# Patient Record
Sex: Female | Born: 1960 | Race: White | Hispanic: No | Marital: Married | Smoking: Former smoker
Health system: Southern US, Community
[De-identification: ages and names within clinical notes are randomized; demographics above are authoritative.]

## PROBLEM LIST (undated history)

## (undated) DIAGNOSIS — T7840XA Allergy, unspecified, initial encounter: Secondary | ICD-10-CM

## (undated) DIAGNOSIS — E079 Disorder of thyroid, unspecified: Secondary | ICD-10-CM

## (undated) DIAGNOSIS — E039 Hypothyroidism, unspecified: Secondary | ICD-10-CM

## (undated) DIAGNOSIS — C50919 Malignant neoplasm of unspecified site of unspecified female breast: Secondary | ICD-10-CM

## (undated) DIAGNOSIS — K219 Gastro-esophageal reflux disease without esophagitis: Secondary | ICD-10-CM

## (undated) DIAGNOSIS — F32A Depression, unspecified: Secondary | ICD-10-CM

## (undated) DIAGNOSIS — E119 Type 2 diabetes mellitus without complications: Secondary | ICD-10-CM

## (undated) DIAGNOSIS — M1990 Unspecified osteoarthritis, unspecified site: Secondary | ICD-10-CM

## (undated) DIAGNOSIS — F329 Major depressive disorder, single episode, unspecified: Secondary | ICD-10-CM

## (undated) DIAGNOSIS — F419 Anxiety disorder, unspecified: Secondary | ICD-10-CM

## (undated) HISTORY — DX: Disorder of thyroid, unspecified: E07.9

## (undated) HISTORY — DX: Allergy, unspecified, initial encounter: T78.40XA

## (undated) HISTORY — PX: BREAST SURGERY: SHX581

## (undated) HISTORY — DX: Depression, unspecified: F32.A

## (undated) HISTORY — PX: COLONOSCOPY: SHX174

## (undated) HISTORY — PX: TONSILLECTOMY: SUR1361

## (undated) HISTORY — PX: ABDOMINAL HYSTERECTOMY: SHX81

## (undated) HISTORY — DX: Type 2 diabetes mellitus without complications: E11.9

## (undated) HISTORY — DX: Malignant neoplasm of unspecified site of unspecified female breast: C50.919

## (undated) HISTORY — DX: Anxiety disorder, unspecified: F41.9

## (undated) HISTORY — PX: POLYPECTOMY: SHX149

## (undated) HISTORY — DX: Unspecified osteoarthritis, unspecified site: M19.90

## (undated) HISTORY — PX: BACK SURGERY: SHX140

---
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
## 1998-12-03 ENCOUNTER — Other Ambulatory Visit: Admission: RE | Admit: 1998-12-03 | Discharge: 1998-12-03 | Payer: Self-pay | Admitting: Family Medicine

## 1999-10-09 ENCOUNTER — Encounter: Payer: Self-pay | Admitting: Emergency Medicine

## 1999-10-09 ENCOUNTER — Emergency Department (HOSPITAL_COMMUNITY): Admission: EM | Admit: 1999-10-09 | Discharge: 1999-10-09 | Payer: Self-pay | Admitting: Emergency Medicine

## 2001-01-18 ENCOUNTER — Encounter: Admission: RE | Admit: 2001-01-18 | Discharge: 2001-01-18 | Payer: Self-pay | Admitting: Internal Medicine

## 2001-01-18 ENCOUNTER — Encounter: Payer: Self-pay | Admitting: Internal Medicine

## 2002-08-07 ENCOUNTER — Encounter: Payer: Self-pay | Admitting: Internal Medicine

## 2002-08-07 ENCOUNTER — Encounter: Admission: RE | Admit: 2002-08-07 | Discharge: 2002-08-07 | Payer: Self-pay | Admitting: Internal Medicine

## 2005-05-09 DIAGNOSIS — C50919 Malignant neoplasm of unspecified site of unspecified female breast: Secondary | ICD-10-CM

## 2005-05-09 HISTORY — DX: Malignant neoplasm of unspecified site of unspecified female breast: C50.919

## 2005-05-09 HISTORY — PX: MASTECTOMY: SHX3

## 2005-05-10 ENCOUNTER — Encounter: Admission: RE | Admit: 2005-05-10 | Discharge: 2005-05-10 | Payer: Self-pay | Admitting: Internal Medicine

## 2005-05-17 ENCOUNTER — Encounter: Admission: RE | Admit: 2005-05-17 | Discharge: 2005-05-17 | Payer: Self-pay | Admitting: Internal Medicine

## 2005-05-17 ENCOUNTER — Encounter (INDEPENDENT_AMBULATORY_CARE_PROVIDER_SITE_OTHER): Payer: Self-pay | Admitting: Diagnostic Radiology

## 2005-05-17 ENCOUNTER — Encounter (INDEPENDENT_AMBULATORY_CARE_PROVIDER_SITE_OTHER): Payer: Self-pay | Admitting: Specialist

## 2005-05-25 ENCOUNTER — Encounter: Admission: RE | Admit: 2005-05-25 | Discharge: 2005-05-25 | Payer: Self-pay | Admitting: General Surgery

## 2005-05-30 ENCOUNTER — Encounter: Admission: RE | Admit: 2005-05-30 | Discharge: 2005-05-30 | Payer: Self-pay | Admitting: General Surgery

## 2005-05-30 ENCOUNTER — Encounter (INDEPENDENT_AMBULATORY_CARE_PROVIDER_SITE_OTHER): Payer: Self-pay | Admitting: *Deleted

## 2005-06-21 ENCOUNTER — Inpatient Hospital Stay (HOSPITAL_COMMUNITY): Admission: RE | Admit: 2005-06-21 | Discharge: 2005-06-22 | Payer: Self-pay | Admitting: General Surgery

## 2005-06-21 ENCOUNTER — Encounter (INDEPENDENT_AMBULATORY_CARE_PROVIDER_SITE_OTHER): Payer: Self-pay | Admitting: *Deleted

## 2005-07-07 ENCOUNTER — Ambulatory Visit: Payer: Self-pay | Admitting: Oncology

## 2005-07-22 ENCOUNTER — Encounter (INDEPENDENT_AMBULATORY_CARE_PROVIDER_SITE_OTHER): Payer: Self-pay | Admitting: Specialist

## 2005-07-22 ENCOUNTER — Inpatient Hospital Stay (HOSPITAL_COMMUNITY): Admission: RE | Admit: 2005-07-22 | Discharge: 2005-07-24 | Payer: Self-pay | Admitting: Plastic Surgery

## 2005-08-29 ENCOUNTER — Ambulatory Visit: Payer: Self-pay | Admitting: Oncology

## 2005-08-29 LAB — CBC WITH DIFFERENTIAL/PLATELET
Basophils Absolute: 0 10*3/uL (ref 0.0–0.1)
Eosinophils Absolute: 0.1 10*3/uL (ref 0.0–0.5)
HCT: 39.3 % (ref 34.8–46.6)
HGB: 13.2 g/dL (ref 11.6–15.9)
MCH: 29.3 pg (ref 26.0–34.0)
MCV: 86.9 fL (ref 81.0–101.0)
NEUT#: 4.1 10*3/uL (ref 1.5–6.5)
NEUT%: 65.6 % (ref 39.6–76.8)
RDW: 12.3 % (ref 11.3–14.5)
lymph#: 1.6 10*3/uL (ref 0.9–3.3)

## 2005-08-29 LAB — COMPREHENSIVE METABOLIC PANEL
Albumin: 4.6 g/dL (ref 3.5–5.2)
BUN: 13 mg/dL (ref 6–23)
Calcium: 9.4 mg/dL (ref 8.4–10.5)
Chloride: 105 mEq/L (ref 96–112)
Creatinine, Ser: 0.8 mg/dL (ref 0.4–1.2)
Glucose, Bld: 98 mg/dL (ref 70–99)
Potassium: 4.2 mEq/L (ref 3.5–5.3)

## 2005-11-23 ENCOUNTER — Ambulatory Visit: Payer: Self-pay | Admitting: Oncology

## 2006-02-10 ENCOUNTER — Ambulatory Visit: Payer: Self-pay | Admitting: Oncology

## 2006-03-31 ENCOUNTER — Ambulatory Visit: Payer: Self-pay | Admitting: Family Medicine

## 2006-05-11 ENCOUNTER — Encounter: Admission: RE | Admit: 2006-05-11 | Discharge: 2006-05-11 | Payer: Self-pay | Admitting: Family Medicine

## 2006-05-19 ENCOUNTER — Ambulatory Visit: Payer: Self-pay | Admitting: Family Medicine

## 2006-05-19 LAB — CONVERTED CEMR LAB
Albumin: 4 g/dL (ref 3.5–5.2)
BUN: 11 mg/dL (ref 6–23)
Basophils Absolute: 0 10*3/uL (ref 0.0–0.1)
Calcium: 9.6 mg/dL (ref 8.4–10.5)
Eosinophil percent: 1.5 % (ref 0.0–5.0)
GFR calc non Af Amer: 82 mL/min
HCT: 38.6 % (ref 36.0–46.0)
MCHC: 33.6 g/dL (ref 30.0–36.0)
Monocytes Relative: 7.2 % (ref 3.0–11.0)
Neutro Abs: 5.2 10*3/uL (ref 1.4–7.7)
Neutrophils Relative %: 66.6 % (ref 43.0–77.0)
Potassium: 4 meq/L (ref 3.5–5.1)
Sodium: 143 meq/L (ref 135–145)
Total Bilirubin: 0.8 mg/dL (ref 0.3–1.2)
Total Protein: 6.7 g/dL (ref 6.0–8.3)
WBC: 7.6 10*3/uL (ref 4.5–10.5)

## 2006-06-07 ENCOUNTER — Encounter: Admission: RE | Admit: 2006-06-07 | Discharge: 2006-06-07 | Payer: Self-pay | Admitting: Oncology

## 2006-07-24 ENCOUNTER — Other Ambulatory Visit: Admission: RE | Admit: 2006-07-24 | Discharge: 2006-07-24 | Payer: Self-pay | Admitting: Family Medicine

## 2006-07-24 ENCOUNTER — Ambulatory Visit: Payer: Self-pay | Admitting: Family Medicine

## 2006-07-24 ENCOUNTER — Encounter (INDEPENDENT_AMBULATORY_CARE_PROVIDER_SITE_OTHER): Payer: Self-pay | Admitting: Family Medicine

## 2006-07-24 LAB — CONVERTED CEMR LAB
HDL: 53.6 mg/dL (ref >39.0)
Triglycerides: 64 mg/dL (ref 0–149)
VLDL: 13 mg/dL (ref 0–40)

## 2006-08-02 ENCOUNTER — Ambulatory Visit: Payer: Self-pay | Admitting: Family Medicine

## 2006-08-02 LAB — CONVERTED CEMR LAB: T3, Free: 3.5 pg/mL (ref 2.3–4.2)

## 2006-08-31 ENCOUNTER — Ambulatory Visit: Payer: Self-pay | Admitting: Endocrinology

## 2006-09-06 DIAGNOSIS — Z9010 Acquired absence of unspecified breast and nipple: Secondary | ICD-10-CM

## 2006-09-06 DIAGNOSIS — Z9079 Acquired absence of other genital organ(s): Secondary | ICD-10-CM | POA: Insufficient documentation

## 2006-09-06 DIAGNOSIS — F411 Generalized anxiety disorder: Secondary | ICD-10-CM

## 2006-09-11 ENCOUNTER — Encounter: Admission: RE | Admit: 2006-09-11 | Discharge: 2006-09-11 | Payer: Self-pay | Admitting: Endocrinology

## 2006-09-12 ENCOUNTER — Ambulatory Visit: Payer: Self-pay | Admitting: Oncology

## 2006-09-14 LAB — COMPREHENSIVE METABOLIC PANEL
AST: 16 U/L (ref 0–37)
Albumin: 4.4 g/dL (ref 3.5–5.2)
BUN: 10 mg/dL (ref 6–23)
CO2: 23 mEq/L (ref 19–32)
Calcium: 9.1 mg/dL (ref 8.4–10.5)
Chloride: 108 mEq/L (ref 96–112)
Glucose, Bld: 95 mg/dL (ref 70–99)
Potassium: 3.9 mEq/L (ref 3.5–5.3)

## 2006-09-14 LAB — CBC WITH DIFFERENTIAL/PLATELET
Basophils Absolute: 0 10*3/uL (ref 0.0–0.1)
EOS%: 1.9 % (ref 0.0–7.0)
Eosinophils Absolute: 0.1 10*3/uL (ref 0.0–0.5)
HCT: 34.8 % (ref 34.8–46.6)
HGB: 12.2 g/dL (ref 11.6–15.9)
MONO#: 0.7 10*3/uL (ref 0.1–0.9)
NEUT#: 4.6 10*3/uL (ref 1.5–6.5)
RDW: 12.3 % (ref 11.3–14.5)
WBC: 7.1 10*3/uL (ref 3.9–10.0)
lymph#: 1.7 10*3/uL (ref 0.9–3.3)

## 2006-09-14 LAB — CANCER ANTIGEN 27.29: CA 27.29: 19 U/mL (ref 0–39)

## 2006-09-14 LAB — LACTATE DEHYDROGENASE: LDH: 132 U/L (ref 94–250)

## 2006-09-29 ENCOUNTER — Encounter: Admission: RE | Admit: 2006-09-29 | Discharge: 2006-09-29 | Payer: Self-pay | Admitting: Endocrinology

## 2006-10-31 ENCOUNTER — Ambulatory Visit: Payer: Self-pay | Admitting: Endocrinology

## 2006-10-31 LAB — CONVERTED CEMR LAB
Free T4: 2 ng/dL — ABNORMAL HIGH (ref 0.6–1.6)
TSH: 0.05 microintl units/mL — ABNORMAL LOW (ref 0.35–5.50)

## 2006-11-28 ENCOUNTER — Ambulatory Visit: Payer: Self-pay | Admitting: Endocrinology

## 2006-11-28 LAB — CONVERTED CEMR LAB: TSH: 0.08 microintl units/mL — ABNORMAL LOW (ref 0.35–5.50)

## 2006-12-26 ENCOUNTER — Ambulatory Visit: Payer: Self-pay | Admitting: Endocrinology

## 2007-01-06 ENCOUNTER — Encounter (INDEPENDENT_AMBULATORY_CARE_PROVIDER_SITE_OTHER): Payer: Self-pay | Admitting: Family Medicine

## 2007-02-03 ENCOUNTER — Encounter: Payer: Self-pay | Admitting: *Deleted

## 2007-02-03 DIAGNOSIS — Z853 Personal history of malignant neoplasm of breast: Secondary | ICD-10-CM | POA: Insufficient documentation

## 2007-02-03 DIAGNOSIS — F329 Major depressive disorder, single episode, unspecified: Secondary | ICD-10-CM | POA: Insufficient documentation

## 2007-02-07 ENCOUNTER — Ambulatory Visit: Payer: Self-pay | Admitting: Endocrinology

## 2007-02-07 ENCOUNTER — Encounter: Payer: Self-pay | Admitting: Endocrinology

## 2007-02-09 ENCOUNTER — Ambulatory Visit: Payer: Self-pay | Admitting: Internal Medicine

## 2007-02-20 ENCOUNTER — Encounter: Payer: Self-pay | Admitting: Endocrinology

## 2007-03-08 ENCOUNTER — Ambulatory Visit: Payer: Self-pay | Admitting: Oncology

## 2007-03-15 ENCOUNTER — Telehealth (INDEPENDENT_AMBULATORY_CARE_PROVIDER_SITE_OTHER): Payer: Self-pay | Admitting: *Deleted

## 2007-04-03 ENCOUNTER — Ambulatory Visit: Payer: Self-pay | Admitting: Family Medicine

## 2007-05-28 ENCOUNTER — Encounter: Admission: RE | Admit: 2007-05-28 | Discharge: 2007-05-28 | Payer: Self-pay | Admitting: Oncology

## 2007-06-04 ENCOUNTER — Encounter: Admission: RE | Admit: 2007-06-04 | Discharge: 2007-06-04 | Payer: Self-pay | Admitting: Oncology

## 2007-08-08 ENCOUNTER — Ambulatory Visit: Payer: Self-pay | Admitting: Endocrinology

## 2007-08-08 LAB — CONVERTED CEMR LAB
Bilirubin Urine: NEGATIVE
Glucose, Urine, Semiquant: NEGATIVE
Nitrite: NEGATIVE
Specific Gravity, Urine: <1.005
WBC Urine, dipstick: NEGATIVE

## 2007-08-13 ENCOUNTER — Telehealth (INDEPENDENT_AMBULATORY_CARE_PROVIDER_SITE_OTHER): Payer: Self-pay | Admitting: *Deleted

## 2007-08-13 ENCOUNTER — Ambulatory Visit: Payer: Self-pay | Admitting: Endocrinology

## 2007-08-13 DIAGNOSIS — N3000 Acute cystitis without hematuria: Secondary | ICD-10-CM

## 2007-08-14 LAB — CONVERTED CEMR LAB
Ketones, ur: NEGATIVE mg/dL
Specific Gravity, Urine: 1.015 (ref 1.000–1.03)
Total Protein, Urine: NEGATIVE mg/dL
Urobilinogen, UA: 0.2 (ref 0.0–1.0)

## 2007-08-27 ENCOUNTER — Telehealth (INDEPENDENT_AMBULATORY_CARE_PROVIDER_SITE_OTHER): Payer: Self-pay | Admitting: *Deleted

## 2007-08-31 ENCOUNTER — Telehealth (INDEPENDENT_AMBULATORY_CARE_PROVIDER_SITE_OTHER): Payer: Self-pay | Admitting: *Deleted

## 2007-09-10 ENCOUNTER — Ambulatory Visit: Payer: Self-pay | Admitting: Internal Medicine

## 2007-09-10 ENCOUNTER — Telehealth (INDEPENDENT_AMBULATORY_CARE_PROVIDER_SITE_OTHER): Payer: Self-pay | Admitting: *Deleted

## 2007-09-10 DIAGNOSIS — L259 Unspecified contact dermatitis, unspecified cause: Secondary | ICD-10-CM

## 2007-09-12 ENCOUNTER — Ambulatory Visit: Payer: Self-pay | Admitting: Oncology

## 2007-09-14 LAB — CBC WITH DIFFERENTIAL/PLATELET
Basophils Absolute: 0.1 10*3/uL (ref 0.0–0.1)
Eosinophils Absolute: 0.1 10*3/uL (ref 0.0–0.5)
HGB: 13.2 g/dL (ref 11.6–15.9)
LYMPH%: 11.4 % — ABNORMAL LOW (ref 14.0–48.0)
MCV: 87.2 fL (ref 81.0–101.0)
MONO%: 4.9 % (ref 0.0–13.0)
NEUT#: 9 10*3/uL — ABNORMAL HIGH (ref 1.5–6.5)
Platelets: 291 10*3/uL (ref 145–400)

## 2007-09-14 LAB — COMPREHENSIVE METABOLIC PANEL
Albumin: 4.8 g/dL (ref 3.5–5.2)
Alkaline Phosphatase: 85 U/L (ref 39–117)
BUN: 13 mg/dL (ref 6–23)
CO2: 24 mEq/L (ref 19–32)
Glucose, Bld: 136 mg/dL — ABNORMAL HIGH (ref 70–99)
Potassium: 4.2 mEq/L (ref 3.5–5.3)
Total Bilirubin: 0.7 mg/dL (ref 0.3–1.2)

## 2007-09-14 LAB — LACTATE DEHYDROGENASE: LDH: 182 U/L (ref 94–250)

## 2007-09-14 LAB — CANCER ANTIGEN 27.29: CA 27.29: 31 U/mL (ref 0–39)

## 2007-09-21 ENCOUNTER — Encounter: Payer: Self-pay | Admitting: Endocrinology

## 2007-09-24 ENCOUNTER — Telehealth (INDEPENDENT_AMBULATORY_CARE_PROVIDER_SITE_OTHER): Payer: Self-pay | Admitting: *Deleted

## 2007-09-25 ENCOUNTER — Ambulatory Visit: Payer: Self-pay | Admitting: Internal Medicine

## 2007-09-25 ENCOUNTER — Encounter (INDEPENDENT_AMBULATORY_CARE_PROVIDER_SITE_OTHER): Payer: Self-pay | Admitting: *Deleted

## 2007-11-28 ENCOUNTER — Telehealth (INDEPENDENT_AMBULATORY_CARE_PROVIDER_SITE_OTHER): Payer: Self-pay | Admitting: *Deleted

## 2007-12-03 ENCOUNTER — Encounter: Admission: RE | Admit: 2007-12-03 | Discharge: 2007-12-03 | Payer: Self-pay | Admitting: Oncology

## 2007-12-27 ENCOUNTER — Ambulatory Visit: Payer: Self-pay | Admitting: Family Medicine

## 2008-02-11 ENCOUNTER — Ambulatory Visit: Payer: Self-pay | Admitting: Endocrinology

## 2008-02-11 DIAGNOSIS — L509 Urticaria, unspecified: Secondary | ICD-10-CM | POA: Insufficient documentation

## 2008-02-11 DIAGNOSIS — E890 Postprocedural hypothyroidism: Secondary | ICD-10-CM

## 2008-02-11 DIAGNOSIS — E039 Hypothyroidism, unspecified: Secondary | ICD-10-CM | POA: Insufficient documentation

## 2008-03-20 ENCOUNTER — Telehealth (INDEPENDENT_AMBULATORY_CARE_PROVIDER_SITE_OTHER): Payer: Self-pay | Admitting: *Deleted

## 2008-05-28 ENCOUNTER — Encounter: Admission: RE | Admit: 2008-05-28 | Discharge: 2008-05-28 | Payer: Self-pay | Admitting: Oncology

## 2008-06-23 ENCOUNTER — Telehealth: Payer: Self-pay | Admitting: Family Medicine

## 2008-07-31 ENCOUNTER — Encounter: Payer: Self-pay | Admitting: Family Medicine

## 2008-07-31 ENCOUNTER — Telehealth (INDEPENDENT_AMBULATORY_CARE_PROVIDER_SITE_OTHER): Payer: Self-pay | Admitting: *Deleted

## 2008-08-14 ENCOUNTER — Ambulatory Visit: Payer: Self-pay | Admitting: Family Medicine

## 2008-08-18 ENCOUNTER — Telehealth (INDEPENDENT_AMBULATORY_CARE_PROVIDER_SITE_OTHER): Payer: Self-pay | Admitting: *Deleted

## 2008-08-27 ENCOUNTER — Ambulatory Visit: Payer: Self-pay | Admitting: Family Medicine

## 2008-08-31 LAB — CONVERTED CEMR LAB
AST: 20 units/L (ref 0–37)
Albumin: 4 g/dL (ref 3.5–5.2)
Basophils Relative: 0 % (ref 0.0–3.0)
CO2: 30 meq/L (ref 19–32)
Chloride: 104 meq/L (ref 96–112)
Creatinine, Ser: 0.9 mg/dL (ref 0.4–1.2)
Eosinophils Absolute: 0.3 10*3/uL (ref 0.0–0.7)
Eosinophils Relative: 2.7 % (ref 0.0–5.0)
GFR calc non Af Amer: 70.44 mL/min (ref >60)
Glucose, Bld: 89 mg/dL (ref 70–99)
HDL: 55.7 mg/dL (ref >39.00)
Hemoglobin: 13.3 g/dL (ref 12.0–15.0)
LDL Cholesterol: 101 mg/dL — ABNORMAL HIGH (ref 0–99)
Neutro Abs: 6.7 10*3/uL (ref 1.4–7.7)
Neutrophils Relative %: 62.2 % (ref 43.0–77.0)
Platelets: 293 10*3/uL (ref 150.0–400.0)
RDW: 12.8 % (ref 11.5–14.6)
Sodium: 141 meq/L (ref 135–145)
Total Bilirubin: 1 mg/dL (ref 0.3–1.2)

## 2008-09-01 ENCOUNTER — Encounter (INDEPENDENT_AMBULATORY_CARE_PROVIDER_SITE_OTHER): Payer: Self-pay | Admitting: *Deleted

## 2008-09-08 ENCOUNTER — Ambulatory Visit: Payer: Self-pay | Admitting: Gastroenterology

## 2008-09-17 ENCOUNTER — Ambulatory Visit: Payer: Self-pay | Admitting: Oncology

## 2008-09-22 ENCOUNTER — Encounter: Payer: Self-pay | Admitting: Gastroenterology

## 2008-09-22 ENCOUNTER — Ambulatory Visit: Payer: Self-pay | Admitting: Gastroenterology

## 2008-09-26 ENCOUNTER — Encounter: Payer: Self-pay | Admitting: Gastroenterology

## 2008-09-30 ENCOUNTER — Encounter: Payer: Self-pay | Admitting: Endocrinology

## 2008-09-30 LAB — CBC WITH DIFFERENTIAL/PLATELET
BASO%: 0.3 % (ref 0.0–2.0)
LYMPH%: 20.9 % (ref 14.0–49.7)
MCHC: 34.3 g/dL (ref 31.5–36.0)
MONO#: 0.5 10*3/uL (ref 0.1–0.9)
MONO%: 4.7 % (ref 0.0–14.0)
Platelets: 282 10*3/uL (ref 145–400)
RBC: 4.31 10*6/uL (ref 3.70–5.45)
WBC: 10.3 10*3/uL (ref 3.9–10.3)

## 2008-09-30 LAB — COMPREHENSIVE METABOLIC PANEL
AST: 19 U/L (ref 0–37)
Alkaline Phosphatase: 70 U/L (ref 39–117)
BUN: 12 mg/dL (ref 6–23)
Creatinine, Ser: 0.91 mg/dL (ref 0.40–1.20)
Potassium: 4.2 mEq/L (ref 3.5–5.3)
Total Bilirubin: 0.5 mg/dL (ref 0.3–1.2)

## 2008-10-07 ENCOUNTER — Ambulatory Visit: Payer: Self-pay | Admitting: Endocrinology

## 2008-10-08 LAB — CONVERTED CEMR LAB
TSH: 3.46 microintl units/mL (ref 0.35–5.50)
Vitamin B-12: 568 pg/mL (ref 211–911)

## 2008-10-09 LAB — CONVERTED CEMR LAB
Calcium, Total (PTH): 9.5 mg/dL (ref 8.4–10.5)
PTH: 30.7 pg/mL (ref 14.0–72.0)

## 2008-12-03 ENCOUNTER — Telehealth (INDEPENDENT_AMBULATORY_CARE_PROVIDER_SITE_OTHER): Payer: Self-pay | Admitting: *Deleted

## 2008-12-29 ENCOUNTER — Telehealth (INDEPENDENT_AMBULATORY_CARE_PROVIDER_SITE_OTHER): Payer: Self-pay | Admitting: *Deleted

## 2009-04-07 ENCOUNTER — Telehealth: Payer: Self-pay | Admitting: Family Medicine

## 2009-04-14 ENCOUNTER — Telehealth (INDEPENDENT_AMBULATORY_CARE_PROVIDER_SITE_OTHER): Payer: Self-pay | Admitting: *Deleted

## 2009-05-05 ENCOUNTER — Telehealth: Payer: Self-pay | Admitting: Internal Medicine

## 2009-05-12 ENCOUNTER — Ambulatory Visit: Payer: Self-pay | Admitting: Endocrinology

## 2009-05-15 ENCOUNTER — Telehealth: Payer: Self-pay | Admitting: Endocrinology

## 2009-05-29 ENCOUNTER — Encounter: Admission: RE | Admit: 2009-05-29 | Discharge: 2009-05-29 | Payer: Self-pay | Admitting: Oncology

## 2009-07-08 ENCOUNTER — Telehealth: Payer: Self-pay | Admitting: Endocrinology

## 2009-09-22 ENCOUNTER — Ambulatory Visit: Payer: Self-pay | Admitting: Oncology

## 2009-09-24 LAB — COMPREHENSIVE METABOLIC PANEL
BUN: 8 mg/dL (ref 6–23)
CO2: 27 mEq/L (ref 19–32)
Calcium: 9.4 mg/dL (ref 8.4–10.5)
Chloride: 105 mEq/L (ref 96–112)
Creatinine, Ser: 0.94 mg/dL (ref 0.40–1.20)

## 2009-09-24 LAB — CBC WITH DIFFERENTIAL/PLATELET
Basophils Absolute: 0.1 10*3/uL (ref 0.0–0.1)
HCT: 36.7 % (ref 34.8–46.6)
HGB: 12.6 g/dL (ref 11.6–15.9)
LYMPH%: 22.5 % (ref 14.0–49.7)
MCH: 29.8 pg (ref 25.1–34.0)
MONO#: 0.5 10*3/uL (ref 0.1–0.9)
NEUT%: 69.1 % (ref 38.4–76.8)
Platelets: 274 10*3/uL (ref 145–400)
WBC: 9.5 10*3/uL (ref 3.9–10.3)
lymph#: 2.1 10*3/uL (ref 0.9–3.3)

## 2009-09-25 LAB — LACTATE DEHYDROGENASE: LDH: 178 U/L (ref 94–250)

## 2009-09-25 LAB — CANCER ANTIGEN 27.29: CA 27.29: 25 U/mL (ref 0–39)

## 2009-09-25 LAB — VITAMIN D 25 HYDROXY (VIT D DEFICIENCY, FRACTURES): Vit D, 25-Hydroxy: 28 ng/mL — ABNORMAL LOW (ref 30–89)

## 2009-09-30 ENCOUNTER — Encounter: Payer: Self-pay | Admitting: Endocrinology

## 2009-10-14 ENCOUNTER — Telehealth (INDEPENDENT_AMBULATORY_CARE_PROVIDER_SITE_OTHER): Payer: Self-pay | Admitting: *Deleted

## 2009-11-10 ENCOUNTER — Ambulatory Visit: Payer: Self-pay | Admitting: Family Medicine

## 2009-12-03 ENCOUNTER — Encounter: Payer: Self-pay | Admitting: Family Medicine

## 2009-12-17 ENCOUNTER — Encounter: Payer: Self-pay | Admitting: Family Medicine

## 2010-05-30 ENCOUNTER — Encounter: Payer: Self-pay | Admitting: Oncology

## 2010-05-31 ENCOUNTER — Encounter
Admission: RE | Admit: 2010-05-31 | Discharge: 2010-05-31 | Payer: Self-pay | Source: Home / Self Care | Attending: Oncology | Admitting: Oncology

## 2010-06-06 LAB — CONVERTED CEMR LAB
Bilirubin Urine: NEGATIVE
Glucose, Urine, Semiquant: NEGATIVE
Nitrite: NEGATIVE

## 2010-06-10 NOTE — Progress Notes (Signed)
Summary: dosage increase  Phone Note Call from Patient Call back at Home Phone [phone] Call back at Work Phone [phone]   Caller: Patient Summary of Call: pt called stating that Effexor is not as effective as Prozac was. pt is requesting to have dosage increase as discussed at last OV. Initial call taken by: Margaret Pyle, CMA,  July 08, 2009 9:09 AM  Follow-up for Phone Call        increase to 3x75 mg once daily.  i sent rx. Follow-up by: Minus Breeding MD,  July 08, 2009 9:15 AM  Additional Follow-up for Phone Call Additional follow up Details #1::        pt informed Additional Follow-up by: Margaret Pyle, CMA,  July 08, 2009 9:19 AM    New/Updated Medications: VENLAFAXINE HCL 75 MG XR24H-CAP (VENLAFAXINE HCL) 3 tabs once daily Prescriptions: VENLAFAXINE HCL 75 MG XR24H-CAP (VENLAFAXINE HCL) 3 tabs once daily  #90 x 11   Entered and Authorized by:   Minus Breeding MD   Signed by:   Minus Breeding MD on 07/08/2009   Method used:   Electronically to        [facility]* (retail)       [address]       [place], [place]  [address]       Ph: [phone]       Fax: [phone]   RxID:   [id number]

## 2010-06-10 NOTE — Progress Notes (Signed)
Summary: Labs  Phone Note Call from Patient Call back at Home Phone [phone]   Summary of Call: Patient called requesting a copy of her labs be mailed to her. Done, and patient notified. Initial call taken by: Lucious Groves,  May 15, 2009 10:13 AM

## 2010-06-10 NOTE — Assessment & Plan Note (Signed)
Summary: rto med check/cbs   Vital Signs:  Patient profile:   50 year old female Menstrual status:  hysterectomy Height:      67.50 inches (171.45 cm) Weight:      211.50 pounds (96.14 kg) BMI:     32.75 O2 Sat:      98 % on Room air Temp:     98.8 degrees F (37.11 degrees C) oral Pulse rate:   70 / minute BP sitting:   118 / 74  (left arm)  Vitals Entered By: Lucious Groves (November 10, 2009 2:09 PM)  O2 Flow:  Room air CC: Return ov/med check./kb Is Patient Diabetic? No Pain Assessment Patient in pain? no      Comments Patient notes that she did not like Venlafaxine and went back to Prozac./kb   History of Present Illness: Pt here to f/u prozac--- she did not like the effexor at all so she went back to prozac.   Pt doing well on prozac.  No complaints.    Current Medications (verified): 1)  Alprazolam 0.25 Mg Tabs (Alprazolam) .... Take One Tablet Three Times A Day As Needed Anxiety or Aleep 2)  Cvs Omeprazole 20 Mg  Tbec (Omeprazole) .... Qd 3)  Valtrex 500 Mg  Tabs (Valacyclovir Hcl) .Marland Kitchen.. 1 By Mouth Once Daily Prn 4)  Lidex 0.05 % Crea (Fluocinonide) .... Three Times A Day As Needed Rash 5)  Hydroxyzine Hcl 25 Mg Tabs (Hydroxyzine Hcl) .Marland Kitchen.. 1 By Mouth Every 6-8 Hours As Needed Itching 6)  Synthroid 125 Mcg Tabs (Levothyroxine Sodium) .... Take One Tablet By Mouth Once Daily. 7)  Prozac 40 Mg Caps (Fluoxetine Hcl) .Marland Kitchen.. 1 By Mouth Qd  Allergies (verified): 1)  ! Sulfa 2)  Sulfamethoxazole (Sulfamethoxazole)  Physical Exam  General:  Well-developed,well-nourished,in no acute distress; alert,appropriate and cooperative throughout examination Psych:  Oriented X3, normally interactive, good eye contact, not anxious appearing, not depressed appearing, not agitated, not suicidal, and not homicidal.     Impression & Recommendations:  Problem # 1:  DEPRESSION (ICD-311) Assessment Improved  The following medications were removed from the medication list:    Venlafaxine Hcl  75 Mg Xr24h-cap (Venlafaxine hcl) .Marland KitchenMarland KitchenMarland KitchenMarland Kitchen 3 tabs once daily Her updated medication list for this problem includes:    Alprazolam 0.25 Mg Tabs (Alprazolam) .Marland Kitchen... Take one tablet three times a day as needed anxiety or aleep    Hydroxyzine Hcl 25 Mg Tabs (Hydroxyzine hcl) .Marland Kitchen... 1 by mouth every 6-8 hours as needed itching    Prozac 40 Mg Caps (Fluoxetine hcl) .Marland Kitchen... 1 by mouth qd  Complete Medication List: 1)  Alprazolam 0.25 Mg Tabs (Alprazolam) .... Take one tablet three times a day as needed anxiety or aleep 2)  Cvs Omeprazole 20 Mg Tbec (Omeprazole) .... Qd 3)  Valtrex 500 Mg Tabs (Valacyclovir hcl) .Marland Kitchen.. 1 by mouth once daily prn 4)  Lidex 0.05 % Crea (Fluocinonide) .... Three times a day as needed rash 5)  Hydroxyzine Hcl 25 Mg Tabs (Hydroxyzine hcl) .Marland Kitchen.. 1 by mouth every 6-8 hours as needed itching 6)  Synthroid 125 Mcg Tabs (Levothyroxine sodium) .... Take one tablet by mouth once daily. 7)  Prozac 40 Mg Caps (Fluoxetine hcl) .Marland Kitchen.. 1 by mouth qd Prescriptions: PROZAC 40 MG CAPS (FLUOXETINE HCL) 1 by mouth qd  #30 x 5   Entered and Authorized by:   Loreen Freud DO   Signed by:   Loreen Freud DO on 11/10/2009   Method used:   Electronically to  [facility] [address] [id number]* (retail)       [address]       [place], [place]  [address]       Ph: [phone]       Fax: [phone]   RxID:   [id number]

## 2010-06-10 NOTE — Letter (Signed)
[facility]   Imported By: Sherian Rein 11/11/2009 10:26:23  _____________________________________________________________________  External Attachment:    Type:   Image     Comment:   External Document

## 2010-06-10 NOTE — Letter (Signed)
Summary: [facility]  [facility]   Imported By: Lanelle Bal 12/16/2009 14:12:17  _____________________________________________________________________  External Attachment:    Type:   Image     Comment:   External Document

## 2010-06-10 NOTE — Letter (Signed)
Summary: [facility]  [facility]   Imported By: Lanelle Bal 12/31/2009 12:49:25  _____________________________________________________________________  External Attachment:    Type:   Image     Comment:   External Document

## 2010-06-10 NOTE — Progress Notes (Signed)
SummaryRachel Kelley  Phone Note Outgoing Call   Call placed by: Army Fossa CMA,  October 14, 2009 10:31 AM Reason for Call: Confirm/change Appt Summary of Call: Pt needs to schedule OV. Army Fossa CMA  October 14, 2009 10:31 AM   Follow-up for Phone Call        ov scheduled [phone]  .Marland KitchenOkey Regal Spring  October 14, 2009 4:07 PM

## 2010-06-10 NOTE — Assessment & Plan Note (Signed)
Summary: 6 MTH FU  $50   STC  rs'd cd    Vital Signs:  Patient profile:   50 year old female Menstrual status:  hysterectomy Height:      67.50 inches (171.45 cm) Weight:      208 pounds (94.55 kg) O2 Sat:      99 % on Room air Temp:     97.8 degrees F (36.56 degrees C) oral Pulse rate:   66 / minute BP sitting:   122 / 80  (left arm) Cuff size:   large  Vitals Entered By: Josph Macho CMA (May 12, 2009 8:57 AM)  O2 Flow:  Room air CC: 6 month follow up/ pt states she doesn't want the flu vax/ CF Is Patient Diabetic? No   Primary Provider:  Laury Axon  CC:  6 month follow up/ pt states she doesn't want the flu vax/ CF.  History of Present Illness: pt states she feels well in general, except for insomnia and difficulty with weight loss.   she says it now requires 2x0.25 mg xanax for sleep.  she feels anxiety is driving the insomnia.  she denies associated tremor of the hands.  sxs have been present x many years. she takes synthroid as rx'ed.  Current Medications (verified): 1)  Alprazolam 0.25 Mg Tabs (Alprazolam) .... Take One Tablet Twice Daily. 2)  Cvs Omeprazole 20 Mg  Tbec (Omeprazole) .... Qd 3)  Valtrex 500 Mg  Tabs (Valacyclovir Hcl) .Marland Kitchen.. 1 By Mouth Once Daily Prn 4)  Prozac 40 Mg Caps (Fluoxetine Hcl) .... Qd 5)  Lidex 0.05 % Crea (Fluocinonide) .... Three Times A Day As Needed Rash 6)  Hydroxyzine Hcl 25 Mg Tabs (Hydroxyzine Hcl) .Marland Kitchen.. 1 By Mouth Every 6-8 Hours As Needed Itching 7)  Synthroid 125 Mcg Tabs (Levothyroxine Sodium) .... Take One Tablet By Mouth Once Daily.  Allergies (verified): 1)  ! Sulfa 2)  Sulfamethoxazole (Sulfamethoxazole)  Past History:  Past Medical History: Last updated: 02/03/2007 Anxiety Breast cancer, hx of Depression Hypothyroidism  Review of Systems  The patient denies chest pain and dyspnea on exertion.    Physical Exam  General:  normal appearance.   Neck:  no goiter Additional Exam:  FastTSH                   1.89  uIU/mL      Impression & Recommendations:  Problem # 1:  HYPOTHYROIDISM, POST-RADIATION (ICD-244.1) well-replaced  Problem # 2:  insomnia due to #3  Problem # 3:  ANXIETY (ICD-300.00) needs increased rx  Medications Added to Medication List This Visit: 1)  Alprazolam 0.25 Mg Tabs (Alprazolam) .... Take one tablet three times a day as needed anxiety or aleep 2)  Venlafaxine Hcl 150 Mg Xr24h-cap (Venlafaxine hcl) .Marland Kitchen.. 1 qd  Other Orders: TLB-TSH (Thyroid Stimulating Hormone) (84443-TSH) Est. Patient Level IV ([address])  Patient Instructions: 1)  cc dr Donnie Coffin 2)  change prozac to effexor-xr 150 mg once daily.  if necessary, we can increase this. 3)  tests are being ordered for you today.  a few days after the test(s), please call [phone] to hear your test results. 4)  increase alprazolam to 0.25 mg, 1 three times a day as needed anxiety or sleep. 5)  return as needed Prescriptions: ALPRAZOLAM 0.25 MG TABS (ALPRAZOLAM) Take one tablet three times a day as needed anxiety or aleep  #90 x 5   Entered and Authorized by:   Minus Breeding MD   Signed  by:   Minus Breeding MD on 05/12/2009   Method used:   Print then Give to Patient   RxID:   [id number] VENLAFAXINE HCL 150 MG XR24H-CAP (VENLAFAXINE HCL) 1 qd  #30 x 11   Entered and Authorized by:   Minus Breeding MD   Signed by:   Minus Breeding MD on 05/12/2009   Method used:   Electronically to        [facility]* (retail)       [address]       [place], [place]  [address]       Ph: [phone]       Fax: [phone]   RxID:   [id number]

## 2010-06-28 ENCOUNTER — Ambulatory Visit: Payer: Self-pay | Admitting: Endocrinology

## 2010-07-08 ENCOUNTER — Other Ambulatory Visit: Payer: Self-pay | Admitting: Endocrinology

## 2010-07-08 ENCOUNTER — Ambulatory Visit (INDEPENDENT_AMBULATORY_CARE_PROVIDER_SITE_OTHER): Payer: BC Managed Care – PPO | Admitting: Endocrinology

## 2010-07-08 ENCOUNTER — Other Ambulatory Visit: Payer: BC Managed Care – PPO

## 2010-07-08 ENCOUNTER — Encounter: Payer: Self-pay | Admitting: Endocrinology

## 2010-07-08 DIAGNOSIS — E890 Postprocedural hypothyroidism: Secondary | ICD-10-CM

## 2010-07-08 LAB — TSH: TSH: 1.07 u[IU]/mL (ref 0.35–5.50)

## 2010-07-12 ENCOUNTER — Telehealth: Payer: Self-pay | Admitting: Endocrinology

## 2010-07-20 NOTE — Progress Notes (Signed)
Summary: Rx refill req  Phone Note Refill Request Message from:  Patient on July 12, 2010 2:12 PM  Refills Requested: Medication #1:  ALPRAZOLAM 0.25 MG TABS Take one tablet three times a day as needed anxiety or aleep   Dosage confirmed as above?Dosage Confirmed   Supply Requested: 3 months  Method Requested: Electronic Initial call taken by: Margaret Pyle, CMA,  July 12, 2010 2:12 PM    Prescriptions: ALPRAZOLAM 0.25 MG TABS (ALPRAZOLAM) Take one tablet three times a day as needed anxiety or aleep  #90 x 3   Entered and Authorized by:   Minus Breeding MD   Signed by:   Minus Breeding MD on 07/12/2010   Method used:   Print then Give to Patient   RxID:   [id number]  i printed  Rx faxed to pharmacy Margaret Pyle, CMA  July 12, 2010 4:15 PM

## 2010-07-20 NOTE — Assessment & Plan Note (Signed)
Summary: med refills/check up/bcbs/#/cd   Vital Signs:  Patient profile:   50 year old female Menstrual status:  hysterectomy Height:      67.50 inches (171.45 cm) Weight:      209 pounds (95.00 kg) BMI:     32.37 O2 Sat:      97 % on Room air Temp:     99.1 degrees F (37.28 degrees C) oral Pulse rate:   72 / minute Pulse rhythm:   regular BP sitting:   122 / 78  (left arm) Cuff size:   large  Vitals Entered By: Brenton Grills CMA (AAMA) (July 08, 2010 8:21 AM)  O2 Flow:  Room air CC: Follow-up visit/refills on meds/aj Is Patient Diabetic? No   Primary Provider:  Laury Axon  CC:  Follow-up visit/refills on meds/aj.  History of Present Illness: the status of at least 3 ongoing medical problems is addressed today: pt has hypothyroidism since i-131 rx for hyperthyroidism due to grave's dz in 2008.  she takes synthroid as rx'ed.  she has some fatigue.   depression:  she says the prozac works better than the [employer].   insomnia:  she says this is well-controlled. she now takes xanax approx 5 doses per week.     Current Medications (verified): 1)  Alprazolam 0.25 Mg Tabs (Alprazolam) .... Take One Tablet Three Times A Day As Needed Anxiety or Aleep 2)  Cvs Omeprazole 20 Mg  Tbec (Omeprazole) .... Qd 3)  Valtrex 500 Mg  Tabs (Valacyclovir Hcl) .Marland Kitchen.. 1 By Mouth Once Daily Prn 4)  Lidex 0.05 % Crea (Fluocinonide) .... Three Times A Day As Needed Rash 5)  Hydroxyzine Hcl 25 Mg Tabs (Hydroxyzine Hcl) .Marland Kitchen.. 1 By Mouth Every 6-8 Hours As Needed Itching 6)  Synthroid 125 Mcg Tabs (Levothyroxine Sodium) .... Take One Tablet By Mouth Once Daily. *labs Are Due Now* 7)  Prozac 40 Mg Caps (Fluoxetine Hcl) .Marland Kitchen.. 1 By Mouth Qd  Allergies (verified): 1)  ! Sulfa 2)  Sulfamethoxazole (Sulfamethoxazole)  Past History:  Past Medical History: Last updated: 02/03/2007 Anxiety Breast cancer, hx of Depression Hypothyroidism  Social History: Reviewed history from 04/03/2007 and no changes  required. Occupation: Geophysical data processor Married Never Smoked Alcohol use-no Drug use-no Regular exercise-yes  Review of Systems       denies palpitations and tremor.    Physical Exam  General:  normal appearance.   Neck:  no goiter Psych:  Alert and cooperative; normal mood and affect; normal attention span and concentration.   Additional Exam:  FastTSH        1.07 uIU/mL      Impression & Recommendations:  Problem # 1:  HYPOTHYROIDISM, POST-RADIATION (ICD-244.1) well-repalced  Problem # 2:  DEPRESSION (ICD-311) well-controlled  Problem # 3:  insomnia well-controlled  Other Orders: TLB-TSH (Thyroid Stimulating Hormone) (84443-TSH) Est. Patient Level IV ([address])  Patient Instructions: 1)  blood tests are being ordered for you today.  please call [phone] to hear your test results. 2)  pending the test results, please continue the same medications for now. 3)  Please schedule a follow-up appointment in 1 year.   4)  (update: i left message on phone-tree:  rx as we discussed)   Orders Added: 1)  TLB-TSH (Thyroid Stimulating Hormone) [84443-TSH] 2)  Est. Patient Level IV [81191]

## 2010-07-31 ENCOUNTER — Other Ambulatory Visit: Payer: Self-pay | Admitting: Family Medicine

## 2010-08-02 ENCOUNTER — Other Ambulatory Visit: Payer: Self-pay | Admitting: Family Medicine

## 2010-09-24 ENCOUNTER — Other Ambulatory Visit: Payer: Self-pay | Admitting: Oncology

## 2010-09-24 ENCOUNTER — Encounter (HOSPITAL_BASED_OUTPATIENT_CLINIC_OR_DEPARTMENT_OTHER): Payer: BC Managed Care – PPO | Admitting: Oncology

## 2010-09-24 DIAGNOSIS — Z170 Estrogen receptor positive status [ER+]: Secondary | ICD-10-CM

## 2010-09-24 DIAGNOSIS — D0590 Unspecified type of carcinoma in situ of unspecified breast: Secondary | ICD-10-CM

## 2010-09-24 LAB — COMPREHENSIVE METABOLIC PANEL
Albumin: 4.6 g/dL (ref 3.5–5.2)
BUN: 12 mg/dL (ref 6–23)
CO2: 25 mEq/L (ref 19–32)
Calcium: 9.9 mg/dL (ref 8.4–10.5)
Chloride: 104 mEq/L (ref 96–112)
Glucose, Bld: 96 mg/dL (ref 70–99)
Potassium: 3.9 mEq/L (ref 3.5–5.3)

## 2010-09-24 LAB — CBC WITH DIFFERENTIAL/PLATELET
Basophils Absolute: 0.1 10*3/uL (ref 0.0–0.1)
Eosinophils Absolute: 0.2 10*3/uL (ref 0.0–0.5)
HGB: 12.9 g/dL (ref 11.6–15.9)
MCV: 86.9 fL (ref 79.5–101.0)
MONO#: 0.7 10*3/uL (ref 0.1–0.9)
NEUT#: 7.1 10*3/uL — ABNORMAL HIGH (ref 1.5–6.5)
RDW: 13.4 % (ref 11.2–14.5)
lymph#: 2.7 10*3/uL (ref 0.9–3.3)

## 2010-09-24 NOTE — Op Note (Signed)
Norma Kelley, Norma Kelley                ACCOUNT NO.:  [id number]   MEDICAL RECORD NO.:  [id number]          PATIENT TYPE:  INP   LOCATION:  5740                         FACILITY:  [facility]   PHYSICIAN:  Etter Sjogren, M.D.     DATE OF BIRTH:  01/18/1961   DATE OF PROCEDURE:  07/22/2005  DATE OF DISCHARGE:                                 OPERATIVE REPORT   PREOPERATIVE DIAGNOSIS:  Left breast cancer.   POSTOPERATIVE DIAGNOSIS:  Left breast cancer.   PROCEDURE:  1.  Left ipsilateral transverse rectus abdominis myocutaneous flap breast      reconstruction.  2.  Placement of an On-Q pump.   SURGEON:  Etter Sjogren, M.D.   ASSISTANT:  Milda Smart Cox, R.N.F.A.   ANESTHESIA:  General.   ESTIMATED BLOOD LOSS:  250 cc.   DRAINS:  There were three left, two Blake's for the abdomen and one for the  chest.   INDICATIONS FOR PROCEDURE:  The patient is a 50 year old woman who has had  breast cancer.  She had mastectomy and a delay of her TRAM flap four weeks  ago.  She has done well from that and now presents for the reconstruction.  She does not require any chemotherapy or radiation therapy.  The nature of  the procedure and risks and possible complications were discussed in great  detail with her, and she understood all of these and wished to proceed.  For  further details of history and physical, please see the chart.   DESCRIPTION OF PROCEDURE:  The patient had been marked in a full upright  position for the TRAM flap as well as for the inframammary crease.  She was  taken to the operating room and placed supine.  After successful  administration of general anesthesia, she was prepped with Betadine and  draped with sterile drapes.   The old mastectomy scar was excised as a specimen, and the mastectomy flap  was elevated superior and inferior.  The subcutaneous tunnel was begun at  the medial aspect inferiorly.  Meticulous hemostasis with electrocautery and  a moist lap was placed in this  pocket.   We turned our attention to the abdomen.  The umbilicus was incised, leaving  a generous fatty stalk with it to insure its survival.  It was dissected  free.  The upper limit of the incision was made and the dissection carried  down to the subcutaneous tissue to the underlying rectus fascia using  electrocautery.  Great care was taken throughout the procedure to avoid  damage to the underlying rectus fascia.  The dissection was carried out  superiorly over the rectus fascia, and the subcutaneous tunnel was connected  through and through to the chest.  The lower incision was then made, and the  flap was then elevated from the right side over to the midline, from the  left side over to the lateral row of perforators, taking care to stop at  that point.  A parallel incision was made in the anterior rectus fascia, and  the rectus muscle was dissected free from its surrounding fascial  attachments very carefully using a bipolar as well as a scalpel as needed.  The muscle was mobilized inferiorly.  The intercostal perforators laterally  were double ligated with Ligaclips and divided.  The muscle was completely  mobilized circumferentially.   Attention was then directed back to the chest where there was thorough  irrigation with saline and meticulous hemostasis with electrocautery.  A  Blake drain was positioned and brought through a separate stab wound  inferolaterally and secured with 3-0 Prolene suture.  Back to the abdomen,  the rectus muscle was divided distally with electrocautery.  The flap had  excellent color and excellent capillary refill.  It was passed through the  subcutaneous tunnel without difficulty out onto the left chest.  Zone 4 was  excised.  There was nice bleeding at that level.  The tip of zone 2 was also  excised, and again, bright red bleeding consistent with viability.  The flap  was temporarily secured with skin staples and covered with a dry towel.    Attention was then directed back to the abdomen which was irrigated  thoroughly with saline.  Meticulous hemostasis with electrocautery.  The  fascia closure with 0 Prolene interrupted figure-of-eight sutures, taking  great care to avoid damage to the underlying infra-abdominal contents.  Thorough irrigation again with saline, and onlay mesh was placed over the  abdominal closure and secured around the periphery of it using 2-0 Prolene  suture.  A Blake drain was positioned and brought through a separate stab  wound inferiorly and secured with 3-0 plain suture.  The On-Q pump was  positioned, placing the trocars through superiorly and passing the  catheters.  One of these was placed beneath the fascial closure.  Thorough  irrigation with saline and meticulous hemostasis with electrocautery.  The  remainder of the closure with 0 PDS interrupted inverted deep sutures for  the Scarpa's fascial layer followed by 3-0 Monocryl interrupted inverted  deep dermal sutures and a running 3-0 and 4-0 Monocryl subcuticular suture.  An incision was made at the umbilicus and the umbilicus then brought up  through this opening and secured using 3-0 Monocryl interrupted inverted  deep sutures and a couple of simple sutures.  The abdominal skin had  excellent color, consistent with viability.   Attention was directed to the chest.  Irrigation with saline.  The inferior  border was secured edge to edge with 3-0 Monocryl interrupted inverted deep  sutures.  Marcaine was then placed for deepithelialization, and then that  was performed.  There was bright red bleeding, consistent with viability.  The flap was inset with 3-0 Vicryl interrupted horizontal mattress sutures  for suspension sutures superiorly to the pectoralis muscle, avoiding undue  compression on the flap.  The remainder of the closure with 3-0 Monocryl  interrupted inverted deep sutures and 3-0 Monocryl running subcuticular sutures.  Steri-Strips  and a dry sterile dressing were applied.  She  tolerated the procedure well.  The flap had excellent color at the  conclusion, consistent with viability.      Etter Sjogren, M.D.  Electronically Signed     DB/MEDQ  D:  07/22/2005  T:  07/24/2005  Job:  [id number]

## 2010-09-24 NOTE — Op Note (Signed)
Norma Kelley, Norma Kelley                ACCOUNT NO.:  [id number]   MEDICAL RECORD NO.:  [id number]          PATIENT TYPE:  INP   LOCATION:  2899                         FACILITY:  [facility]   PHYSICIAN:  Sharlet Salina T. Hoxworth, M.D.DATE OF BIRTH:  [birth year]   DATE OF PROCEDURE:  06/21/2005  DATE OF DISCHARGE:                                 OPERATIVE REPORT   PREOPERATIVE DIAGNOSIS:  Ductal carcinoma in situ, left breast.   POSTOPERATIVE DIAGNOSIS:  Ductal carcinoma in situ, left breast.   SURGICAL PROCEDURES:  1.  Blue dye injection left breast.  2.  Left axillary sentinel lymph node biopsy.  3.  Left total mastectomy.   SURGEON:  Lorne Skeens. Hoxworth, M.D.   ANESTHESIA:  General.   BRIEF HISTORY:  [name] is a 50 year old female who was found to have two  separate areas of calcifications widely separated in the left breast on  recent screening mammogram.  Large core needle biopsy of each area has shown  ductal carcinoma in situ.  After further workup and discussion in the  office, we have elected proceed with left axillary sentinel lymph node  biopsy and left total mastectomy with delayed TRAM flap reconstruction by  Dr. Odis Luster.  The nature of the procedure, indications, risks of bleeding,  infection, possible need for immediate or delayed axillary dissection, were  discussed and understood.  She is now brought to the operating room for this  procedure.   DESCRIPTION OF PROCEDURE:  One hour prior to procedure, 1 millicurie of  technetium sulfur colloid was injected in the nipple intradermally.  The  patient brought to operating room and laryngeal mask general anesthesia was  induced.  The entire chest and abdomen were widely sterilely prepped and  draped.  She received preoperative IV antibiotics.  Correct patient and  procedure were verified.  10 mL of methylene blue was then injected  subareolary and massaged for five minutes.  The NeoProbe was used to locate  a definite hot  area in the left axilla which appeared to be accessible  through the routine mastectomy incision.  An elliptical transverse incision  was used excising the para-nipple areola complex.  Initially, the flaps were  developed superiorly and laterally over the axilla.  A hot area in the  axilla was again localized and using careful blunt dissection down through  the clavipectoral fascia, a blue lymphatic was traced down to a blue node  that had high counts.  This was excised with cautery and the pedicle  clipped.  Ex vivo, the node had counts of approximately 1500 with background  counts in the axilla of less than 25.  This was sent as a hot blue axillary  lymph node.  Subsequently, the touch prep report returned as negative for  tumor cells.  Following that, further skin and subcutaneous flaps were  raised superiorly toward the clavicle, medially toward the edge the sternum,  inferiorly toward the origin of the rectus muscle, laterally out toward the  anterior edge of the latissimus dorsi.  The breast was then reflected up off  of the pectoralis fascia  with cautery, working medial to laterally.  It was  freed from the edge of the pectoralis major and from its attachment to the  serratus.  Posterior attachments were then divided until the breast was  completely mobilized up to a small axillary attachment which was divided  between [place] clamps at the level of the pectoralis edge.  This was tied  with 3-0 Vicryl.  The specimen was weighed and sent for permanent pathology.  The wound was copiously irrigated with  saline and complete hemostasis assured.  A round Blake closed suction drain  was brought out through separate stab wound left in the axilla under the  flaps. The skin was closed with staples.  Sponge, needle, and instrument  counts were correct.  Dr. Odis Luster then proceeded with his portion of the  procedure.      Lorne Skeens. Hoxworth, M.D.  Electronically Signed     BTH/MEDQ  D:   06/21/2005  T:  06/21/2005  Job:  [id number]

## 2010-09-24 NOTE — Discharge Summary (Signed)
NAMEENYA, BUREAU                ACCOUNT NO.:  [id number]   MEDICAL RECORD NO.:  [id number]          PATIENT TYPE:  INP   LOCATION:  5740                         FACILITY:  [facility]   PHYSICIAN:  Etter Sjogren, M.D.     DATE OF BIRTH:  [birth year]   DATE OF ADMISSION:  07/22/2005  DATE OF DISCHARGE:  07/24/2005                                 DISCHARGE SUMMARY   FINAL DIAGNOSIS:  Breast cancer.   PROCEDURE PERFORMED:  Left breast reconstruction with a transverse rectus  abdominis myocutaneous flap reconstruction.   CONDITION ON DISCHARGE:  Excellent.   SUMMARY OF HISTORY AND PHYSICAL:  A 50 year old woman who has had left  breast cancer.  She has had a mastectomy and a delay of her TRAM flap.  She  now presents for the reconstruction.  The nature of the procedure and the  risks were discussed with her, and she understood those risks and wished to  proceed.  For further details of the history and physical, please see the  chart.   HOSPITAL COURSE:  On admission she was taken to surgery, at which time the  TRAM flap was performed.  She tolerated the procedure very well.  Postoperatively she remained afebrile.  The flap had excellent color and  excellent capillary refill at one to two seconds.  The abdomen was soft, no  evidence of any hematoma.  The drains functioned.  The On-Q pain pump did  not function.  By the second postoperative day after flushing it once, it  was felt that we could just remove it.  She was tolerating a regular diet  and she was ambulating, voiding well and ready to be discharged.   DISPOSITION:  No lifting, no vigorous activities, no raising the left arm  over her head.  Empty the drains three times a day and record.  Use the  spirometer at home five times a day.  Keflex 500 mg p.o. q.i.d. and Darvocet-  N 100, a total of 30 given, one p.o. q.4h. or two p.o. q.6h. p.r.n. pain.  I  will see her back in the office in four days for recheck.      Etter Sjogren,  M.D.  Electronically Signed     DB/MEDQ  D:  07/24/2005  T:  07/25/2005  Job:  [id number]

## 2010-09-24 NOTE — Discharge Summary (Signed)
Norma Kelley, ISLAM                ACCOUNT NO.:  [id number]   MEDICAL RECORD NO.:  [id number]          PATIENT TYPE:  INP   LOCATION:  5731                         FACILITY:  [facility]   PHYSICIAN:  Etter Sjogren, M.D.     DATE OF BIRTH:  [birth year]   DATE OF ADMISSION:  06/21/2005  DATE OF DISCHARGE:  06/22/2005                                 DISCHARGE SUMMARY   DISCHARGE DIAGNOSES:  Left breast cancer.   OPERATION/PROCEDURE:  1.  Left mastectomy with sentinel lymph node biopsy.  2.  Complex wound closure of the abdomen.   HISTORY OF PRESENT ILLNESS:  The patient is a 50 year old woman with left  breast cancer scheduled for mastectomy and desired reconstruction.  Options  were discussed and she elected to have tissue utilized for preparatory  stage.  She presents today for progression of the abdominal flap at the time  of this admission at the same time as her mastectomy.  For further details  of history and physical, please see chart.   BRIEF COURSE IN HOSPITAL:  She was taken to surgery at which time mastectomy  and sentinel lymph node biopsy were performed.  The abdominal flap was also  prepared at that time.  She tolerated both procedures well.  Postoperatively  she was afebrile.  Drain was functioning. Surgical sites appeared fine.  It  was felt that she was ready to be discharged.   DISPOSITION:  She was discharged on a regular diet.  No lifting for six  weeks.  She will empty the drain three times a day and record the amount.   DISCHARGE MEDICATIONS:  Percocet, 30 given, 5 mg tablets one p.o. q.4h.  p.r.n. pain.   FOLLOW UP:  Will recheck in the office on Monday of this next week, February  19.  Dr. Johna Sheriff will recheck her next week as well.      Etter Sjogren, M.D.  Electronically Signed     DB/MEDQ  D:  06/22/2005  T:  06/22/2005  Job:  [id number]

## 2010-09-24 NOTE — Op Note (Signed)
Norma Kelley, Norma Kelley                ACCOUNT NO.:  [id number]   MEDICAL RECORD NO.:  [id number]          PATIENT TYPE:  INP   LOCATION:  2899                         FACILITY:  [facility]   PHYSICIAN:  Etter Sjogren, M.D.     DATE OF BIRTH:  28-Apr-1961   DATE OF PROCEDURE:  06/21/2005  DATE OF DISCHARGE:                                 OPERATIVE REPORT   PREOPERATIVE DIAGNOSES:  1.  Left breast cancer.   POSTOPERATIVE DIAGNOSES:  1.  Left breast cancer.  2.  Complicated open wound of the abdomen 40 centimeters from the colon      complex.   OPERATION PERFORMED:  Wound closure of the abdomen 40 centimeters secondary  to delay of the TRAM flap.   SURGEON:  Etter Sjogren, M.D.   ANESTHESIA:  General.   ESTIMATED BLOOD LOSS:  The estimated blood loss was minimal.   DRAINS:  One Blake for the abdomen.   CLINICAL NOTE:  This is a 50 year old woman who had breast cancer.  She  desired breast reconstruction and options were discussed with her.  She  preferred to use her own tissue.  It was felt that she needed to have that  flap delayed in order to make it healthier in order to try to achieve a size  to match with her opposite side.  After the procedure, risks and the  possible complications were discussed with her in detail, and she understood  those and the reasoning behind staging these procedures, she wished to  proceed.   DESCRIPTION OF THE OPERATION:  The mastectomy had been completed.  The  incision was made as marked for the abdomen and dissection was carried down  to the subcutaneous tissue to Scarpa's, down to the underlying fascia using  electrocautery.  The underlying fascia was identified.  An incision was made  through the rectus fascia at the lateral border of the rectus muscles  bilaterally.  The rectus muscles were retracted gently in a medial direction  and careful dissection exposed the deep inferior epigastric vital signs.  These vessels were mobilized and the artery  was triply ligated proximally  and distally, and divided; and, the vein was doubly ligated proximally and  distally, and divided.  A check was made for hemostasis, which was noted to  be excellent.  The wound was irrigated with saline and the fascia was closed  with 0-Prolene interrupted figure-of-eight sutures.  The remainder of the  limb was irrigated thoroughly and excellent hemostasis had been achieved.  A  layered closure was performed after placing a Blake drain and bringing it  out through the lateral aspect of the wound.  This layered closure was with  0-PDS for the  deep layer, 3-0 Monocryl interrupted deep dermal sutures and running 3-0  Monocryl subcuticular suture.  Steri-strips, sterile dressing and an  abdominal binder were applied.   The patient was transferred to the recovery room in stable condition after  having tolerated the procedure well.      Etter Sjogren, M.D.  Electronically Signed     DB/MEDQ  D:  06/21/2005  T:  06/22/2005  Job:  [id number]

## 2010-09-24 NOTE — Consult Note (Signed)
[facility]                          ENDOCRINOLOGY CONSULTATION   Norma Kelley, Norma Kelley                       MRN:          [id number]  DATE:08/31/2006                            DOB:          1960-10-01    REFERRING PHYSICIAN:  Leanne Chang, M.D.   REASON FOR REFERRAL:  Hyperthyroidism.   HISTORY OF PRESENT ILLNESS:  The patient is a 50 year old woman who  recently had abnormal thyroid function studies on a routine blood test.  She states that in retrospect, she has had several months of anxiety,  moderate weight gain of 20 pounds, with some associated minimal tremor  of her hands, DOE, excessive diaphoresis, and palpitations.   PAST MEDICAL HISTORY:  Breast cancer.   MEDICATIONS:  Xanax p.r.n.   SOCIAL HISTORY:  She is married. She works as an Production designer, theatre/television/film at [employer].   FAMILY HISTORY:  Negative for thyroid disease.   REVIEW OF SYSTEMS:  Denies fever, syncope, and itching.   PHYSICAL EXAMINATION:  VITAL SIGNS:  Blood pressure 116/70, heart rate  75, temperature 99.1, weight 179.  GENERAL:  No distress.  SKIN:  Not diaphoretic, no rash.  HEENT:  No proptosis, no periorbital swelling.  NECK:  Thyroid is normal to my examination.  CHEST:  Clear to auscultation. No respiratory distress.  CARDIOVASCULAR:  No JVD. Regular rate and rhythm. No murmur.  NEUROLOGIC:  Alert and well oriented. There is no tremor. Muscle bulk  and strength appear to be normal.   LABORATORY DATA:  Forwarded by Dr. Blossom Hoops on July 24, 2006, TSH  0.12. On August 03, 2006, TSH was 0.04. Free T4 and free T3 are normal.   IMPRESSION:  1. Hyperthyroidism of uncertain etiology.  2. Weight gain. This is seen in about 5% of hyperthyroid patients.  3. Other symptoms complex as noted above, which probably is thyroid      related.   PLAN:  We discussed the etiologies, natural history, and risks of  hyperthyroidism. She tentatively chooses Iodine-131 therapy. I have set  her up for a nuclear medicine scan with the  tentative plan that she would have Iodine-131 therapy thereafter and she  will return in about 2 months later.     Sean A. Everardo All, MD  Electronically Signed    SAE/MedQ  DD: 09/03/2006  DT: 09/03/2006  Job #: [id number]   cc:   Leanne Chang, M.D.

## 2010-09-25 ENCOUNTER — Other Ambulatory Visit: Payer: Self-pay | Admitting: Family Medicine

## 2010-09-27 ENCOUNTER — Encounter: Payer: Self-pay | Admitting: *Deleted

## 2010-09-27 NOTE — Telephone Encounter (Signed)
Letter Mail advising office visit due now

## 2010-10-01 ENCOUNTER — Other Ambulatory Visit: Payer: Self-pay | Admitting: Family Medicine

## 2010-10-05 ENCOUNTER — Other Ambulatory Visit: Payer: Self-pay | Admitting: Family Medicine

## 2010-10-05 NOTE — Telephone Encounter (Signed)
Rx called in to pharmacy. 

## 2010-10-05 NOTE — Telephone Encounter (Signed)
Dr.Eliison manages Thyroid meds--Will forward     KP

## 2010-10-06 NOTE — Telephone Encounter (Signed)
Rx was filled by Dr.Ellison on 10/01/10----- KP

## 2010-10-11 ENCOUNTER — Encounter: Payer: Self-pay | Admitting: Family Medicine

## 2010-10-13 ENCOUNTER — Ambulatory Visit (INDEPENDENT_AMBULATORY_CARE_PROVIDER_SITE_OTHER): Payer: BC Managed Care – PPO | Admitting: Family Medicine

## 2010-10-13 ENCOUNTER — Encounter: Payer: Self-pay | Admitting: Family Medicine

## 2010-10-13 VITALS — BP 118/82 | HR 72 | Temp 98.9°F | Wt 205.0 lb

## 2010-10-13 DIAGNOSIS — M549 Dorsalgia, unspecified: Secondary | ICD-10-CM | POA: Insufficient documentation

## 2010-10-13 DIAGNOSIS — F419 Anxiety disorder, unspecified: Secondary | ICD-10-CM

## 2010-10-13 DIAGNOSIS — F411 Generalized anxiety disorder: Secondary | ICD-10-CM

## 2010-10-13 MED ORDER — FLUOXETINE HCL 40 MG PO CAPS: 40.0000 mg | ORAL_CAPSULE | Freq: Every day | ORAL | Status: DC

## 2010-10-13 MED ORDER — ALPRAZOLAM 0.25 MG PO TABS: 0.2500 mg | ORAL_TABLET | Freq: Three times a day (TID) | ORAL | Status: DC | PRN

## 2010-10-13 MED ORDER — MELOXICAM 15 MG PO TABS: 15.0000 mg | ORAL_TABLET | Freq: Every day | ORAL | Status: DC

## 2010-10-13 NOTE — Assessment & Plan Note (Signed)
Refill meds rto 6 months or sooner prn 

## 2010-10-13 NOTE — Progress Notes (Signed)
  Subjective:    Patient ID: Norma Kelley, female    DOB: 01/05/1961, 50 y.o.   MRN: [id number]  HPI Pt here f/u anxiety.  She will see Dr Danielle Dess for her back next month.  Ortho wanted to do surgery.   Review of Systems    as above Objective:   Physical Exam  Constitutional: She is oriented to person, place, and time. She appears well-developed and well-nourished.  Neurological: She is alert and oriented to person, place, and time.  Psychiatric: She has a normal mood and affect. Her behavior is normal. Judgment and thought content normal.          Assessment & Plan:

## 2010-10-13 NOTE — Assessment & Plan Note (Signed)
Pt has appointment with neurosurgery con't muscle relaxer and ultram Can try mobic daily

## 2010-10-13 NOTE — Patient Instructions (Signed)

## 2010-10-21 ENCOUNTER — Encounter (HOSPITAL_BASED_OUTPATIENT_CLINIC_OR_DEPARTMENT_OTHER): Payer: BC Managed Care – PPO | Admitting: Oncology

## 2010-10-21 ENCOUNTER — Other Ambulatory Visit: Payer: Self-pay | Admitting: Oncology

## 2010-10-21 DIAGNOSIS — Z853 Personal history of malignant neoplasm of breast: Secondary | ICD-10-CM

## 2010-10-21 DIAGNOSIS — Z1231 Encounter for screening mammogram for malignant neoplasm of breast: Secondary | ICD-10-CM

## 2010-11-09 ENCOUNTER — Other Ambulatory Visit: Payer: Self-pay | Admitting: Endocrinology

## 2010-11-09 NOTE — Telephone Encounter (Signed)
Please advise-  Last OV-07/08/2010

## 2010-11-09 NOTE — Telephone Encounter (Signed)
Rx faxed to [facility]. 

## 2010-11-16 ENCOUNTER — Telehealth: Payer: Self-pay | Admitting: *Deleted

## 2010-11-16 MED ORDER — TRAMADOL HCL 50 MG PO TABS: 50.0000 mg | ORAL_TABLET | Freq: Four times a day (QID) | ORAL | Status: DC | PRN

## 2010-11-16 NOTE — Telephone Encounter (Signed)
Can try ultram 50 mg  1 po q6h prn  #60  No refills

## 2010-11-16 NOTE — Telephone Encounter (Addendum)
Ultram is already on pt med list. I called pt and she notes that she was on this previously and would like it refilled. She states that our office did not refill the med. Pt is aware we will refill the med today.

## 2010-11-16 NOTE — Telephone Encounter (Signed)
Pt lm on vm that she would like a Meloxicam replacement. She notes that it did not help at all and she does not want to take a med that she cannot tell is helping. Pt notes that due to pinched nerve she has leg and foot pain and well as toe numbness. She is requesting mild pain med that will not make her sick. She states that she could take Darvocet, but is aware that it has been taken off the market. Pt notes that Percocet causes nausea. Her appt with Dr. Danielle Dess is 12/15/10. Please advise.

## 2010-12-04 ENCOUNTER — Other Ambulatory Visit: Payer: Self-pay | Admitting: Family Medicine

## 2010-12-06 NOTE — Telephone Encounter (Signed)
Last Ov-10/13/10, last filled 11/16/10 #60, no rfs.

## 2010-12-06 NOTE — Telephone Encounter (Signed)
Ok for #60, no refills 

## 2010-12-08 ENCOUNTER — Other Ambulatory Visit: Payer: Self-pay | Admitting: Endocrinology

## 2010-12-08 NOTE — Telephone Encounter (Signed)
Rx faxed to [facility]. 

## 2010-12-08 NOTE — Telephone Encounter (Signed)
Please advise 

## 2010-12-27 ENCOUNTER — Other Ambulatory Visit: Payer: Self-pay | Admitting: Family Medicine

## 2010-12-28 MED ORDER — TRAMADOL HCL 50 MG PO TABS: ORAL_TABLET | ORAL | Status: DC

## 2010-12-28 NOTE — Telephone Encounter (Signed)
Vm left advising Rx sent to the pharmacy     KP

## 2010-12-28 NOTE — Telephone Encounter (Signed)
Last seen 10/13/10 and filled 12/04/10 #60 please advise     KP

## 2011-01-06 ENCOUNTER — Telehealth: Payer: Self-pay

## 2011-01-06 ENCOUNTER — Other Ambulatory Visit: Payer: Self-pay | Admitting: Endocrinology

## 2011-01-06 NOTE — Telephone Encounter (Signed)
mssg left for a return call    KP  

## 2011-01-06 NOTE — Telephone Encounter (Signed)
Message copied by Arnette Norris on Thu Jan 06, 2011  4:27 PM ------      Message from: Lelon Perla      Created: Thu Jan 06, 2011  4:06 PM       Pt sent request to Dr Everardo All for xanax  But it looks like she got 90 pills on 8/1---- she is using it too often and needs ov to discuss

## 2011-01-06 NOTE — Telephone Encounter (Signed)
Please advise-last written 12/08/2010 #90 with 0 refills  Last OV-07/08/2010

## 2011-01-06 NOTE — Telephone Encounter (Signed)
Scheduled an appt  to discuss meds    KP

## 2011-01-07 ENCOUNTER — Other Ambulatory Visit: Payer: Self-pay

## 2011-01-07 ENCOUNTER — Encounter (HOSPITAL_COMMUNITY)
Admission: RE | Admit: 2011-01-07 | Discharge: 2011-01-07 | Disposition: A | Discharge status: Home / Self Care | Payer: BC Managed Care – PPO | Source: Ambulatory Visit | Attending: Neurological Surgery | Admitting: Neurological Surgery

## 2011-01-07 ENCOUNTER — Ambulatory Visit (INDEPENDENT_AMBULATORY_CARE_PROVIDER_SITE_OTHER): Payer: BC Managed Care – PPO | Admitting: Family Medicine

## 2011-01-07 ENCOUNTER — Encounter: Payer: Self-pay | Admitting: Family Medicine

## 2011-01-07 DIAGNOSIS — F411 Generalized anxiety disorder: Secondary | ICD-10-CM

## 2011-01-07 DIAGNOSIS — E039 Hypothyroidism, unspecified: Secondary | ICD-10-CM

## 2011-01-07 DIAGNOSIS — F419 Anxiety disorder, unspecified: Secondary | ICD-10-CM

## 2011-01-07 LAB — CBC
HCT: 36.9 % (ref 36.0–46.0)
MCHC: 34.4 g/dL (ref 30.0–36.0)
MCV: 85.4 fL (ref 78.0–100.0)
RDW: 12.8 % (ref 11.5–15.5)

## 2011-01-07 LAB — BASIC METABOLIC PANEL
BUN: 12 mg/dL (ref 6–23)
Creatinine, Ser: 0.81 mg/dL (ref 0.50–1.10)
GFR calc Af Amer: >60 mL/min (ref >60)
GFR calc non Af Amer: >60 mL/min (ref >60)
Potassium: 4.1 mEq/L (ref 3.5–5.1)

## 2011-01-07 MED ORDER — FLUOXETINE HCL 20 MG PO CAPS: 20.0000 mg | ORAL_CAPSULE | Freq: Every day | ORAL | Status: DC

## 2011-01-07 MED ORDER — ALPRAZOLAM 0.25 MG PO TABS: ORAL_TABLET | ORAL | Status: DC

## 2011-01-07 MED ORDER — LEVOTHYROXINE SODIUM 125 MCG PO TABS: ORAL_TABLET | ORAL | Status: DC

## 2011-01-07 NOTE — Telephone Encounter (Signed)
i discussed with dr Sabino Donovan said she would take care of this).

## 2011-01-07 NOTE — Progress Notes (Signed)
  Subjective:    Patient ID: Norma Kelley, female    DOB: 01/18/1961, 50 y.o.   MRN: [id number]  HPI  Pt here f/u anxiety.  Pt is having back surgery next month.  It seemed she needed more xanax when we increased the prozac.    Review of Systems    as above Objective:   Physical Exam  Constitutional: She is oriented to person, place, and time. She appears well-developed and well-nourished.  Neurological: She is alert and oriented to person, place, and time.  Psychiatric: She has a normal mood and affect. Her behavior is normal. Judgment and thought content normal.          Assessment & Plan:

## 2011-01-07 NOTE — Assessment & Plan Note (Signed)
Decrease prozac to 20 mg  Xanax refilled  F/u after back surgery

## 2011-01-07 NOTE — Telephone Encounter (Signed)
(  dr Laury Axon said she would address this0

## 2011-01-20 ENCOUNTER — Other Ambulatory Visit: Payer: Self-pay | Admitting: Neurological Surgery

## 2011-01-20 ENCOUNTER — Inpatient Hospital Stay (HOSPITAL_COMMUNITY): Payer: BC Managed Care – PPO

## 2011-01-20 ENCOUNTER — Inpatient Hospital Stay (HOSPITAL_COMMUNITY)
Admission: RE | Admit: 2011-01-20 | Discharge: 2011-01-22 | DRG: 756 | Disposition: A | Discharge status: Home / Self Care | Payer: BC Managed Care – PPO | Source: Ambulatory Visit | Attending: Neurological Surgery | Admitting: Neurological Surgery

## 2011-01-20 DIAGNOSIS — M48062 Spinal stenosis, lumbar region with neurogenic claudication: Secondary | ICD-10-CM | POA: Diagnosis present

## 2011-01-20 DIAGNOSIS — M4310 Spondylolisthesis, site unspecified: Principal | ICD-10-CM | POA: Diagnosis present

## 2011-01-20 DIAGNOSIS — Z01812 Encounter for preprocedural laboratory examination: Secondary | ICD-10-CM

## 2011-01-20 DIAGNOSIS — Z01810 Encounter for preprocedural cardiovascular examination: Secondary | ICD-10-CM

## 2011-01-21 LAB — ABO/RH: ABO/RH(D): A POS

## 2011-02-09 ENCOUNTER — Encounter: Payer: Self-pay | Admitting: Family Medicine

## 2011-02-09 ENCOUNTER — Ambulatory Visit (INDEPENDENT_AMBULATORY_CARE_PROVIDER_SITE_OTHER): Payer: BC Managed Care – PPO | Admitting: Family Medicine

## 2011-02-09 VITALS — BP 118/82 | HR 76 | Temp 99.1°F | Wt 203.8 lb

## 2011-02-09 DIAGNOSIS — F411 Generalized anxiety disorder: Secondary | ICD-10-CM

## 2011-02-09 DIAGNOSIS — F419 Anxiety disorder, unspecified: Secondary | ICD-10-CM

## 2011-02-09 DIAGNOSIS — L259 Unspecified contact dermatitis, unspecified cause: Secondary | ICD-10-CM

## 2011-02-09 MED ORDER — MOMETASONE FUROATE 0.1 % EX CREA: TOPICAL_CREAM | CUTANEOUS | Status: DC

## 2011-02-09 MED ORDER — CLONAZEPAM 0.5 MG PO TABS: 0.5000 mg | ORAL_TABLET | Freq: Every evening | ORAL | Status: DC | PRN

## 2011-02-09 NOTE — Progress Notes (Signed)
  Subjective:    Patient ID: Norma Kelley, female    DOB: [birth year], 50 y.o.   MRN: [id number]  HPI Pt here to f/u from her back surgery.  Pt is doing well and would like to come off prozac .  She is also complaining about rash on her abd that is very itchy---its been since surgery and is on her abd where her brace is.      Review of Systems As above    Objective:   Physical Exam  Constitutional: She is oriented to person, place, and time. She appears well-developed and well-nourished.  Neurological: She is alert and oriented to person, place, and time.  Skin:       + fine errythematous rash on abd and back in area where brace is.  Psychiatric: She has a normal mood and affect. Her behavior is normal. Thought content normal.          Assessment & Plan:  Anxiety/ insomnia--- improved,  Stop prozac,  Ok to use klonodin for sleep for next few weeks Contact derm--- elocon qd

## 2011-02-09 NOTE — Patient Instructions (Signed)
Allergic Contact Dermatitis   Allergic contact dermatitis is an itchy, red and scaly rash. It is caused by an allergic reaction to certain substances that touch your skin. The rash often times appears within one to three days after contact.   CAUSES   Poison oak.   Poison ivy.   Chemicals (deodorants, shampoos).   Jewelry.   Latex.   Neomycin in triple antibiotic cream.   SYMPTOMS   In poison ivy or oak, a very itchy rash with tiny blisters may appear. This comes 1-2 days after exposure.   The skin may also appear to be dry with flaking.   There can be marked swelling in some areas such as the eyelids, mouth or genitals.   TREATMENT   It is most important to avoid all contact with the allergic substances. Additional treatment of allergic dermatitis may include:   Burrow's solution soaks to help dry the oozing sores.   Cortisone creams or ointments applied 3-4 times daily. For best results, soak rash area in cool water for 20 minutes. Then apply the medicine. Cover the area with a plastic wrap. You can store the cortisone cream in the refrigerator for a "chilly" effect on your rash. That may decrease itching.   Injections or oral cortisone medicines are needed in more severe cases.   Antihistamine medicine helps ease the itching.   A cool bath can also help stop itching. Try not to scratch the rash.   Medicines that kill germs (antibiotics) may be needed if there is a skin infection present.   DIAGNOSIS   In cases where the cause is uncertain, patch skin tests may be performed to help confirm the identity of the source.   HOME CARE INSTRUCTIONS   Use medications as directed.   Keep the affected area away from anything that would irritate your skin.   If you have poison oak or ivy, the clothing you were wearing at the time of contact should be washed or cleaned. This will get rid of the substance (resin) that caused the allergic reaction.   If you have a pet, the resins may also be present in their fur. They also need  washing with soap and water.   You may also prevent poison oak or ivy by washing exposed skin with soap and water as soon as possible after contact.   As with poison ivy, avoid contact with these plants. Wear protective clothing and gloves when you must be in contact. A barrier cream such as Ivy Block or Shield before exposure will help an outbreak if you bathe within 8 hours of contact.   SEEK MEDICAL CARE IF:   Your condition is not better after 3 days of treatment or you seem to keep getting worse.   You see signs of infection, such as swelling, tenderness, redness or soreness (inflammation), or warmth of affected area.   You have any problems related to medication used.   Document Released: 06/02/2004 Document Re-Released: 07/22/2008   Exit[employer]® Patient Information ©2011 [employer].

## 2011-03-04 ENCOUNTER — Telehealth: Payer: Self-pay | Admitting: *Deleted

## 2011-03-04 MED ORDER — ESCITALOPRAM OXALATE 10 MG PO TABS: 10.0000 mg | ORAL_TABLET | Freq: Every day | ORAL | Status: DC

## 2011-03-04 NOTE — Telephone Encounter (Signed)
Dicussed with patient and she agreed to follow up in 3-4 weeks. Rx faxed       KP

## 2011-03-04 NOTE — Telephone Encounter (Signed)
Pt recently stopped prozac and xanax. She wants to resume an antidepressant but would like something different than prozac.

## 2011-03-04 NOTE — Telephone Encounter (Signed)
lexapro 10 mg 1 po qd  #30  ---ov 3-4 weeks

## 2011-03-09 NOTE — Op Note (Signed)
[name], [name]                ACCOUNT NO.:  [id number]  MEDICAL RECORD NO.:  [id number]  LOCATION:  3004                         FACILITY:  [facility]  PHYSICIAN:  Stefani Dama, M.D.  DATE OF BIRTH:  03/27/1961  DATE OF PROCEDURE:  01/20/2011 DATE OF DISCHARGE:                              OPERATIVE REPORT   PREOPERATIVE DIAGNOSES:  Lumbar spondylolisthesis L4-L5 with lumbar spinal stenosis, neurogenic claudication, and lumbar radiculopathy.  POSTOPERATIVE DIAGNOSES:  Lumbar spondylolisthesis L4-L5 with lumbar spinal stenosis, neurogenic claudication, and lumbar radiculopathy.  PROCEDURES:  Bilateral laminotomies L4-L5, decompression of L4-L5 nerve roots, posterior lumbar interbody arthrodesis with PEEK spacers, local autograft, and allograft, pedicle screw fixation L4-L5 with posterolateral arthrodesis using local autograft and allograft.  SURGEON:  Stefani Dama, MD  FIRST ASSISTANT:  Cristi Loron, MD  ANESTHESIA:  General endotracheal.  INDICATIONS:  Norma Kelley is a 50 year old individual who has had significant back pain, bilateral leg pain, and she has had evidence of spondylolisthesis.  She has been evaluated and advised regarding surgical intervention as she has not had any significant relief with conservative measures.  She is now taken to the operating room to undergo bilateral decompression arthrodesis.  PROCEDURE:  The patient was brought to the operating room supine on the stretcher.  After smooth induction of general endotracheal anesthesia, she was turned prone.  The back was prepped with alcohol and DuraPrep and draped in sterile fashion.  Midline incision was created and carried down to lumbodorsal fascia, which was opened on either side of midline to expose the spinous processes of L4 and L5.  Interlaminar space was then cleared and the dissection was carried down over the facet joints. Second radiograph was obtained to verify that the L4-5  space was positively identified and then large laminotomies were created including complete facetectomies to expose the superior articular process of L5 and to clear the region of the facet and the common dural tube and to identify and decompress the L4 nerve root in its travel out the foramen and L5 nerve root in its travel towards the foramen under L5.  Once this was isolated then it was noted that the disk was severely bulging and there was a spondylolisthesis.  The disk space was then entered first on the right side and then on the left side and a complete diskectomy was performed removing all degenerated disk material from within the disk space.  This allowed the disk space to become mobile and it was then distracted, a total height of 12 mm, and this also provided some substantial reduction of the spondylolisthesis at this level.  Once the sizers were placed, it was then chosen to decorticate the endplates fully, place 12-mm spacers that were filled with a combination of PureGen mixed on bony matrix, which included a nano part of cortical product, this was then packed into the interspace first on the right then on the left and a second syringe full of material was packed into the interspace to fill the interstices around the spacers.  Then attention was turned to the intertransverse spaces, which were decorticated at L4-L5 and the area was then prepared for pedicle screw placement.  Fluoroscopic guidance was used to place four pedicle screws. Each of them measuring 6.5 x 45 mm screws, each being placed individually after sounding the pedicle, probing it, tapping it individually, then checking it for no cutout and placing the screws under fluoroscopic guidance.  35-mm precontoured rods were then used to connect the screw heads together.  These were bound in the neutral construct.  The lateral gutters were then packed with the autograft and allograft.  During this procedure, Dr.  Lovell Sheehan helped during the preparation of the intertransverse places by providing retraction and also doing some decortication, also provided retraction of the exposure and worked laterally well, worked medially doing a good portion of the diskectomy to decompress the spine as they are doing critical portions of the surgery, particularly decompression of L4-L5 nerve roots.  At this point, then we packed the lateral gutters and once this was secured then the retractors were removed.  Lumbodorsal fascia was closed with #1 Vicryl interrupted fashion, 2-0 Vicryl using subcutaneous tissues, 20 mL of 0.5% Marcaine was injected into the paraspinous musculature and the fascia and then the subcuticular layer was closed with 3-0 Vicryl in inverted interrupted fashion.  Dermabond was placed on the skin.  The patient lost total of about 400 mL of blood, 100 mL of Cell Saver blood was returned to the patient.     Stefani Dama, M.D.     Merla Riches  D:  01/20/2011  T:  01/21/2011  Job:  [id number]  Electronically Signed by Barnett Abu M.D. on 03/09/2011 06:53:20 AM

## 2011-03-31 ENCOUNTER — Other Ambulatory Visit: Payer: Self-pay | Admitting: Family Medicine

## 2011-04-01 NOTE — Telephone Encounter (Signed)
Patient needs to schedule a follow-up with Dr.Lowne

## 2011-04-10 ENCOUNTER — Other Ambulatory Visit: Payer: Self-pay | Admitting: Family Medicine

## 2011-05-10 HISTORY — PX: BACK SURGERY: SHX140

## 2011-05-16 ENCOUNTER — Ambulatory Visit
Admission: RE | Admit: 2011-05-16 | Discharge: 2011-05-16 | Disposition: A | Discharge status: Home / Self Care | Payer: BC Managed Care – PPO | Source: Ambulatory Visit | Attending: Oncology | Admitting: Oncology

## 2011-05-16 DIAGNOSIS — Z1231 Encounter for screening mammogram for malignant neoplasm of breast: Secondary | ICD-10-CM

## 2011-06-06 ENCOUNTER — Telehealth: Payer: Self-pay | Admitting: *Deleted

## 2011-06-06 MED ORDER — ZOLPIDEM TARTRATE 5 MG PO TABS: 5.0000 mg | ORAL_TABLET | Freq: Every evening | ORAL | Status: DC | PRN

## 2011-06-06 NOTE — Telephone Encounter (Signed)
Patient aware and Rx has been faxed       KP 

## 2011-06-06 NOTE — Telephone Encounter (Signed)
Pt left VM that her father passed away on October 21, 2022 and her mother is still currently in the hosp. Pt c/o increased anxiety and trouble sleeping. Pt is request a med to help her sleep. Marland KitchenPlease advise

## 2011-06-06 NOTE — Telephone Encounter (Signed)
ambien 5 mg #15  1 po qhs prn ----

## 2011-06-09 ENCOUNTER — Encounter: Payer: Self-pay | Admitting: Family Medicine

## 2011-06-09 ENCOUNTER — Ambulatory Visit (INDEPENDENT_AMBULATORY_CARE_PROVIDER_SITE_OTHER): Payer: BC Managed Care – PPO | Admitting: Family Medicine

## 2011-06-09 VITALS — BP 130/80 | HR 80 | Temp 99.4°F | Wt 207.0 lb

## 2011-06-09 DIAGNOSIS — F438 Other reactions to severe stress: Secondary | ICD-10-CM

## 2011-06-09 DIAGNOSIS — F419 Anxiety disorder, unspecified: Secondary | ICD-10-CM

## 2011-06-09 DIAGNOSIS — F430 Acute stress reaction: Secondary | ICD-10-CM

## 2011-06-09 DIAGNOSIS — F410 Panic disorder [episodic paroxysmal anxiety] without agoraphobia: Secondary | ICD-10-CM

## 2011-06-09 DIAGNOSIS — F411 Generalized anxiety disorder: Secondary | ICD-10-CM

## 2011-06-09 DIAGNOSIS — F4389 Other reactions to severe stress: Secondary | ICD-10-CM

## 2011-06-09 DIAGNOSIS — R002 Palpitations: Secondary | ICD-10-CM

## 2011-06-09 MED ORDER — FLUOXETINE HCL 20 MG PO TABS: 20.0000 mg | ORAL_TABLET | Freq: Every day | ORAL | Status: DC

## 2011-06-09 MED ORDER — ALPRAZOLAM 0.25 MG PO TABS: 0.2500 mg | ORAL_TABLET | Freq: Three times a day (TID) | ORAL | Status: AC | PRN

## 2011-06-09 NOTE — Progress Notes (Deleted)
  Subjective:    Patient ID: Norma Kelley, female    DOB: 1961/02/22, 51 y.o.   MRN: [id number]  HPI    Review of Systems     Objective:   Physical Exam        Assessment & Plan:

## 2011-06-09 NOTE — Patient Instructions (Signed)

## 2011-06-09 NOTE — Progress Notes (Signed)
  Subjective:     Norma Kelley is a 51 y.o. female who presents for relapse anxiety disorder and panic attacks. She has the following anxiety symptoms: difficulty concentrating, dizziness, fatigue, feelings of losing control, insomnia, palpitations and panic attacks. Onset of symptoms was approximately 1 week ago. Symptoms have been gradually worsening since that time. She denies current suicidal and homicidal ideation. Family history significant for heart disease. Risk factors: none. Previous treatment includes Xanax. She complains of the following medication side effects: none.  Pt father called 911 because her mother was in diabetic coma and when 911 got there he dropped dead of MI.  Pt mother is not home but patient is under a lot of stress.  The following portions of the patient's history were reviewed and updated as appropriate: allergies, current medications, past family history, past medical history, past social history, past surgical history and problem list.  Review of Systems Pertinent items are noted in HPI.    Objective:    BP 130/80  Pulse 80  Temp(Src) 99.4 F (37.4 C) (Oral)  Wt 207 lb (93.895 kg)  SpO2 98% General appearance: alert, cooperative, appears stated age and mild distress Lungs: clear to auscultation bilaterally Heart: S1, S2 normal Extremities: extremities normal, atraumatic, no cyanosis or edema   psych--- crying,  Very upset and anxious,  Not suicidal Assessment:    anxiety disorder and panic attacks. Possible organic contributing causes are: none.   Plan:    Medications: Celexa and Xanax. Handouts describing disease, natural history, and treatment were given to the patient. Instructed patient to contact office or on-call physician promptly should condition worsen or any new symptoms appear and provided on-call telephone numbers. IF THE PATIENT HAS ANY SUICIDAL OR HOMICIDAL IDEATIONS, CALL THE OFFICE, DISCUSS WITH A SUPPORT MEMBER, OR GO TO THE ER  IMMEDIATELY. Patient was agreeable with this plan. Follow up: 1 month. Spent 30 minutes (>50% of visit) discussing the risks of anxiety disorder and panic attacks, the  pathophysiology, etiology, risks, and principles of treatment.

## 2011-06-18 ENCOUNTER — Telehealth: Payer: Self-pay | Admitting: Oncology

## 2011-06-18 NOTE — Telephone Encounter (Signed)
S/w pt re appts for 6/7 and 6/14.

## 2011-06-23 ENCOUNTER — Other Ambulatory Visit: Payer: Self-pay | Admitting: Family Medicine

## 2011-07-25 ENCOUNTER — Other Ambulatory Visit: Payer: Self-pay | Admitting: Family Medicine

## 2011-07-25 NOTE — Telephone Encounter (Signed)
Last seen and filled 06/09/11. Please advise    KP

## 2011-07-26 MED ORDER — ALPRAZOLAM 0.25 MG PO TABS: 0.2500 mg | ORAL_TABLET | Freq: Three times a day (TID) | ORAL | Status: DC | PRN

## 2011-07-26 NOTE — Telephone Encounter (Signed)
Addended by: Arnette Norris on: 07/26/2011 10:29 AM   Modules accepted: Orders

## 2011-07-26 NOTE — Telephone Encounter (Signed)
Faxed.   KP 

## 2011-08-10 ENCOUNTER — Ambulatory Visit (INDEPENDENT_AMBULATORY_CARE_PROVIDER_SITE_OTHER): Payer: BC Managed Care – PPO | Admitting: Family Medicine

## 2011-08-10 ENCOUNTER — Encounter: Payer: Self-pay | Admitting: Family Medicine

## 2011-08-10 VITALS — BP 130/80 | HR 73 | Temp 99.1°F | Ht 66.75 in | Wt 201.0 lb

## 2011-08-10 DIAGNOSIS — J209 Acute bronchitis, unspecified: Secondary | ICD-10-CM

## 2011-08-10 MED ORDER — GUAIFENESIN-CODEINE 100-10 MG/5ML PO SYRP: 10.0000 mL | ORAL_SOLUTION | Freq: Three times a day (TID) | ORAL | Status: AC | PRN

## 2011-08-10 MED ORDER — BENZONATATE 200 MG PO CAPS: 200.0000 mg | ORAL_CAPSULE | Freq: Three times a day (TID) | ORAL | Status: AC | PRN

## 2011-08-10 MED ORDER — AZITHROMYCIN 250 MG PO TABS: ORAL_TABLET | ORAL | Status: AC

## 2011-08-10 NOTE — Patient Instructions (Signed)
This is a bronchitis Start the Zpack as directed Continue mucinex Increase your fluids Cough meds as needed REST! Hang in there!

## 2011-08-10 NOTE — Assessment & Plan Note (Signed)
New.  Pt's sxs and physical exam consistent w/ infxn.  Start abx.  Cough meds prn.  Reviewed supportive care and red flags that should prompt return.  Pt expressed understanding and is in agreement w/ plan.

## 2011-08-10 NOTE — Progress Notes (Signed)
  Subjective:    Patient ID: Norma Kelley, female    DOB: 12/01/1960, 51 y.o.   MRN: [id number]  HPI URI- sxs started Saturday.  Entire family is sick.  Low grade temp today, had fever on Saturday and Sunday.  + nasal congestion, facial pressure.  Bilateral ear fullness.  + cough- intermittently productive.   Review of Systems For ROS see HPI     Objective:   Physical Exam  Vitals reviewed. Constitutional: She appears well-developed and well-nourished. No distress.  HENT:  Head: Normocephalic and atraumatic.       TMs normal bilaterally Mild nasal congestion Throat w/out erythema, edema, or exudate  Eyes: Conjunctivae and EOM are normal. Pupils are equal, round, and reactive to light.  Neck: Normal range of motion. Neck supple.  Cardiovascular: Normal rate, regular rhythm, normal heart sounds and intact distal pulses.   No murmur heard. Pulmonary/Chest: Effort normal and breath sounds normal. No respiratory distress. She has no wheezes.       + hacking cough  Lymphadenopathy:    She has no cervical adenopathy.          Assessment & Plan:

## 2011-08-20 ENCOUNTER — Other Ambulatory Visit: Payer: Self-pay | Admitting: Family Medicine

## 2011-08-23 NOTE — Telephone Encounter (Signed)
Last OV 08-10-11, last filled 07-26-11 #60

## 2011-08-23 NOTE — Telephone Encounter (Signed)
Rx sent 

## 2011-08-23 NOTE — Telephone Encounter (Signed)
OK X1 

## 2011-09-19 ENCOUNTER — Other Ambulatory Visit: Payer: Self-pay | Admitting: Internal Medicine

## 2011-09-19 NOTE — Telephone Encounter (Signed)
Last seen 08/10/11 and filled 08/20/11 # 60. Please advise    KP

## 2011-10-14 ENCOUNTER — Other Ambulatory Visit (HOSPITAL_BASED_OUTPATIENT_CLINIC_OR_DEPARTMENT_OTHER): Payer: BC Managed Care – PPO

## 2011-10-14 DIAGNOSIS — Z170 Estrogen receptor positive status [ER+]: Secondary | ICD-10-CM

## 2011-10-14 DIAGNOSIS — C50919 Malignant neoplasm of unspecified site of unspecified female breast: Secondary | ICD-10-CM

## 2011-10-14 LAB — CBC WITH DIFFERENTIAL/PLATELET
BASO%: 0.5 % (ref 0.0–2.0)
Eosinophils Absolute: 0.3 10*3/uL (ref 0.0–0.5)
HCT: 38.9 % (ref 34.8–46.6)
LYMPH%: 26 % (ref 14.0–49.7)
MCHC: 33.1 g/dL (ref 31.5–36.0)
MONO#: 0.6 10*3/uL (ref 0.1–0.9)
NEUT#: 6.6 10*3/uL — ABNORMAL HIGH (ref 1.5–6.5)
NEUT%: 65.3 % (ref 38.4–76.8)
Platelets: 282 10*3/uL (ref 145–400)
RBC: 4.5 10*6/uL (ref 3.70–5.45)
WBC: 10.1 10*3/uL (ref 3.9–10.3)
lymph#: 2.6 10*3/uL (ref 0.9–3.3)

## 2011-10-15 LAB — COMPREHENSIVE METABOLIC PANEL
ALT: 14 U/L (ref 0–35)
Albumin: 4.8 g/dL (ref 3.5–5.2)
CO2: 20 mEq/L (ref 19–32)
Calcium: 9.7 mg/dL (ref 8.4–10.5)
Chloride: 106 mEq/L (ref 96–112)
Glucose, Bld: 87 mg/dL (ref 70–99)
Sodium: 140 mEq/L (ref 135–145)
Total Protein: 7.2 g/dL (ref 6.0–8.3)

## 2011-10-15 LAB — VITAMIN D 25 HYDROXY (VIT D DEFICIENCY, FRACTURES): Vit D, 25-Hydroxy: 31 ng/mL (ref 30–89)

## 2011-10-16 ENCOUNTER — Other Ambulatory Visit: Payer: Self-pay | Admitting: Family Medicine

## 2011-10-17 NOTE — Telephone Encounter (Signed)
Last seen 06/09/11 and filled 09/19/11 please advise    KP

## 2011-10-21 ENCOUNTER — Ambulatory Visit (HOSPITAL_BASED_OUTPATIENT_CLINIC_OR_DEPARTMENT_OTHER): Payer: BC Managed Care – PPO | Admitting: Oncology

## 2011-10-21 ENCOUNTER — Other Ambulatory Visit: Payer: Self-pay | Admitting: Oncology

## 2011-10-21 VITALS — BP 137/72 | HR 80 | Temp 98.4°F | Ht 66.75 in | Wt 205.2 lb

## 2011-10-21 DIAGNOSIS — Z853 Personal history of malignant neoplasm of breast: Secondary | ICD-10-CM

## 2011-10-21 DIAGNOSIS — C50919 Malignant neoplasm of unspecified site of unspecified female breast: Secondary | ICD-10-CM

## 2011-10-21 NOTE — Progress Notes (Signed)
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  ROS:  Patient returns for followup. Since being seen last her back pain is resolved after having had a laminectomy. Unfortunately her father passed away in 06-11-2022. Her son is actually living with her mother currently. She continues to work at [employer]. She has no complaints. She has noted a firm indurated area deal portion of her reconstructed breast which is been there for some time.  Allergies  Allergen Reactions  . Sulfamethoxazole     REACTION: unspecified  . Sulfonamide Derivatives     Current Outpatient Prescriptions  Medication Sig Dispense Refill  . ALPRAZolam (XANAX) 0.25 MG tablet TAKE 1 TABLET BY MOUTH THREE TIMES DAILY AS NEEDED  60 tablet  0  . aspirin 81 MG tablet Take 81 mg by mouth daily.      Marland Kitchen FLUoxetine (PROZAC) 20 MG capsule Take 20 mg by mouth daily.      Marland Kitchen levothyroxine (SYNTHROID, LEVOTHROID) 125 MCG tablet 1 po qd  30 tablet  11  . Multiple Vitamin (MULTIVITAMIN) tablet Take 1 tablet by mouth daily.      . Omeprazole ([facility] OMEPRAZOLE) 20 MG TBEC Take 1 tablet by mouth.       . valACYclovir (VALTREX) 500 MG tablet TAKE 1 TABLET BY MOUTH ONCE DAILY AS NEEDED  30 tablet  2  . FLUoxetine (PROZAC) 20 MG tablet Take 1 tablet (20 mg total) by mouth daily.  30 tablet  3  . mometasone (ELOCON) 0.1 % cream Apply to affected area daily  45 g  1     Objective:  Filed Vitals:   10/21/11 1454  BP: 137/72  Pulse: 80  Temp: 98.4 F (36.9 C)    BMI: Body mass index is 32.38 kg/(m^2).   ECOG FS:  Physical Exam: Delightful woman looking stated age in no acute distress  Sclerae unicteric  Oropharynx clear  No peripheral adenopathy  Lungs clear -- no rales or rhonchi  Heart regular rate and rhythm  Abdomen benign  MSK no focal spinal tenderness, no  peripheral edema  Neuro nonfocal  Breast exam: Right breast normal, right axilla normal. 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 In the medial portion of the breast there is an indurated area which is nontender and non-inflammatory appearing. I suspect this is fat necrosis.  Lab Results:      Chemistry      Component Value Date/Time   NA 140 10/14/2011 1356   K 3.8 10/14/2011 1356   CL 106 10/14/2011 1356   CO2 20 10/14/2011 1356   BUN 13 10/14/2011 1356   CREATININE 0.90 10/14/2011 1356      Component Value Date/Time   CALCIUM 9.7 10/14/2011 1356   CALCIUM 9.5 10/07/2008 2347   ALKPHOS 83 10/14/2011 1356   AST 16 10/14/2011 1356   ALT 14 10/14/2011 1356   BILITOT 0.7 10/14/2011 1356       Lab Results  Component Value Date   WBC 10.1 10/14/2011   HGB 12.9 10/14/2011   HCT 38.9 10/14/2011   MCV 86.4 10/14/2011   PLT 282 10/14/2011   NEUTROABS 6.6* 10/14/2011    Studies/Results:  No results found.  Assessment: DCIS status post mastectomy TRAM flap reconstruction status post 5 years of tamoxifen on observation Plan: I discussed this with the patient.  I recommended an ultrasound of the area under (. Assuming that we negative for recurrence I would then discharge her from our practice. I've encouraged her to get annual screening mammography, routine health care maintenance. I am available to her should the need arise. I have not scheduled her for routine followup        Norma Kelley 10/21/2011

## 2011-10-28 ENCOUNTER — Other Ambulatory Visit: Payer: Self-pay | Admitting: *Deleted

## 2011-10-28 DIAGNOSIS — Z853 Personal history of malignant neoplasm of breast: Secondary | ICD-10-CM

## 2011-11-02 ENCOUNTER — Telehealth: Payer: Self-pay | Admitting: Family Medicine

## 2011-11-02 NOTE — Telephone Encounter (Signed)
If she wants to change meds I need to see her.   She is on a low dose of prozac--- we can increase itto 30 -  Or 40 mg to see if that works

## 2011-11-02 NOTE — Telephone Encounter (Signed)
Discussed with patient No homicidial or suicidal thoughts, she stated she had been on Prozac and xanax and she does not feel like they are working, she stated she has good days and bad days bu is is sad more often than normal. Patient wants to change medication. I have scheduled the patient for Friday at 1:30 just case you wanted to see her.    Please advise    KP

## 2011-11-02 NOTE — Telephone Encounter (Signed)
Caller: Norma Kelley/Patient; PCP: Lelon Perla.; CB#: [phone]; Call regarding Anxiety/Depression; Asking about changing Prosac to different medication.  Feels Prosac is not helping.  Mom is elderly diabetic- not doing well, Dad died June 18, 2022. LMP hysterectomy.  Increased feelings of sadness.  Feels she is not in any danger.  Advised to see MD within 24 hrs for new or increasing symptoms and taking meds/following therapy as prescribed per Depression Guideline. No appts remain with Dr Laury Axon for 11/03/11; willing to see Dr Laury Axon on 11/04/11.  Transferred to office/Kim for future appt.

## 2011-11-03 ENCOUNTER — Other Ambulatory Visit: Payer: BC Managed Care – PPO

## 2011-11-03 NOTE — Telephone Encounter (Signed)
patient aware to keep her apt for tomorrow at 1:30 and she agreed to do so.     KP

## 2011-11-03 NOTE — Telephone Encounter (Signed)
We will see her friday

## 2011-11-03 NOTE — Telephone Encounter (Signed)
Do you want to increase it or do you want her to just keep the apt for Friday?    please advise    KP

## 2011-11-04 ENCOUNTER — Ambulatory Visit (INDEPENDENT_AMBULATORY_CARE_PROVIDER_SITE_OTHER): Payer: BC Managed Care – PPO | Admitting: Family Medicine

## 2011-11-04 ENCOUNTER — Encounter: Payer: Self-pay | Admitting: Family Medicine

## 2011-11-04 VITALS — BP 122/72 | HR 76 | Temp 98.4°F | Wt 208.8 lb

## 2011-11-04 DIAGNOSIS — F419 Anxiety disorder, unspecified: Secondary | ICD-10-CM

## 2011-11-04 DIAGNOSIS — F411 Generalized anxiety disorder: Secondary | ICD-10-CM

## 2011-11-04 DIAGNOSIS — E039 Hypothyroidism, unspecified: Secondary | ICD-10-CM

## 2011-11-04 MED ORDER — CITALOPRAM HYDROBROMIDE 20 MG PO TABS: 20.0000 mg | ORAL_TABLET | Freq: Every day | ORAL | Status: DC

## 2011-11-04 MED ORDER — ALPRAZOLAM 0.25 MG PO TABS: 0.2500 mg | ORAL_TABLET | Freq: Three times a day (TID) | ORAL | Status: DC | PRN

## 2011-11-04 NOTE — Progress Notes (Signed)
  Subjective:     Norma Kelley is a 51 y.o. female who presents for follow up of anxiety disorder. Current symptoms: fatigue, insomnia. She denies current suicidal and homicidal ideation. She complains of the following side effects from the treatment: not working.  The following portions of the patient's history were reviewed and updated as appropriate: allergies, current medications, past family history, past medical history, past social history, past surgical history and problem list.    Objective:    BP 122/72  Pulse 76  Temp 98.4 F (36.9 C) (Oral)  Wt 208 lb 12.8 oz (94.711 kg)  SpO2 97%  General:  alert, cooperative, appears stated age and no distress  Affect/Behavior:  full facial expressions, good grooming, good insight, normal perception, normal reasoning, normal speech pattern and content and normal thought patterns tearing      Assessment:    Anxiety Disorder - unchanged, improving   hypothyroid--- check labs Plan:    Medications: Celexa and Xanax. Instructed patient to contact office or on-call physician promptly should condition worsen or any new symptoms appear and provided on-call telephone numbers. IF THE PATIENT HAS ANY SUICIDAL OR HOMICIDAL IDEATIONS, CALL THE OFFICE, DISCUSS WITH A SUPPORT MEMBER, OR GO TO THE ER IMMEDIATELY. Patient was agreeable with this plan. Follow up: 4 weeks.

## 2011-11-04 NOTE — Patient Instructions (Signed)

## 2011-12-02 ENCOUNTER — Other Ambulatory Visit: Payer: Self-pay | Admitting: Family Medicine

## 2011-12-02 NOTE — Telephone Encounter (Signed)
Last seen and filled 11/04/11 # 60. please advise   KP

## 2011-12-27 ENCOUNTER — Other Ambulatory Visit: Payer: Self-pay | Admitting: Family Medicine

## 2011-12-27 NOTE — Telephone Encounter (Signed)
Last seen 11/04/11 and filled 12/02/11 #60. Please advise    KP

## 2012-01-13 ENCOUNTER — Other Ambulatory Visit: Payer: Self-pay | Admitting: Family Medicine

## 2012-01-22 ENCOUNTER — Other Ambulatory Visit: Payer: Self-pay | Admitting: Family Medicine

## 2012-01-23 NOTE — Telephone Encounter (Signed)
Last seen 11/04/11 and filled 12/27/11 # 60. Please advise     KP

## 2012-02-15 ENCOUNTER — Other Ambulatory Visit: Payer: Self-pay | Admitting: Family Medicine

## 2012-02-15 NOTE — Telephone Encounter (Signed)
Last OV 11/04/11. Last filled 01/22/12 #60 no refills.  Need okay       MW

## 2012-02-16 NOTE — Telephone Encounter (Signed)
Called pt to advise Rx ready for pick up.     MW

## 2012-02-17 ENCOUNTER — Telehealth: Payer: Self-pay | Admitting: Family Medicine

## 2012-02-17 NOTE — Telephone Encounter (Signed)
Called pharmacy to see if pt Rx had been faxed in since it wasn't I did a phone Rx. Spoke with pt to advise Rx at pharmacy.  Pt stated understanding.     MW

## 2012-02-17 NOTE — Telephone Encounter (Signed)
Pt called confused about where RX is at- where in office (I didn't see in presc box up front) or at pharm, pls call her at work.

## 2012-03-09 ENCOUNTER — Other Ambulatory Visit: Payer: Self-pay | Admitting: Family Medicine

## 2012-03-09 NOTE — Telephone Encounter (Signed)
Last seen 11/04/11 and filled 02/15/12 # 60. Please advise    KP 

## 2012-03-15 ENCOUNTER — Other Ambulatory Visit: Payer: Self-pay | Admitting: Family Medicine

## 2012-03-15 NOTE — Telephone Encounter (Signed)
Last seen 11/04/11 and filled 02/15/12 # 60. Please advise    KP

## 2012-04-12 ENCOUNTER — Other Ambulatory Visit: Payer: Self-pay | Admitting: Family Medicine

## 2012-04-12 MED ORDER — ALPRAZOLAM 0.25 MG PO TABS: 0.2500 mg | ORAL_TABLET | Freq: Three times a day (TID) | ORAL | Status: DC | PRN

## 2012-04-12 NOTE — Telephone Encounter (Signed)
OK x1.The medication is " as needed" and should not be taken on a regular basis. It should be taken :" one half to one every 8-12 hours as needed only". Regular use can increase risk of addiction but more importantly affect level of alertness and balance with increased risk of falling.

## 2012-04-12 NOTE — Telephone Encounter (Signed)
Last seen 11/04/11 and filled 03/15/12 #60. Please advise      KP

## 2012-05-07 ENCOUNTER — Other Ambulatory Visit: Payer: Self-pay | Admitting: Internal Medicine

## 2012-05-08 NOTE — Telephone Encounter (Signed)
Last seen 11/04/11 and filled 04/12/12 #60. Please advise     KP

## 2012-05-16 ENCOUNTER — Other Ambulatory Visit: Payer: BC Managed Care – PPO

## 2012-06-07 ENCOUNTER — Other Ambulatory Visit: Payer: Self-pay | Admitting: Family Medicine

## 2012-06-07 NOTE — Telephone Encounter (Signed)
Last seen 11/04/11 and filled 04/27/12  #60. Please advise       KP

## 2012-06-11 ENCOUNTER — Ambulatory Visit
Admission: RE | Admit: 2012-06-11 | Discharge: 2012-06-11 | Disposition: A | Discharge status: Home / Self Care | Payer: BC Managed Care – PPO | Source: Ambulatory Visit | Attending: Oncology | Admitting: Oncology

## 2012-06-11 DIAGNOSIS — Z853 Personal history of malignant neoplasm of breast: Secondary | ICD-10-CM

## 2012-06-11 DIAGNOSIS — C50919 Malignant neoplasm of unspecified site of unspecified female breast: Secondary | ICD-10-CM

## 2012-06-23 ENCOUNTER — Other Ambulatory Visit: Payer: Self-pay

## 2012-06-28 ENCOUNTER — Other Ambulatory Visit: Payer: Self-pay | Admitting: Family Medicine

## 2012-07-04 ENCOUNTER — Other Ambulatory Visit: Payer: Self-pay | Admitting: Family Medicine

## 2012-07-04 NOTE — Telephone Encounter (Signed)
Last seen 11/04/11 and filled 06/07/12 #60. Please advise      KP

## 2012-07-16 ENCOUNTER — Encounter: Payer: Self-pay | Admitting: Family Medicine

## 2012-07-16 ENCOUNTER — Ambulatory Visit (INDEPENDENT_AMBULATORY_CARE_PROVIDER_SITE_OTHER): Payer: BC Managed Care – PPO | Admitting: Family Medicine

## 2012-07-16 ENCOUNTER — Encounter: Payer: Self-pay | Admitting: *Deleted

## 2012-07-16 VITALS — BP 122/64 | HR 79 | Temp 99.3°F | Ht 66.0 in | Wt 204.2 lb

## 2012-07-16 DIAGNOSIS — E890 Postprocedural hypothyroidism: Secondary | ICD-10-CM

## 2012-07-16 DIAGNOSIS — F411 Generalized anxiety disorder: Secondary | ICD-10-CM

## 2012-07-16 DIAGNOSIS — Z0000 Encounter for general adult medical examination without abnormal findings: Secondary | ICD-10-CM

## 2012-07-16 DIAGNOSIS — E2839 Other primary ovarian failure: Secondary | ICD-10-CM

## 2012-07-16 LAB — BASIC METABOLIC PANEL
BUN: 11 mg/dL (ref 6–23)
CO2: 25 mEq/L (ref 19–32)
Chloride: 107 mEq/L (ref 96–112)
Creatinine, Ser: 0.7 mg/dL (ref 0.4–1.2)
Glucose, Bld: 90 mg/dL (ref 70–99)
Potassium: 3.9 mEq/L (ref 3.5–5.1)

## 2012-07-16 LAB — HEPATIC FUNCTION PANEL
ALT: 16 U/L (ref 0–35)
Bilirubin, Direct: 0 mg/dL (ref 0.0–0.3)
Total Bilirubin: 0.9 mg/dL (ref 0.3–1.2)
Total Protein: 7.4 g/dL (ref 6.0–8.3)

## 2012-07-16 LAB — LIPID PANEL
Cholesterol: 141 mg/dL (ref 0–200)
HDL: 46.2 mg/dL (ref >39.00)
LDL Cholesterol: 76 mg/dL (ref 0–99)
Total CHOL/HDL Ratio: 3
Triglycerides: 95 mg/dL (ref 0.0–149.0)

## 2012-07-16 LAB — CBC WITH DIFFERENTIAL/PLATELET
Eosinophils Absolute: 0.1 10*3/uL (ref 0.0–0.7)
Eosinophils Relative: 1.5 % (ref 0.0–5.0)
HCT: 39.1 % (ref 36.0–46.0)
Lymphs Abs: 2 10*3/uL (ref 0.7–4.0)
MCHC: 33.7 g/dL (ref 30.0–36.0)
MCV: 87 fl (ref 78.0–100.0)
Monocytes Absolute: 0.6 10*3/uL (ref 0.1–1.0)
Neutrophils Relative %: 70.6 % (ref 43.0–77.0)
Platelets: 258 10*3/uL (ref 150.0–400.0)

## 2012-07-16 LAB — MICROALBUMIN / CREATININE URINE RATIO: Microalb Creat Ratio: 0.2 mg/g (ref 0.0–30.0)

## 2012-07-16 MED ORDER — ALPRAZOLAM 0.25 MG PO TABS: 0.2500 mg | ORAL_TABLET | Freq: Three times a day (TID) | ORAL | Status: DC | PRN

## 2012-07-16 NOTE — Patient Instructions (Addendum)
Preventive Care for Adults, Female A healthy lifestyle and preventive care can promote health and wellness. Preventive health guidelines for women include the following key practices.  A routine yearly physical is a good way to check with your caregiver about your health and preventive screening. It is a chance to share any concerns and updates on your health, and to receive a thorough exam.  Visit your dentist for a routine exam and preventive care every 6 months. Brush your teeth twice a day and floss once a day. Good oral hygiene prevents tooth decay and gum disease.  The frequency of eye exams is based on your age, health, family medical history, use of contact lenses, and other factors. Follow your caregiver's recommendations for frequency of eye exams.  Eat a healthy diet. Foods like vegetables, fruits, whole grains, low-fat dairy products, and lean protein foods contain the nutrients you need without too many calories. Decrease your intake of foods high in solid fats, added sugars, and salt. Eat the right amount of calories for you.Get information about a proper diet from your caregiver, if necessary.  Regular physical exercise is one of the most important things you can do for your health. Most adults should get at least 150 minutes of moderate-intensity exercise (any activity that increases your heart rate and causes you to sweat) each week. In addition, most adults need muscle-strengthening exercises on 2 or more days a week.  Maintain a healthy weight. The body mass index (BMI) is a screening tool to identify possible weight problems. It provides an estimate of body fat based on height and weight. Your caregiver can help determine your BMI, and can help you achieve or maintain a healthy weight.For adults 20 years and older:  A BMI below 18.5 is considered underweight.  A BMI of 18.5 to 24.9 is normal.  A BMI of 25 to 29.9 is considered overweight.  A BMI of 30 and above is  considered obese.  Maintain normal blood lipids and cholesterol levels by exercising and minimizing your intake of saturated fat. Eat a balanced diet with plenty of fruit and vegetables. Blood tests for lipids and cholesterol should begin at age 20 and be repeated every 5 years. If your lipid or cholesterol levels are high, you are over 50, or you are at high risk for heart disease, you may need your cholesterol levels checked more frequently.Ongoing high lipid and cholesterol levels should be treated with medicines if diet and exercise are not effective.  If you smoke, find out from your caregiver how to quit. If you do not use tobacco, do not start.  If you are pregnant, do not drink alcohol. If you are breastfeeding, be very cautious about drinking alcohol. If you are not pregnant and choose to drink alcohol, do not exceed 1 drink per day. One drink is considered to be 12 ounces (355 mL) of beer, 5 ounces (148 mL) of wine, or 1.5 ounces (44 mL) of liquor.  Avoid use of street drugs. Do not share needles with anyone. Ask for help if you need support or instructions about stopping the use of drugs.  High blood pressure causes heart disease and increases the risk of stroke. Your blood pressure should be checked at least every 1 to 2 years. Ongoing high blood pressure should be treated with medicines if weight loss and exercise are not effective.  If you are 55 to 54 years old, ask your caregiver if you should take aspirin to prevent strokes.  Diabetes   screening involves taking a blood sample to check your fasting blood sugar level. This should be done once every 3 years, after age 45, if you are within normal weight and without risk factors for diabetes. Testing should be considered at a younger age or be carried out more frequently if you are overweight and have at least 1 risk factor for diabetes.  Breast cancer screening is essential preventive care for women. You should practice "breast  self-awareness." This means understanding the normal appearance and feel of your breasts and may include breast self-examination. Any changes detected, no matter how small, should be reported to a caregiver. Women in their 20s and 30s should have a clinical breast exam (CBE) by a caregiver as part of a regular health exam every 1 to 3 years. After age 40, women should have a CBE every year. Starting at age 40, women should consider having a mammography (breast X-ray test) every year. Women who have a family history of breast cancer should talk to their caregiver about genetic screening. Women at a high risk of breast cancer should talk to their caregivers about having magnetic resonance imaging (MRI) and a mammography every year.  The Pap test is a screening test for cervical cancer. A Pap test can show cell changes on the cervix that might become cervical cancer if left untreated. A Pap test is a procedure in which cells are obtained and examined from the lower end of the uterus (cervix).  Women should have a Pap test starting at age 21.  Between ages 21 and 29, Pap tests should be repeated every 2 years.  Beginning at age 30, you should have a Pap test every 3 years as long as the past 3 Pap tests have been normal.  Some women have medical problems that increase the chance of getting cervical cancer. Talk to your caregiver about these problems. It is especially important to talk to your caregiver if a new problem develops soon after your last Pap test. In these cases, your caregiver may recommend more frequent screening and Pap tests.  The above recommendations are the same for women who have or have not gotten the vaccine for human papillomavirus (HPV).  If you had a hysterectomy for a problem that was not cancer or a condition that could lead to cancer, then you no longer need Pap tests. Even if you no longer need a Pap test, a regular exam is a good idea to make sure no other problems are  starting.  If you are between ages 65 and 70, and you have had normal Pap tests going back 10 years, you no longer need Pap tests. Even if you no longer need a Pap test, a regular exam is a good idea to make sure no other problems are starting.  If you have had past treatment for cervical cancer or a condition that could lead to cancer, you need Pap tests and screening for cancer for at least 20 years after your treatment.  If Pap tests have been discontinued, risk factors (such as a new sexual partner) need to be reassessed to determine if screening should be resumed.  The HPV test is an additional test that may be used for cervical cancer screening. The HPV test looks for the virus that can cause the cell changes on the cervix. The cells collected during the Pap test can be tested for HPV. The HPV test could be used to screen women aged 30 years and older, and should   be used in women of any age who have unclear Pap test results. After the age of 30, women should have HPV testing at the same frequency as a Pap test.  Colorectal cancer can be detected and often prevented. Most routine colorectal cancer screening begins at the age of 50 and continues through age 75. However, your caregiver may recommend screening at an earlier age if you have risk factors for colon cancer. On a yearly basis, your caregiver may provide home test kits to check for hidden blood in the stool. Use of a small camera at the end of a tube, to directly examine the colon (sigmoidoscopy or colonoscopy), can detect the earliest forms of colorectal cancer. Talk to your caregiver about this at age 50, when routine screening begins. Direct examination of the colon should be repeated every 5 to 10 years through age 75, unless early forms of pre-cancerous polyps or small growths are found.  Hepatitis C blood testing is recommended for all people born from 1945 through 1965 and any individual with known risks for hepatitis C.  Practice  safe sex. Use condoms and avoid high-risk sexual practices to reduce the spread of sexually transmitted infections (STIs). STIs include gonorrhea, chlamydia, syphilis, trichomonas, herpes, HPV, and human immunodeficiency virus (HIV). Herpes, HIV, and HPV are viral illnesses that have no cure. They can result in disability, cancer, and death. Sexually active women aged 25 and younger should be checked for chlamydia. Older women with new or multiple partners should also be tested for chlamydia. Testing for other STIs is recommended if you are sexually active and at increased risk.  Osteoporosis is a disease in which the bones lose minerals and strength with aging. This can result in serious bone fractures. The risk of osteoporosis can be identified using a bone density scan. Women ages 65 and over and women at risk for fractures or osteoporosis should discuss screening with their caregivers. Ask your caregiver whether you should take a calcium supplement or vitamin D to reduce the rate of osteoporosis.  Menopause can be associated with physical symptoms and risks. Hormone replacement therapy is available to decrease symptoms and risks. You should talk to your caregiver about whether hormone replacement therapy is right for you.  Use sunscreen with sun protection factor (SPF) of 30 or more. Apply sunscreen liberally and repeatedly throughout the day. You should seek shade when your shadow is shorter than you. Protect yourself by wearing long sleeves, pants, a wide-brimmed hat, and sunglasses year round, whenever you are outdoors.  Once a month, do a whole body skin exam, using a mirror to look at the skin on your back. Notify your caregiver of new moles, moles that have irregular borders, moles that are larger than a pencil eraser, or moles that have changed in shape or color.  Stay current with required immunizations.  Influenza. You need a dose every fall (or winter). The composition of the flu vaccine  changes each year, so being vaccinated once is not enough.  Pneumococcal polysaccharide. You need 1 to 2 doses if you smoke cigarettes or if you have certain chronic medical conditions. You need 1 dose at age 65 (or older) if you have never been vaccinated.  Tetanus, diphtheria, pertussis (Tdap, Td). Get 1 dose of Tdap vaccine if you are younger than age 65, are over 65 and have contact with an infant, are a healthcare worker, are pregnant, or simply want to be protected from whooping cough. After that, you need a Td   booster dose every 10 years. Consult your caregiver if you have not had at least 3 tetanus and diphtheria-containing shots sometime in your life or have a deep or dirty wound.  HPV. You need this vaccine if you are a woman age 26 or younger. The vaccine is given in 3 doses over 6 months.  Measles, mumps, rubella (MMR). You need at least 1 dose of MMR if you were born in 1957 or later. You may also need a second dose.  Meningococcal. If you are age 19 to 21 and a first-year college student living in a residence hall, or have one of several medical conditions, you need to get vaccinated against meningococcal disease. You may also need additional booster doses.  Zoster (shingles). If you are age 60 or older, you should get this vaccine.  Varicella (chickenpox). If you have never had chickenpox or you were vaccinated but received only 1 dose, talk to your caregiver to find out if you need this vaccine.  Hepatitis A. You need this vaccine if you have a specific risk factor for hepatitis A virus infection or you simply wish to be protected from this disease. The vaccine is usually given as 2 doses, 6 to 18 months apart.  Hepatitis B. You need this vaccine if you have a specific risk factor for hepatitis B virus infection or you simply wish to be protected from this disease. The vaccine is given in 3 doses, usually over 6 months. Preventive Services / Frequency Ages 19 to 39  Blood  pressure check.** / Every 1 to 2 years.  Lipid and cholesterol check.** / Every 5 years beginning at age 20.  Clinical breast exam.** / Every 3 years for women in their 20s and 30s.  Pap test.** / Every 2 years from ages 21 through 29. Every 3 years starting at age 30 through age 65 or 70 with a history of 3 consecutive normal Pap tests.  HPV screening.** / Every 3 years from ages 30 through ages 65 to 70 with a history of 3 consecutive normal Pap tests.  Hepatitis C blood test.** / For any individual with known risks for hepatitis C.  Skin self-exam. / Monthly.  Influenza immunization.** / Every year.  Pneumococcal polysaccharide immunization.** / 1 to 2 doses if you smoke cigarettes or if you have certain chronic medical conditions.  Tetanus, diphtheria, pertussis (Tdap, Td) immunization. / A one-time dose of Tdap vaccine. After that, you need a Td booster dose every 10 years.  HPV immunization. / 3 doses over 6 months, if you are 26 and younger.  Measles, mumps, rubella (MMR) immunization. / You need at least 1 dose of MMR if you were born in 1957 or later. You may also need a second dose.  Meningococcal immunization. / 1 dose if you are age 19 to 21 and a first-year college student living in a residence hall, or have one of several medical conditions, you need to get vaccinated against meningococcal disease. You may also need additional booster doses.  Varicella immunization.** / Consult your caregiver.  Hepatitis A immunization.** / Consult your caregiver. 2 doses, 6 to 18 months apart.  Hepatitis B immunization.** / Consult your caregiver. 3 doses usually over 6 months. Ages 40 to 64  Blood pressure check.** / Every 1 to 2 years.  Lipid and cholesterol check.** / Every 5 years beginning at age 20.  Clinical breast exam.** / Every year after age 40.  Mammogram.** / Every year beginning at age 40   and continuing for as long as you are in good health. Consult with your  caregiver.  Pap test.** / Every 3 years starting at age 30 through age 65 or 70 with a history of 3 consecutive normal Pap tests.  HPV screening.** / Every 3 years from ages 30 through ages 65 to 70 with a history of 3 consecutive normal Pap tests.  Fecal occult blood test (FOBT) of stool. / Every year beginning at age 50 and continuing until age 75. You may not need to do this test if you get a colonoscopy every 10 years.  Flexible sigmoidoscopy or colonoscopy.** / Every 5 years for a flexible sigmoidoscopy or every 10 years for a colonoscopy beginning at age 50 and continuing until age 75.  Hepatitis C blood test.** / For all people born from 1945 through 1965 and any individual with known risks for hepatitis C.  Skin self-exam. / Monthly.  Influenza immunization.** / Every year.  Pneumococcal polysaccharide immunization.** / 1 to 2 doses if you smoke cigarettes or if you have certain chronic medical conditions.  Tetanus, diphtheria, pertussis (Tdap, Td) immunization.** / A one-time dose of Tdap vaccine. After that, you need a Td booster dose every 10 years.  Measles, mumps, rubella (MMR) immunization. / You need at least 1 dose of MMR if you were born in 1957 or later. You may also need a second dose.  Varicella immunization.** / Consult your caregiver.  Meningococcal immunization.** / Consult your caregiver.  Hepatitis A immunization.** / Consult your caregiver. 2 doses, 6 to 18 months apart.  Hepatitis B immunization.** / Consult your caregiver. 3 doses, usually over 6 months. Ages 65 and over  Blood pressure check.** / Every 1 to 2 years.  Lipid and cholesterol check.** / Every 5 years beginning at age 20.  Clinical breast exam.** / Every year after age 40.  Mammogram.** / Every year beginning at age 40 and continuing for as long as you are in good health. Consult with your caregiver.  Pap test.** / Every 3 years starting at age 30 through age 65 or 70 with a 3  consecutive normal Pap tests. Testing can be stopped between 65 and 70 with 3 consecutive normal Pap tests and no abnormal Pap or HPV tests in the past 10 years.  HPV screening.** / Every 3 years from ages 30 through ages 65 or 70 with a history of 3 consecutive normal Pap tests. Testing can be stopped between 65 and 70 with 3 consecutive normal Pap tests and no abnormal Pap or HPV tests in the past 10 years.  Fecal occult blood test (FOBT) of stool. / Every year beginning at age 50 and continuing until age 75. You may not need to do this test if you get a colonoscopy every 10 years.  Flexible sigmoidoscopy or colonoscopy.** / Every 5 years for a flexible sigmoidoscopy or every 10 years for a colonoscopy beginning at age 50 and continuing until age 75.  Hepatitis C blood test.** / For all people born from 1945 through 1965 and any individual with known risks for hepatitis C.  Osteoporosis screening.** / A one-time screening for women ages 65 and over and women at risk for fractures or osteoporosis.  Skin self-exam. / Monthly.  Influenza immunization.** / Every year.  Pneumococcal polysaccharide immunization.** / 1 dose at age 65 (or older) if you have never been vaccinated.  Tetanus, diphtheria, pertussis (Tdap, Td) immunization. / A one-time dose of Tdap vaccine if you are over   65 and have contact with an infant, are a healthcare worker, or simply want to be protected from whooping cough. After that, you need a Td booster dose every 10 years.  Varicella immunization.** / Consult your caregiver.  Meningococcal immunization.** / Consult your caregiver.  Hepatitis A immunization.** / Consult your caregiver. 2 doses, 6 to 18 months apart.  Hepatitis B immunization.** / Check with your caregiver. 3 doses, usually over 6 months. ** Family history and personal history of risk and conditions may change your caregiver's recommendations. Document Released: 06/21/2001 Document Revised: 07/18/2011  Document Reviewed: 09/20/2010 [employer] Patient Information 2013 [employer].  

## 2012-07-16 NOTE — Progress Notes (Signed)
Subjective:     Norma Kelley is a 52 y.o. female and is here for a comprehensive physical exam. The patient reports no problems.  History   Social History  . Marital Status: Married    Spouse Name: N/A    Number of Children: N/A  . Years of Education: N/A   Occupational History  . Not on file.   Social History Main Topics  . Smoking status: Never Smoker   . Smokeless tobacco: Never Used  . Alcohol Use: No  . Drug Use: No  . Sexually Active: Yes   Other Topics Concern  . Not on file   Social History Narrative   REGULAR EXERCISE   Health Maintenance  Topic Date Due  . Influenza Vaccine  01/08/1959  . Tetanus/tdap  08/21/1977  . Mammogram  06/11/2014  . Colonoscopy  09/23/2018    The following portions of the patient's history were reviewed and updated as appropriate:  She  has a past medical history of Anxiety; Breast cancer; Depression; and Thyroid disease. She  does not have any pertinent problems on file. She  has past surgical history that includes Abdominal hysterectomy and Tonsillectomy. Her family history includes Coronary artery disease in an unspecified family member; Diabetes in her mother; Heart disease in her father; and Osteoporosis in an unspecified family member. She  reports that she has never smoked. She has never used smokeless tobacco. She reports that she does not drink alcohol or use illicit drugs. She has a current medication list which includes the following prescription(s): alprazolam, aspirin, levothyroxine, mometasone, multivitamin, omeprazole, and valacyclovir. Current Outpatient Prescriptions on File Prior to Visit  Medication Sig Dispense Refill  . aspirin 81 MG tablet Take 81 mg by mouth daily.      Marland Kitchen levothyroxine (SYNTHROID, LEVOTHROID) 125 MCG tablet TAKE 1 TABLET BY MOUTH EVERY DAY  30 tablet  10  . mometasone (ELOCON) 0.1 % cream APPLY TO AFFECTED AREA DAILY  45 g  1  . Multiple Vitamin (MULTIVITAMIN) tablet Take 1 tablet by mouth  daily.      . Omeprazole ([facility] OMEPRAZOLE) 20 MG TBEC Take 1 tablet by mouth.       . valACYclovir (VALTREX) 500 MG tablet TAKE 1 TABLET BY MOUTH ONCE DAILY AS NEEDED  30 tablet  0   No current facility-administered medications on file prior to visit.   She is allergic to sulfamethoxazole and sulfonamide derivatives..  Review of Systems Review of Systems  Constitutional: Negative for activity change, appetite change and fatigue.  HENT: Negative for hearing loss, congestion, tinnitus and ear discharge.  dentist q63m Eyes: Negative for visual disturbance (see optho q1y -- vision corrected to 20/20 with glasses).  Respiratory: Negative for cough, chest tightness and shortness of breath.   Cardiovascular: Negative for chest pain, palpitations and leg swelling.  Gastrointestinal: Negative for abdominal pain, diarrhea, constipation and abdominal distention.  Genitourinary: Negative for urgency, frequency, decreased urine volume and difficulty urinating.  Musculoskeletal: Negative for back pain, arthralgias and gait problem.  Skin: Negative for color change, pallor and rash.  Neurological: Negative for dizziness, light-headedness, numbness and headaches.  Hematological: Negative for adenopathy. Does not bruise/bleed easily.  Psychiatric/Behavioral: Negative for suicidal ideas, confusion, sleep disturbance, self-injury, dysphoric mood, decreased concentration and agitation.       Objective:    BP 122/64  Pulse 79  Temp(Src) 99.3 F (37.4 C) (Oral)  Ht 5\' 6"  (1.676 m)  Wt 204 lb 3.2 oz (92.625 kg)  BMI 32.97  kg/m2  SpO2 99% General appearance: alert, cooperative, appears stated age and no distress Head: Normocephalic, without obvious abnormality, atraumatic Eyes: conjunctivae/corneas clear. PERRL, EOM's intact. Fundi benign. Ears: normal TM's and external ear canals both ears Nose: Nares normal. Septum midline. Mucosa normal. No drainage or sinus tenderness. Throat: lips, mucosa, and  tongue normal; teeth and gums normal Neck: no adenopathy, no carotid bruit, no JVD, supple, symmetrical, trachea midline and thyroid not enlarged, symmetric, no tenderness/mass/nodules Back: symmetric, no curvature. ROM normal. No CVA tenderness. Lungs: clear to auscultation bilaterally Breasts: normal appearance, no masses or tenderness, L masectomy with flap Heart: regular rate and rhythm, S1, S2 normal, no murmur, click, rub or gallop Abdomen: soft, non-tender; bowel sounds normal; no masses,  no organomegaly Pelvic: external genitalia normal, no adnexal masses or tenderness, rectovaginal septum normal, uterus surgically absent and vagina normal without discharge Extremities: extremities normal, atraumatic, no cyanosis or edema Pulses: 2+ and symmetric Skin: Skin color, texture, turgor normal. No rashes or lesions Lymph nodes: Cervical, supraclavicular, and axillary nodes normal. Neurologic: Alert and oriented X 3, normal strength and tone. Normal symmetric reflexes. Normal coordination and gait Psych-- no anxiety, no depression      Assessment:    Healthy female exam.      Plan:    ghm utd Check bmd Check labs See After Visit Summary for Counseling Recommendations

## 2012-07-16 NOTE — Assessment & Plan Note (Signed)
Pt aware that if her use of xanax increases we will need to discuss options

## 2012-07-16 NOTE — Assessment & Plan Note (Signed)
Per endo °

## 2012-07-17 ENCOUNTER — Encounter: Payer: Self-pay | Admitting: Family Medicine

## 2012-07-17 LAB — TSH: TSH: 0.66 u[IU]/mL (ref 0.35–5.50)

## 2012-07-26 ENCOUNTER — Other Ambulatory Visit: Payer: BC Managed Care – PPO

## 2012-08-20 ENCOUNTER — Other Ambulatory Visit: Payer: Self-pay | Admitting: Family Medicine

## 2012-08-20 NOTE — Telephone Encounter (Signed)
Last seen and filled  07/16/12 #60. Please advise     KP

## 2012-08-21 MED ORDER — ALPRAZOLAM 0.25 MG PO TABS: ORAL_TABLET | ORAL | Status: DC

## 2012-08-21 NOTE — Addendum Note (Signed)
Addended by: Arnette Norris on: 08/21/2012 09:05 AM   Modules accepted: Orders

## 2012-09-18 ENCOUNTER — Other Ambulatory Visit: Payer: Self-pay | Admitting: Family Medicine

## 2012-09-18 NOTE — Telephone Encounter (Signed)
Last seen 07/16/12 and filled 08/21/12 #60. Please advise     KP

## 2012-09-19 ENCOUNTER — Other Ambulatory Visit: Payer: Self-pay | Admitting: Family Medicine

## 2012-10-16 ENCOUNTER — Other Ambulatory Visit: Payer: Self-pay | Admitting: Family Medicine

## 2012-10-16 NOTE — Telephone Encounter (Signed)
Last seen 07/16/12 and filled 09/18/12#60. Please advise     KP

## 2012-10-23 ENCOUNTER — Encounter: Payer: Self-pay | Admitting: Family Medicine

## 2012-10-23 ENCOUNTER — Ambulatory Visit (INDEPENDENT_AMBULATORY_CARE_PROVIDER_SITE_OTHER): Payer: BC Managed Care – PPO | Admitting: Family Medicine

## 2012-10-23 ENCOUNTER — Telehealth: Payer: Self-pay | Admitting: Family Medicine

## 2012-10-23 VITALS — BP 130/84 | HR 76 | Temp 98.9°F | Wt 198.8 lb

## 2012-10-23 DIAGNOSIS — R42 Dizziness and giddiness: Secondary | ICD-10-CM

## 2012-10-23 DIAGNOSIS — R197 Diarrhea, unspecified: Secondary | ICD-10-CM

## 2012-10-23 DIAGNOSIS — R002 Palpitations: Secondary | ICD-10-CM

## 2012-10-23 NOTE — Telephone Encounter (Signed)
Pt called to speak with nurse/was put through to nurse for "possible dehydration/diarrhea" but pt wasn't on the line and RN could not reach pt back . Left vm to call office back when available.

## 2012-10-23 NOTE — Progress Notes (Signed)
  Subjective:     Norma Kelley is a 52 y.o. female who presents for evaluation of dizziness. The symptoms started 4 days ago and are improved. The attacks occur that one day with diarrhea and last 8 hours. Positions that worsen symptoms: standing up. Previous workup/treatments: none. Associated ear symptoms: none. Associated CNS symptoms: none. Recent infections: none. Head trauma: denied. Drug ingestion: none. Noise exposure: no occupational exposure. Family history: non-contributory.  The following portions of the patient's history were reviewed and updated as appropriate: allergies, current medications, past family history, past medical history, past social history, past surgical history and problem list.  Review of Systems Pertinent items are noted in HPI.    Objective:    BP 130/84  Pulse 76  Temp(Src) 98.9 F (37.2 C) (Oral)  Wt 198 lb 12.8 oz (90.175 kg)  BMI 32.1 kg/m2  SpO2 97% Ears: normal TM's and external ear canals both ears Nose: Nares normal. Septum midline. Mucosa normal. No drainage or sinus tenderness. Throat: lips, mucosa, and tongue normal; teeth and gums normal Neck: no adenopathy, no carotid bruit, no JVD, supple, symmetrical, trachea midline and thyroid not enlarged, symmetric, no tenderness/mass/nodules Lungs: clear to auscultation bilaterally Heart: S1, S2 normal Extremities: extremities normal, atraumatic, no cyanosis or edema      Assessment:    Vertigo    Plan:    ? dehydrated from being in hot sun and diarrhea  D/w pt rehydration Check labs

## 2012-10-23 NOTE — Telephone Encounter (Signed)
Patient is in office for an visit.      KP

## 2012-10-23 NOTE — Patient Instructions (Addendum)
Dehydration, Adult Dehydration is when you lose more fluids from the body than you take in. Vital organs like the kidneys, brain, and heart cannot function without a proper amount of fluids and salt. Any loss of fluids from the body can cause dehydration.  CAUSES   Vomiting.  Diarrhea.  Excessive sweating.  Excessive urine output.  Fever. SYMPTOMS  Mild dehydration  Thirst.  Dry lips.  Slightly dry mouth. Moderate dehydration  Very dry mouth.  Sunken eyes.  Skin does not bounce back quickly when lightly pinched and released.  Dark urine and decreased urine production.  Decreased tear production.  Headache. Severe dehydration  Very dry mouth.  Extreme thirst.  Rapid, weak pulse (more than 100 beats per minute at rest).  Cold hands and feet.  Not able to sweat in spite of heat and temperature.  Rapid breathing.  Blue lips.  Confusion and lethargy.  Difficulty being awakened.  Minimal urine production.  No tears. DIAGNOSIS  Your caregiver will diagnose dehydration based on your symptoms and your exam. Blood and urine tests will help confirm the diagnosis. The diagnostic evaluation should also identify the cause of dehydration. TREATMENT  Treatment of mild or moderate dehydration can often be done at home by increasing the amount of fluids that you drink. It is best to drink small amounts of fluid more often. Drinking too much at one time can make vomiting worse. Refer to the home care instructions below. Severe dehydration needs to be treated at the hospital where you will probably be given intravenous (IV) fluids that contain water and electrolytes. HOME CARE INSTRUCTIONS   Ask your caregiver about specific rehydration instructions.  Drink enough fluids to keep your urine clear or pale yellow.  Drink small amounts frequently if you have nausea and vomiting.  Eat as you normally do.  Avoid:  Foods or drinks high in sugar.  Carbonated  drinks.  Juice.  Extremely hot or cold fluids.  Drinks with caffeine.  Fatty, greasy foods.  Alcohol.  Tobacco.  Overeating.  Gelatin desserts.  Wash your hands well to avoid spreading bacteria and viruses.  Only take over-the-counter or prescription medicines for pain, discomfort, or fever as directed by your caregiver.  Ask your caregiver if you should continue all prescribed and over-the-counter medicines.  Keep all follow-up appointments with your caregiver. SEEK MEDICAL CARE IF:  You have abdominal pain and it increases or stays in one area (localizes).  You have a rash, stiff neck, or severe headache.  You are irritable, sleepy, or difficult to awaken.  You are weak, dizzy, or extremely thirsty. SEEK IMMEDIATE MEDICAL CARE IF:   You are unable to keep fluids down or you get worse despite treatment.  You have frequent episodes of vomiting or diarrhea.  You have blood or green matter (bile) in your vomit.  You have blood in your stool or your stool looks black and tarry.  You have not urinated in 6 to 8 hours, or you have only urinated a small amount of very dark urine.  You have a fever.  You faint. MAKE SURE YOU:   Understand these instructions.  Will watch your condition.  Will get help right away if you are not doing well or get worse. Document Released: 04/25/2005 Document Revised: 07/18/2011 Document Reviewed: 12/13/2010 [employer] Patient Information 2014 [employer].  

## 2012-10-24 LAB — CBC WITH DIFFERENTIAL/PLATELET
Basophils Relative: 0.3 % (ref 0.0–3.0)
Eosinophils Absolute: 0.1 10*3/uL (ref 0.0–0.7)
Eosinophils Relative: 1.3 % (ref 0.0–5.0)
HCT: 38 % (ref 36.0–46.0)
Hemoglobin: 12.8 g/dL (ref 12.0–15.0)
Lymphs Abs: 1.9 10*3/uL (ref 0.7–4.0)
MCHC: 33.7 g/dL (ref 30.0–36.0)
MCV: 88.3 fl (ref 78.0–100.0)
Monocytes Absolute: 0.4 10*3/uL (ref 0.1–1.0)
Neutro Abs: 8.7 10*3/uL — ABNORMAL HIGH (ref 1.4–7.7)
RBC: 4.3 Mil/uL (ref 3.87–5.11)
WBC: 11.2 10*3/uL — ABNORMAL HIGH (ref 4.5–10.5)

## 2012-10-24 LAB — HEPATIC FUNCTION PANEL
ALT: 19 U/L (ref 0–35)
Albumin: 4.5 g/dL (ref 3.5–5.2)
Bilirubin, Direct: 0.1 mg/dL (ref 0.0–0.3)
Total Protein: 7.9 g/dL (ref 6.0–8.3)

## 2012-10-24 LAB — BASIC METABOLIC PANEL
CO2: 18 mEq/L — ABNORMAL LOW (ref 19–32)
Chloride: 106 mEq/L (ref 96–112)
Potassium: 3.7 mEq/L (ref 3.5–5.1)

## 2012-10-24 LAB — TSH: TSH: 0.82 u[IU]/mL (ref 0.35–5.50)

## 2012-10-24 LAB — VITAMIN B12: Vitamin B-12: 515 pg/mL (ref 211–911)

## 2012-11-08 ENCOUNTER — Other Ambulatory Visit: Payer: Self-pay | Admitting: Family Medicine

## 2012-11-12 NOTE — Telephone Encounter (Signed)
Last seen 10/23/12 and filled 10/16/12 #60. Please advise      KP

## 2012-12-09 ENCOUNTER — Other Ambulatory Visit: Payer: Self-pay | Admitting: Family Medicine

## 2012-12-10 NOTE — Telephone Encounter (Signed)
Ok for #60 if controlled substance agreement and UDS

## 2012-12-10 NOTE — Telephone Encounter (Signed)
Last seen 10/23/12 and filled 11/08/12 #60. No UDS or contract on file.      KP

## 2012-12-10 NOTE — Telephone Encounter (Signed)
Patient aware and agreed to come in and sign contract.       KP

## 2012-12-17 ENCOUNTER — Telehealth: Payer: Self-pay | Admitting: Family Medicine

## 2012-12-17 NOTE — Telephone Encounter (Signed)
As long as patients do not have Medicare they can have tetanus

## 2012-12-17 NOTE — Telephone Encounter (Signed)
Please advise if it is ok to have the Tdap.       KP

## 2012-12-17 NOTE — Telephone Encounter (Signed)
Please advise on MyChart request for Tdap.  Appointment Request From: Norma Kelley  With Provider: Loreen Freud, DO [-Primary Care Physician-]  Preferred Date Range: From 12/17/2012 To 12/28/2012  Preferred Times: Tuesday Morning, Thursday Morning, Friday Morning, Tuesday Afternoon, Thursday Afternoon, Friday Afternoon  Reason: To address the following health maintenance concerns. Tetanus/Tdap

## 2012-12-20 ENCOUNTER — Ambulatory Visit (INDEPENDENT_AMBULATORY_CARE_PROVIDER_SITE_OTHER): Payer: BC Managed Care – PPO

## 2012-12-20 DIAGNOSIS — Z23 Encounter for immunization: Secondary | ICD-10-CM

## 2013-01-02 ENCOUNTER — Other Ambulatory Visit: Payer: Self-pay | Admitting: Family Medicine

## 2013-01-02 NOTE — Telephone Encounter (Signed)
Last seen 10/23/12 and Rx was removed from med list 07/16/12 because was not taking the medication anymore. Please advise     KP

## 2013-01-08 ENCOUNTER — Other Ambulatory Visit: Payer: Self-pay | Admitting: Family Medicine

## 2013-01-08 NOTE — Telephone Encounter (Signed)
Alprazolam refill request.   Last OV 10-23-2012 Med last filled 12-09-2012 #60 with 0 refills.

## 2013-01-08 NOTE — Telephone Encounter (Signed)
Med phoned in °

## 2013-01-10 ENCOUNTER — Encounter: Payer: Self-pay | Admitting: Family Medicine

## 2013-01-10 ENCOUNTER — Ambulatory Visit (INDEPENDENT_AMBULATORY_CARE_PROVIDER_SITE_OTHER): Payer: BC Managed Care – PPO | Admitting: Family Medicine

## 2013-01-10 VITALS — BP 130/70 | HR 69 | Temp 99.3°F | Wt 190.0 lb

## 2013-01-10 DIAGNOSIS — F418 Other specified anxiety disorders: Secondary | ICD-10-CM

## 2013-01-10 DIAGNOSIS — F341 Dysthymic disorder: Secondary | ICD-10-CM

## 2013-01-10 MED ORDER — ESCITALOPRAM OXALATE 10 MG PO TABS: 10.0000 mg | ORAL_TABLET | Freq: Every day | ORAL | Status: DC

## 2013-01-10 NOTE — Progress Notes (Signed)
  Subjective:     Norma Kelley is a 52 y.o. female who presents for follow up of anxiety, depression. She has the following anxiety symptoms: difficulty concentrating and panic attacks. Onset of symptoms was approximately a few weeks ago. Symptoms have been gradually worsening since that time. She denies current suicidal and homicidal ideation. Family history significant for no psychiatric illness. Risk factors: failing health of her mom and last child off to college. Previous treatment includes Lexapro and Xanax. She complains of the following medication side effects: none. The following portions of the patient's history were reviewed and updated as appropriate: allergies, current medications, past family history, past medical history, past social history, past surgical history and problem list.  Review of Systems Pertinent items are noted in HPI.    Objective:    BP 130/70  Pulse 69  Temp(Src) 99.3 F (37.4 C) (Oral)  Wt 190 lb (86.183 kg)  BMI 30.68 kg/m2  SpO2 98% General appearance: alert, cooperative, appears stated age and no distress Neck: no adenopathy, supple, symmetrical, trachea midline and thyroid not enlarged, symmetric, no tenderness/mass/nodules Lungs: clear to auscultation bilaterally Heart: S1, S2 normal Psych-- normal affect, calm today       Assessment:    depression with anxiety and panic. Possible organic contributing causes are: none.   Plan:    Medications: Lexapro and Xanax. Recommended counseling. List of counselors provided. Handouts describing disease, natural history, and treatment were given to the patient. Instructed patient to contact office or on-call physician promptly should condition worsen or any new symptoms appear and provided on-call telephone numbers. IF THE PATIENT HAS ANY SUICIDAL OR HOMICIDAL IDEATIONS, CALL THE OFFICE, DISCUSS WITH A SUPPORT MEMBER, OR GO TO THE ER IMMEDIATELY. Patient was agreeable with this plan. Follow up: 1 month.

## 2013-01-10 NOTE — Patient Instructions (Signed)
Anxiety and Panic Attacks Your caregiver has informed you that you are having an anxiety or panic attack. There may be many forms of this. Most of the time these attacks come suddenly and without warning. They come at any time of day, including periods of sleep, and at any time of life. They may be strong and unexplained. Although panic attacks are very scary, they are physically harmless. Sometimes the cause of your anxiety is not known. Anxiety is a protective mechanism of the body in its fight or flight mechanism. Most of these perceived danger situations are actually nonphysical situations (such as anxiety over losing a job). CAUSES  The causes of an anxiety or panic attack are many. Panic attacks may occur in otherwise healthy people given a certain set of circumstances. There may be a genetic cause for panic attacks. Some medications may also have anxiety as a side effect. SYMPTOMS  Some of the most common feelings are:  Intense terror.  Dizziness, feeling faint.  Hot and cold flashes.  Fear of going crazy.  Feelings that nothing is real.  Sweating.  Shaking.  Chest pain or a fast heartbeat (palpitations).  Smothering, choking sensations.  Feelings of impending doom and that death is near.  Tingling of extremities, this may be from over-breathing.  Altered reality (derealization).  Being detached from yourself (depersonalization). Several symptoms can be present to make up anxiety or panic attacks. DIAGNOSIS  The evaluation by your caregiver will depend on the type of symptoms you are experiencing. The diagnosis of anxiety or panic attack is made when no physical illness can be determined to be a cause of the symptoms. TREATMENT  Treatment to prevent anxiety and panic attacks may include:  Avoidance of circumstances that cause anxiety.  Reassurance and relaxation.  Regular exercise.  Relaxation therapies, such as yoga.  Psychotherapy with a psychiatrist or  therapist.  Avoidance of caffeine, alcohol and illegal drugs.  Prescribed medication. SEEK IMMEDIATE MEDICAL CARE IF:   You experience panic attack symptoms that are different than your usual symptoms.  You have any worsening or concerning symptoms. Document Released: 04/25/2005 Document Revised: 07/18/2011 Document Reviewed: 08/27/2009 [facility] Patient Information 2014 ExitCare, [place].   lexapro 10 mg   1/2 tab a day for 8 days then 1 a day

## 2013-01-11 ENCOUNTER — Encounter: Payer: Self-pay | Admitting: Family Medicine

## 2013-01-11 DIAGNOSIS — F418 Other specified anxiety disorders: Secondary | ICD-10-CM

## 2013-01-14 ENCOUNTER — Telehealth: Payer: Self-pay | Admitting: General Practice

## 2013-01-14 NOTE — Telephone Encounter (Signed)
Spoke with pt regarding her lingering symptoms. Pt states that she is feeling light-headed, loss of balance, and her "head just doesn't feel right". States it is from the Lexapro.

## 2013-01-14 NOTE — Telephone Encounter (Signed)
She can stop lexapro --- then symptoms should go away within in about 2 weeks If it is not lexapro and symptoms con't ---ov

## 2013-01-14 NOTE — Telephone Encounter (Signed)
Pt states that she does not want to be on the Lexapro the anyways. Will call if symptoms worsen.

## 2013-01-15 MED ORDER — SERTRALINE HCL 50 MG PO TABS: ORAL_TABLET | ORAL | Status: DC

## 2013-01-16 ENCOUNTER — Encounter: Payer: Self-pay | Admitting: Family Medicine

## 2013-01-23 ENCOUNTER — Encounter: Payer: Self-pay | Admitting: Family Medicine

## 2013-02-04 ENCOUNTER — Other Ambulatory Visit: Payer: Self-pay | Admitting: Family Medicine

## 2013-02-04 NOTE — Telephone Encounter (Signed)
Last visit 01/10/13  Last filled 01/08/13  Uds- 12/12/12-low risk, contract on file  Please advise. SW

## 2013-03-03 ENCOUNTER — Other Ambulatory Visit: Payer: Self-pay | Admitting: Family Medicine

## 2013-03-04 NOTE — Telephone Encounter (Signed)
Last seen 01/10/13 and filled 02/04/13 #60. Please advise     KP

## 2013-03-14 ENCOUNTER — Other Ambulatory Visit: Payer: Self-pay

## 2013-04-01 ENCOUNTER — Ambulatory Visit (INDEPENDENT_AMBULATORY_CARE_PROVIDER_SITE_OTHER): Payer: BC Managed Care – PPO | Admitting: Family Medicine

## 2013-04-01 ENCOUNTER — Encounter: Payer: Self-pay | Admitting: Family Medicine

## 2013-04-01 VITALS — BP 128/72 | HR 65 | Temp 98.5°F | Ht 66.0 in | Wt 191.2 lb

## 2013-04-01 DIAGNOSIS — R079 Chest pain, unspecified: Secondary | ICD-10-CM

## 2013-04-01 DIAGNOSIS — G4700 Insomnia, unspecified: Secondary | ICD-10-CM

## 2013-04-01 DIAGNOSIS — F411 Generalized anxiety disorder: Secondary | ICD-10-CM

## 2013-04-01 DIAGNOSIS — Z8249 Family history of ischemic heart disease and other diseases of the circulatory system: Secondary | ICD-10-CM

## 2013-04-01 MED ORDER — TRAZODONE HCL 50 MG PO TABS: 25.0000 mg | ORAL_TABLET | Freq: Every evening | ORAL | Status: DC | PRN

## 2013-04-01 NOTE — Assessment & Plan Note (Signed)
con't meds Pt reluctant to try anything different Consider counseling / psych

## 2013-04-01 NOTE — Progress Notes (Signed)
Pre visit review using our clinic review tool, if applicable. No additional management support is needed unless otherwise documented below in the visit note. 

## 2013-04-01 NOTE — Progress Notes (Signed)
  Subjective:    Norma Kelley is a 52 y.o. female who presents for evaluation of chest pain. Onset was several days ago. Symptoms have been stable since that time. The patient describes the pain as pressure and radiates to the left arm. . Associated symptoms are: chest pressure/discomfort. Aggravating factors are: emotional stress. Alleviating factors are: rest. Patient's cardiac risk factors are: family history of premature cardiovascular disease and sedentary lifestyle. Patient's risk factors for DVT/PE: none. Previous cardiac testing: electrocardiogram (ECG).  The following portions of the patient's history were reviewed and updated as appropriate:  She  has a past medical history of Anxiety; Breast cancer; Depression; and Thyroid disease. She  does not have any pertinent problems on file. She  has past surgical history that includes Abdominal hysterectomy and Tonsillectomy. Her family history includes Coronary artery disease in an other family member; Diabetes in her mother; Heart disease in her father; Osteoporosis in an other family member. She  reports that she has never smoked. She has never used smokeless tobacco. She reports that she does not drink alcohol or use illicit drugs. She has a current medication list which includes the following prescription(s): alprazolam, aspirin, levothyroxine, mometasone, omeprazole, valacyclovir, and trazodone. Current Outpatient Prescriptions on File Prior to Visit  Medication Sig Dispense Refill  . ALPRAZolam (XANAX) 0.25 MG tablet TAKE 1 TABLET BY MOUTH THREE TIMES DAILY AS NEEDED  60 tablet  0  . aspirin 81 MG tablet Take 81 mg by mouth daily.      Marland Kitchen levothyroxine (SYNTHROID, LEVOTHROID) 125 MCG tablet TAKE 1 TABLET BY MOUTH EVERY DAY  30 tablet  11  . mometasone (ELOCON) 0.1 % cream APPLY TO AFFECTED AREA DAILY  45 g  1  . Omeprazole ([facility] OMEPRAZOLE) 20 MG TBEC Take 1 tablet by mouth.       . valACYclovir (VALTREX) 500 MG tablet TAKE 1 TABLET BY  MOUTH ONCE A DAY AS NEEDED  30 tablet  5   No current facility-administered medications on file prior to visit.   She is allergic to sulfamethoxazole and sulfonamide derivatives..  Review of Systems Pertinent items are noted in HPI.    Objective:    BP 128/72  Pulse 65  Temp(Src) 98.5 F (36.9 C) (Oral)  Ht 5\' 6"  (1.676 m)  Wt 191 lb 3.2 oz (86.728 kg)  BMI 30.88 kg/m2  SpO2 98% General appearance: alert, cooperative, appears stated age and no distress Head: Normocephalic, without obvious abnormality, atraumatic Throat: lips, mucosa, and tongue normal; teeth and gums normal Neck: no adenopathy, no carotid bruit, no JVD, supple, symmetrical, trachea midline and thyroid not enlarged, symmetric, no tenderness/mass/nodules Lungs: clear to auscultation bilaterally Heart: S1, S2 normal Extremities: extremities normal, atraumatic, no cyanosis or edema  Cardiographics ECG: normal sinus rhythm, no blocks or conduction defects, no ischemic changes  Imaging Chest x-ray: not indicated    Assessment:    Chest pain, suspected etiology: stress, anxiety    Plan:    pt worried because of family hx---requesting cardiology referral  If pt occurs again-- call office or go to ER

## 2013-04-01 NOTE — Patient Instructions (Signed)

## 2013-04-01 NOTE — Assessment & Plan Note (Signed)
trazadone 50 mg  1//2-1 po qhs prn

## 2013-04-02 ENCOUNTER — Ambulatory Visit (INDEPENDENT_AMBULATORY_CARE_PROVIDER_SITE_OTHER): Payer: BC Managed Care – PPO | Admitting: Cardiology

## 2013-04-02 ENCOUNTER — Encounter: Payer: Self-pay | Admitting: Cardiology

## 2013-04-02 VITALS — BP 148/90 | HR 92 | Ht 66.0 in | Wt 189.9 lb

## 2013-04-02 DIAGNOSIS — R079 Chest pain, unspecified: Secondary | ICD-10-CM

## 2013-04-02 DIAGNOSIS — R072 Precordial pain: Secondary | ICD-10-CM

## 2013-04-02 NOTE — Progress Notes (Signed)
HPI The patient presents for evaluation of chest discomfort. She had a history of palpitations years ago and reports a negative stress test. This seemed to improve with decreased caffeine. However, for a while now she has been having some left upper chest discomfort. This happens sporadically. It seems to happen at rest and she cannot bring it on with activities. She describes it as sometimes a prickly discomfort and occasionally squeezing. It might be 5 out 10 in intensity. There may be some mild diaphoresis with it but no reproducible nausea or vomiting. She doesn't describe jaw or arm pain. She doesn't have shortness of breath, PND or orthopnea. She's not describing palpitations, presyncope or syncope. She doesn't exercise routinely though she does do some walking.  Allergies  Allergen Reactions  . Sulfamethoxazole     REACTION: unspecified  . Sulfonamide Derivatives     Current Outpatient Prescriptions  Medication Sig Dispense Refill  . ALPRAZolam (XANAX) 0.25 MG tablet AS NEEDED      . aspirin 81 MG tablet Take 81 mg by mouth daily.      Marland Kitchen levothyroxine (SYNTHROID, LEVOTHROID) 125 MCG tablet TAKE 1 TABLET BY MOUTH EVERY DAY  30 tablet  11  . mometasone (ELOCON) 0.1 % cream APPLY TO AFFECTED AREA AS NEEDED      . Omeprazole ([facility] OMEPRAZOLE) 20 MG TBEC Take 1 tablet by mouth.       . valACYclovir (VALTREX) 500 MG tablet TAKE 1 TABLET BY MOUTH ONCE A DAY AS NEEDED  30 tablet  5  . traZODone (DESYREL) 50 MG tablet Take 0.5-1 tablets (25-50 mg total) by mouth at bedtime as needed for sleep.  30 tablet  3   No current facility-administered medications for this visit.    Past Medical History  Diagnosis Date  . Anxiety   . Breast cancer   . Depression   . Thyroid disease     Past Surgical History  Procedure Laterality Date  . Abdominal hysterectomy    . Tonsillectomy      Family History  Problem Relation Age of Onset  . Coronary artery disease    . Osteoporosis    . Diabetes  Mother   . Heart disease Father   . Heart attack Father 67    MASSIVE MI    History   Social History  . Marital Status: Married    Spouse Name: N/A    Number of Children: N/A  . Years of Education: N/A   Occupational History  . Not on file.   Social History Main Topics  . Smoking status: Never Smoker   . Smokeless tobacco: Never Used  . Alcohol Use: No  . Drug Use: No  . Sexual Activity: Yes   Other Topics Concern  . Not on file   Social History Narrative   REGULAR EXERCISE    ROS:  Positive for reflux, joint pains in her hands. Otherwise as stated in the HPI and negative for all other systems.  PHYSICAL EXAM BP 148/90  Pulse 92  Ht 5\' 6"  (1.676 m)  Wt 189 lb 14.4 oz (86.138 kg)  BMI 30.67 kg/m2 GENERAL:  Well appearing HEENT:  Pupils equal round and reactive, fundi not visualized, oral mucosa unremarkable NECK:  No jugular venous distention, waveform within normal limits, carotid upstroke brisk and symmetric, no bruits, no thyromegaly LYMPHATICS:  No cervical, inguinal adenopathy LUNGS:  Clear to auscultation bilaterally BACK:  No CVA tenderness CHEST:  Unremarkable HEART:  PMI not displaced or  sustained,S1 and S2 within normal limits, no S3, no S4, no clicks, no rubs, no murmurs ABD:  Flat, positive bowel sounds normal in frequency in pitch, no bruits, no rebound, no guarding, no midline pulsatile mass, no hepatomegaly, no splenomegaly EXT:  2 plus pulses throughout, no edema, no cyanosis no clubbing SKIN:  No rashes no nodules NEURO:  Cranial nerves II through XII grossly intact, motor grossly intact throughout PSYCH:  Cognitively intact, oriented to person place and time  EKG:  Sinus rhythm, rate 84, axis within normal limits, intervals within normal limits, no acute ST-T wave changes.  04/02/2013   ASSESSMENT AND PLAN  CHEST PAIN:  This is atypical.  However, she has a significant family history for early onset coronary disease. Therefore, screening for  an exercise treadmill test is indicated. In addition I think we should further risk stratify with a coronary calcium score. Further evaluation and primary risk reduction will be based on this result.

## 2013-04-02 NOTE — Patient Instructions (Addendum)
The current medical regimen is effective;  continue present plan and medications.  Your physician has requested that you have an exercise tolerance test. For further information please visit [email or web address] Please also follow instruction sheet, as given.  Coronary Calcium score is a CT scan that [place] a noninvasive, special x-ray that produces cross-sectional images of the body using x-rays and a computer. CT scans help physicians diagnose and treat medical conditions. For some CT exams, a contrast material is used to enhance visibility in the area of the body being studied. CT scans provide greater clarity and reveal more details than regular x-ray exams.  This testing is done at the [address] [facility] office on the 3rd Floor.  Further follow up will be based on these results.

## 2013-04-03 ENCOUNTER — Telehealth (HOSPITAL_COMMUNITY): Payer: Self-pay | Admitting: *Deleted

## 2013-04-05 ENCOUNTER — Other Ambulatory Visit: Payer: Self-pay

## 2013-04-05 MED ORDER — ALPRAZOLAM 0.25 MG PO TABS: ORAL_TABLET | ORAL | Status: DC

## 2013-04-05 NOTE — Telephone Encounter (Signed)
Last seen on 04/01/13  And filled 03/04/13 #60. Please advise      KP

## 2013-04-10 ENCOUNTER — Other Ambulatory Visit: Payer: BC Managed Care – PPO

## 2013-04-10 ENCOUNTER — Telehealth (HOSPITAL_COMMUNITY): Payer: Self-pay | Admitting: *Deleted

## 2013-04-11 ENCOUNTER — Encounter (HOSPITAL_COMMUNITY): Payer: BC Managed Care – PPO

## 2013-04-12 ENCOUNTER — Encounter (HOSPITAL_COMMUNITY): Payer: BC Managed Care – PPO

## 2013-04-18 ENCOUNTER — Encounter (HOSPITAL_COMMUNITY): Payer: BC Managed Care – PPO

## 2013-04-18 ENCOUNTER — Ambulatory Visit (INDEPENDENT_AMBULATORY_CARE_PROVIDER_SITE_OTHER)
Admission: RE | Admit: 2013-04-18 | Discharge: 2013-04-18 | Disposition: A | Discharge status: Home / Self Care | Payer: Self-pay | Source: Ambulatory Visit | Attending: Cardiology | Admitting: Cardiology

## 2013-04-18 DIAGNOSIS — R079 Chest pain, unspecified: Secondary | ICD-10-CM

## 2013-04-23 ENCOUNTER — Encounter (HOSPITAL_COMMUNITY): Payer: BC Managed Care – PPO

## 2013-04-25 ENCOUNTER — Ambulatory Visit (HOSPITAL_COMMUNITY)
Admission: RE | Admit: 2013-04-25 | Discharge: 2013-04-25 | Disposition: A | Discharge status: Home / Self Care | Payer: BC Managed Care – PPO | Source: Ambulatory Visit | Attending: Cardiovascular Disease | Admitting: Cardiovascular Disease

## 2013-04-25 DIAGNOSIS — R079 Chest pain, unspecified: Secondary | ICD-10-CM

## 2013-04-29 ENCOUNTER — Other Ambulatory Visit: Payer: Self-pay | Admitting: Family Medicine

## 2013-04-29 NOTE — Telephone Encounter (Signed)
Last seen 04/01/13 and filled 04/05/13 #60. Please advise      KP

## 2013-05-03 ENCOUNTER — Telehealth: Payer: Self-pay | Admitting: *Deleted

## 2013-05-03 NOTE — Telephone Encounter (Signed)
Notified of ETT results per Dr. Antoine Poche

## 2013-05-21 ENCOUNTER — Other Ambulatory Visit: Payer: Self-pay

## 2013-05-21 DIAGNOSIS — Z9012 Acquired absence of left breast and nipple: Secondary | ICD-10-CM

## 2013-05-21 DIAGNOSIS — Z1231 Encounter for screening mammogram for malignant neoplasm of breast: Secondary | ICD-10-CM

## 2013-05-29 ENCOUNTER — Other Ambulatory Visit: Payer: Self-pay | Admitting: Family Medicine

## 2013-05-29 NOTE — Telephone Encounter (Signed)
Last OV 04-01-13 Med filled 04-29-13 #60 with 0  Low Risk

## 2013-05-29 NOTE — Telephone Encounter (Signed)
Med filled and faxed.  

## 2013-06-13 ENCOUNTER — Ambulatory Visit
Admission: RE | Admit: 2013-06-13 | Discharge: 2013-06-13 | Disposition: A | Discharge status: Home / Self Care | Payer: BC Managed Care – PPO | Source: Ambulatory Visit

## 2013-06-13 DIAGNOSIS — Z1231 Encounter for screening mammogram for malignant neoplasm of breast: Secondary | ICD-10-CM

## 2013-06-13 DIAGNOSIS — Z9012 Acquired absence of left breast and nipple: Secondary | ICD-10-CM

## 2013-06-27 ENCOUNTER — Other Ambulatory Visit: Payer: Self-pay | Admitting: Family Medicine

## 2013-06-27 NOTE — Telephone Encounter (Signed)
Med filled and faxed.  

## 2013-06-27 NOTE — Telephone Encounter (Signed)
Last OV 04-01-13 Med filled 05-29-13 #60 with 0  Low Risk

## 2013-07-24 ENCOUNTER — Other Ambulatory Visit: Payer: Self-pay | Admitting: Family Medicine

## 2013-07-24 NOTE — Telephone Encounter (Signed)
Last seen 04/01/13 and filled 06/27/13 #60. Please advise     KP

## 2013-07-25 MED ORDER — ALPRAZOLAM 0.25 MG PO TABS: ORAL_TABLET | ORAL | Status: DC

## 2013-07-25 NOTE — Telephone Encounter (Signed)
Rx faxed.    KP 

## 2013-07-25 NOTE — Addendum Note (Signed)
Addended by: Ewing Schlein on: 07/25/2013 08:05 AM   Modules accepted: Orders

## 2013-08-12 ENCOUNTER — Encounter: Payer: Self-pay | Admitting: Gastroenterology

## 2013-08-21 ENCOUNTER — Other Ambulatory Visit: Payer: Self-pay | Admitting: Family Medicine

## 2013-08-21 NOTE — Telephone Encounter (Signed)
Last seen 04/01/13 and filled 07/25/13 #60 UDS 12/12/12 Low risk  Please advise     KP

## 2013-08-21 NOTE — Telephone Encounter (Addendum)
Rx. Faxed to [id number] - [place], [address] AT [place]

## 2013-08-25 IMAGING — MG MM DIGITAL SCREENING BILAT
4 series · 4 of 4 positions shown · non-contrast
Comparison: none

DG SCREEN MAMMOGRAM BILATERAL
Bilateral CC and MLO view(s) were taken.

DIGITAL SCREENING MAMMOGRAM WITH CAD:
There are scattered fibroglandular densities.  Left TRAM is negative.  No masses or malignant type 
calcifications are identified.  Compared with prior studies.
Images were processed with CAD.

[R CC]
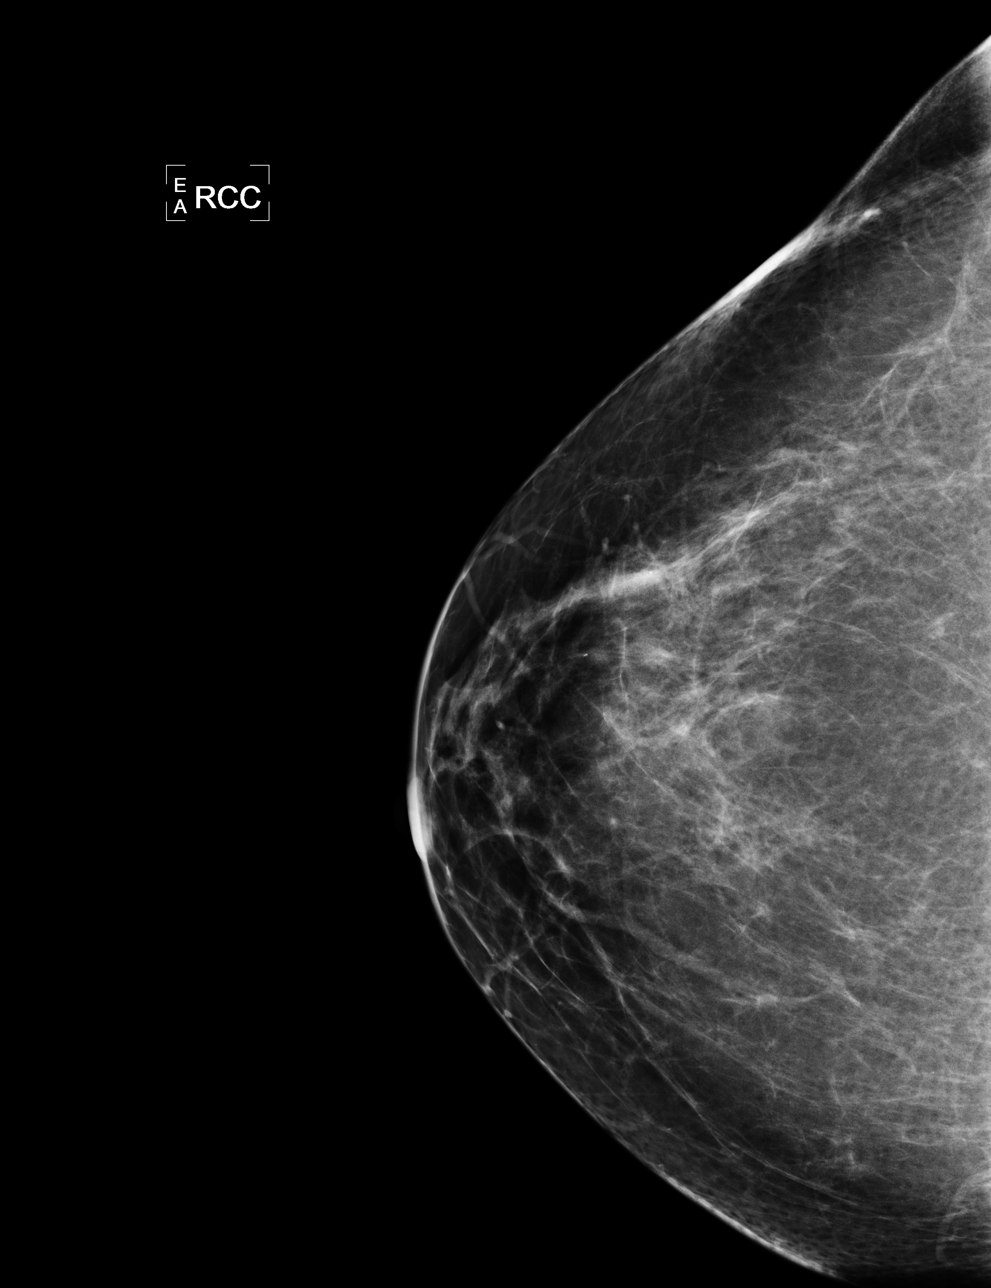

[L MLO]
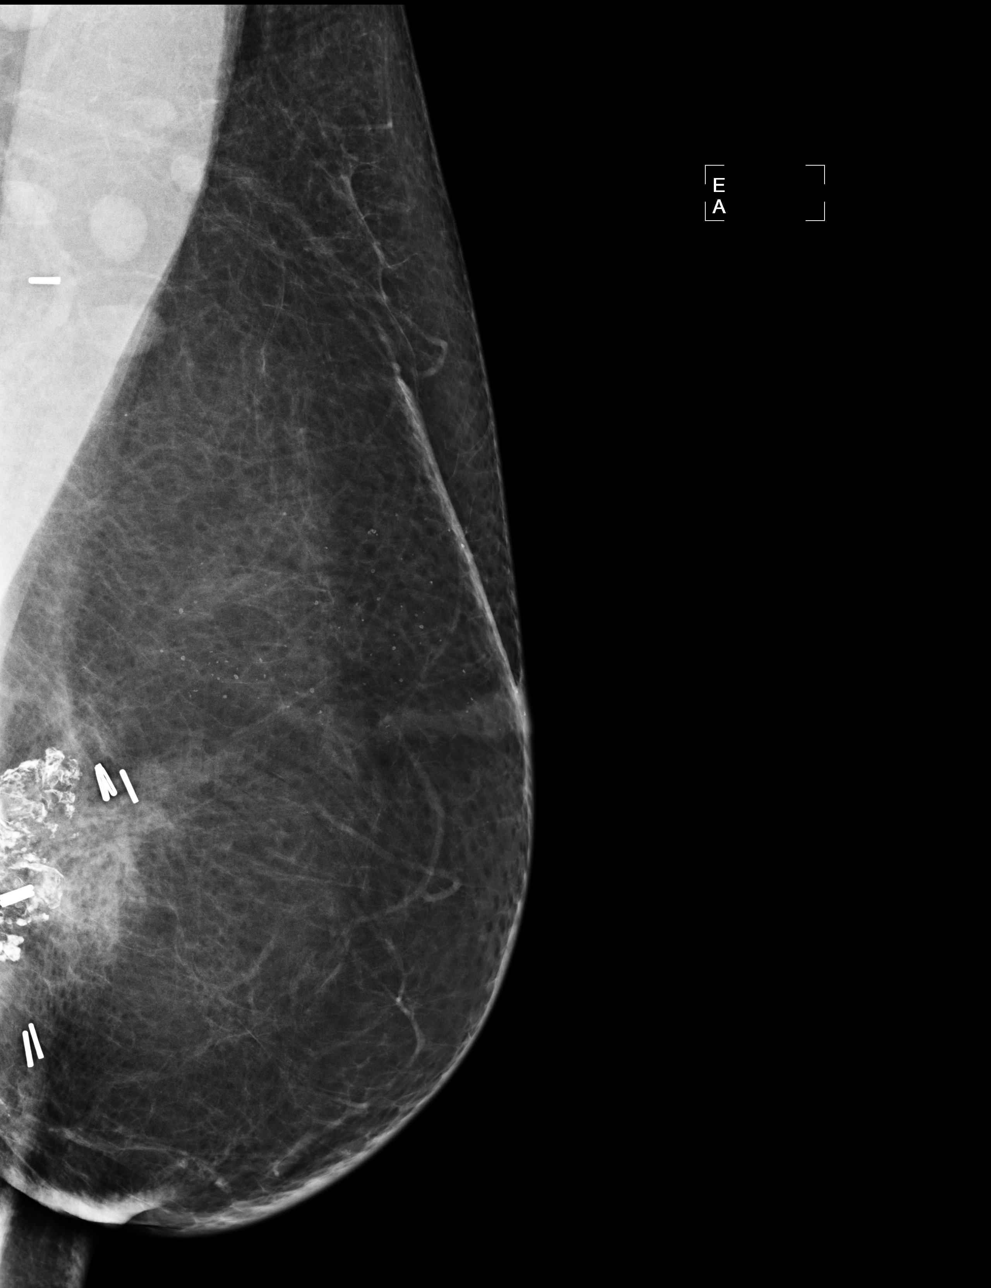

[R MLO]
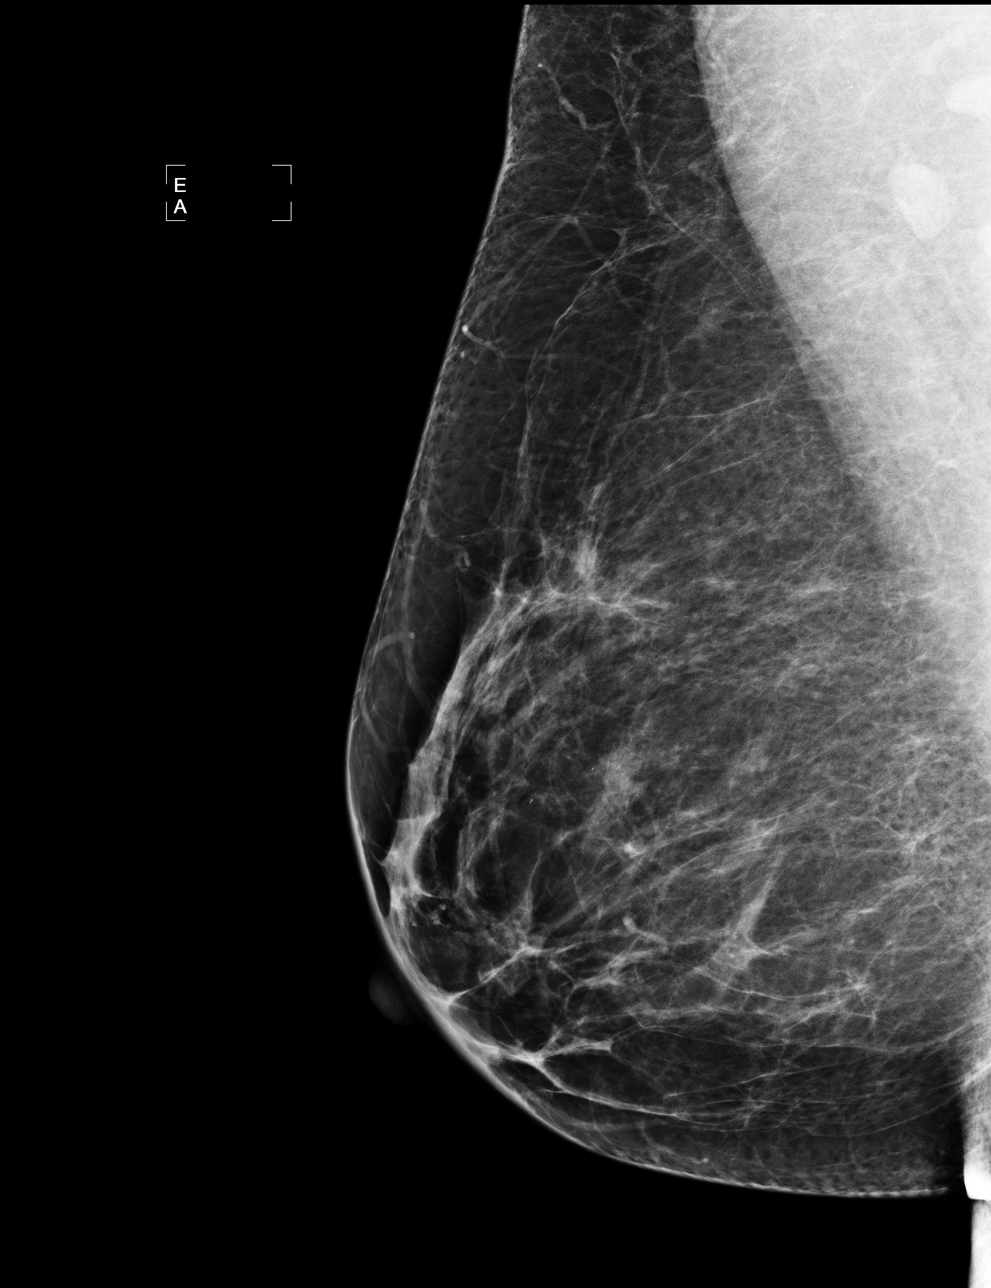

[R CV]
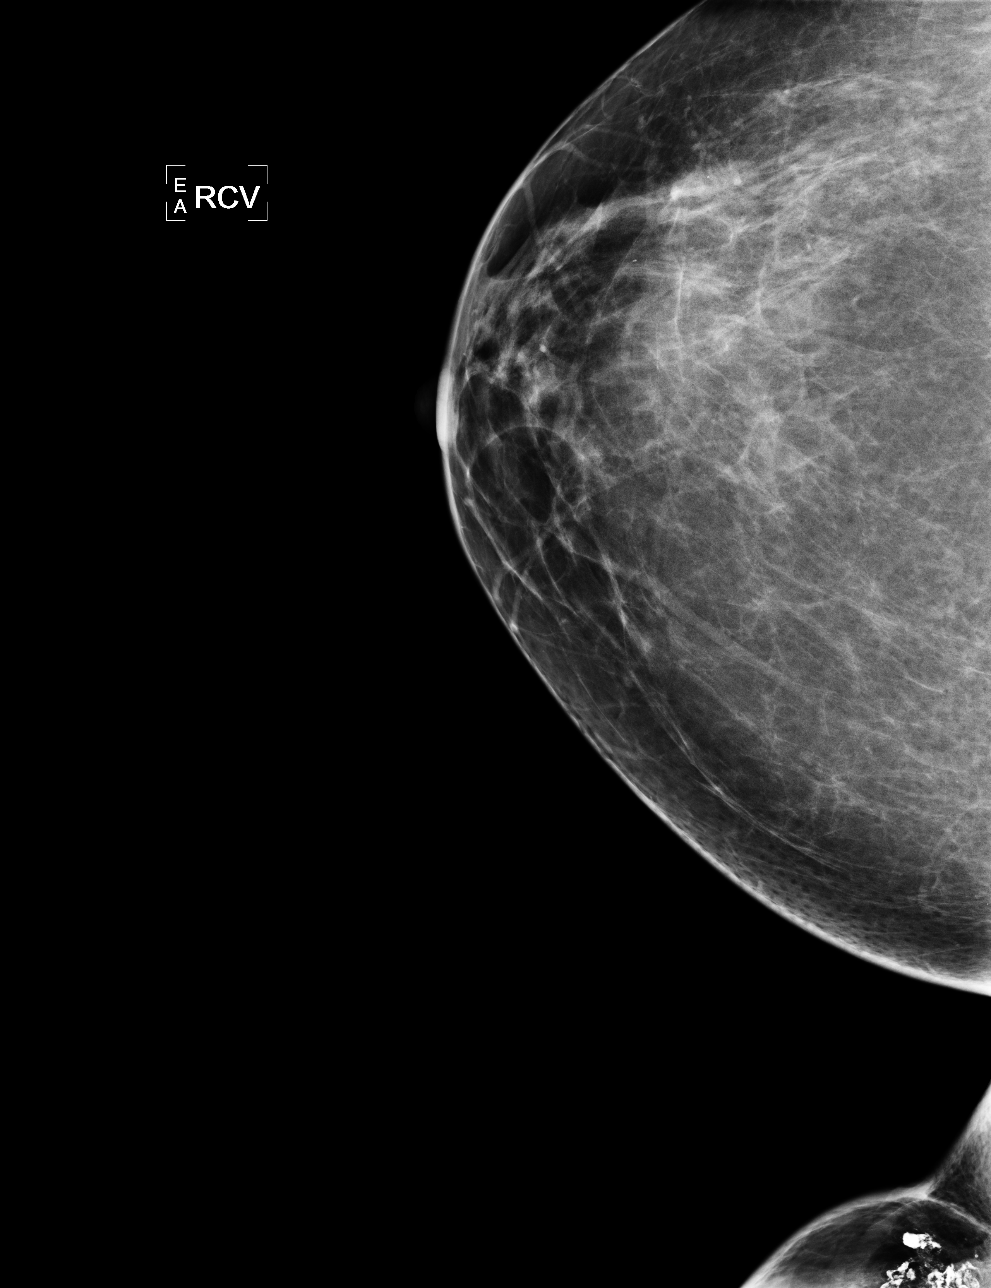

[4 of 4 positions shown; findings below may reference images not displayed]

IMPRESSION: No specific mammographic evidence of malignancy.  Next screening mammogram is recommended in one 
year.

A result letter of this screening mammogram will be mailed directly to the patient.

ASSESSMENT: Negative - BI-RADS 1

Screening mammogram in 1 year.
,

## 2013-09-17 ENCOUNTER — Other Ambulatory Visit: Payer: Self-pay | Admitting: Family Medicine

## 2013-09-17 ENCOUNTER — Encounter: Payer: Self-pay | Admitting: Gastroenterology

## 2013-09-17 NOTE — Telephone Encounter (Signed)
Last seen 04/01/13 and filled 08/21/13 #60 Please advise     KP

## 2013-10-07 ENCOUNTER — Ambulatory Visit (INDEPENDENT_AMBULATORY_CARE_PROVIDER_SITE_OTHER): Payer: BC Managed Care – PPO | Admitting: Family Medicine

## 2013-10-07 ENCOUNTER — Encounter: Payer: Self-pay | Admitting: Family Medicine

## 2013-10-07 VITALS — BP 122/72 | HR 68 | Temp 98.5°F | Wt 195.0 lb

## 2013-10-07 DIAGNOSIS — J40 Bronchitis, not specified as acute or chronic: Secondary | ICD-10-CM

## 2013-10-07 MED ORDER — GUAIFENESIN-CODEINE 100-10 MG/5ML PO SYRP: ORAL_SOLUTION | ORAL | Status: DC

## 2013-10-07 MED ORDER — AZITHROMYCIN 250 MG PO TABS: ORAL_TABLET | ORAL | Status: DC

## 2013-10-07 NOTE — Progress Notes (Signed)
  Subjective:     Norma Kelley is a 53 y.o. female here for evaluation of a cough. Onset of symptoms was 1 week ago. Symptoms have been gradually worsening since that time. The cough is productive and is aggravated by infection and reclining position. Associated symptoms include: postnasal drip, shortness of breath and sputum production. Patient does not have a history of asthma. Patient does not have a history of environmental allergens. Patient has not traveled recently. Patient does not have a history of smoking. Patient has not had a previous chest x-ray. Patient has not had a PPD done.  The following portions of the patient's history were reviewed and updated as appropriate: allergies, current medications, past family history, past medical history, past social history, past surgical history and problem list.  Review of Systems Pertinent items are noted in HPI.    Objective:    Oxygen saturation 97% on room air BP 122/72  Pulse 68  Temp(Src) 98.5 F (36.9 C) (Oral)  Wt 195 lb (88.451 kg)  SpO2 97% General appearance: alert, cooperative, appears stated age and no distress Ears: normal TM's and external ear canals both ears Nose: Nares normal. Septum midline. Mucosa normal. No drainage or sinus tenderness. Throat: abnormal findings: mild oropharyngeal erythema and pnd Neck: mild anterior cervical adenopathy, supple, symmetrical, trachea midline and thyroid not enlarged, symmetric, no tenderness/mass/nodules Lungs: diminished breath sounds bilaterally Heart: S1, S2 normal Extremities: extremities normal, atraumatic, no cyanosis or edema    Assessment:    Acute Bronchitis    Plan:    Antibiotics per medication orders. Antitussives per medication orders. Avoid exposure to tobacco smoke and fumes. Call if shortness of breath worsens, blood in sputum, change in character of cough, development of fever or chills, inability to maintain nutrition and hydration. Avoid exposure to  tobacco smoke and fumes. Trial of antihistamines.

## 2013-10-07 NOTE — Patient Instructions (Signed)

## 2013-10-07 NOTE — Progress Notes (Signed)
Pre visit review using our clinic review tool, if applicable. No additional management support is needed unless otherwise documented below in the visit note. 

## 2013-10-18 ENCOUNTER — Other Ambulatory Visit: Payer: Self-pay | Admitting: Family Medicine

## 2013-10-21 ENCOUNTER — Other Ambulatory Visit: Payer: Self-pay | Admitting: Family Medicine

## 2013-10-21 NOTE — Telephone Encounter (Signed)
Requesting refill on alprazolam Last fill 09/17/2013 #60 UDS 12/2012--Neg--low risk  Please advise

## 2013-10-21 NOTE — Telephone Encounter (Signed)
Rx faxed manually.     KP

## 2013-10-24 ENCOUNTER — Ambulatory Visit (AMBULATORY_SURGERY_CENTER): Payer: Self-pay

## 2013-10-24 VITALS — Ht 66.0 in | Wt 198.0 lb

## 2013-10-24 DIAGNOSIS — Z860100 Personal history of colon polyps, unspecified: Secondary | ICD-10-CM

## 2013-10-24 MED ORDER — SUPREP BOWEL PREP KIT 17.5-3.13-1.6 GM/177ML PO SOLN: 1.0000 | Freq: Once | ORAL | Status: DC

## 2013-10-24 NOTE — Progress Notes (Signed)
No allergies to eggs or soy No past problems with anesthesia No home oxygen No diet/weight loss meds  Has email  Emmi instructions given for colonoscopy 

## 2013-11-01 ENCOUNTER — Encounter: Payer: Self-pay | Admitting: Gastroenterology

## 2013-11-14 ENCOUNTER — Ambulatory Visit (AMBULATORY_SURGERY_CENTER): Payer: BC Managed Care – PPO | Admitting: Gastroenterology

## 2013-11-14 ENCOUNTER — Encounter: Payer: Self-pay | Admitting: Gastroenterology

## 2013-11-14 VITALS — BP 142/88 | HR 66 | Temp 96.9°F | Resp 19 | Ht 66.0 in | Wt 198.0 lb

## 2013-11-14 DIAGNOSIS — K648 Other hemorrhoids: Secondary | ICD-10-CM

## 2013-11-14 DIAGNOSIS — D126 Benign neoplasm of colon, unspecified: Secondary | ICD-10-CM

## 2013-11-14 DIAGNOSIS — K5730 Diverticulosis of large intestine without perforation or abscess without bleeding: Secondary | ICD-10-CM

## 2013-11-14 DIAGNOSIS — Z86010 Personal history of colonic polyps: Secondary | ICD-10-CM

## 2013-11-14 MED ORDER — SODIUM CHLORIDE 0.9 % IV SOLN: 500.0000 mL | INTRAVENOUS | Status: DC

## 2013-11-14 NOTE — Progress Notes (Signed)
A/ox3, pleased with MAC, report to RN 

## 2013-11-14 NOTE — Op Note (Signed)
[facility]  [address] [place], [id number]   COLONOSCOPY PROCEDURE REPORT  PATIENT: Norma, Kelley  MR#: [id number] BIRTHDATE: 01/02/1961 , 26  yrs. old GENDER: Female ENDOSCOPIST: Inda Castle, MD REFERRED BY: PROCEDURE DATE:  11/14/2013 PROCEDURE:   Colonoscopy with snare polypectomy First Screening Colonoscopy - Avg.  risk and is 50 yrs.  old or older - No.  Prior Negative Screening - Now for repeat screening. N/A  History of Adenoma - Now for follow-up colonoscopy & has been > or = to 3 yrs.  Yes hx of adenoma.  Has been 3 or more years since last colonoscopy.  Polyps Removed Today? Yes. ASA CLASS:   Class II INDICATIONS:Patient's personal history of colon polyps 2010 MEDICATIONS: MAC sedation, administered by CRNA and propofol (Diprivan) 400mg  IV  DESCRIPTION OF PROCEDURE:   After the risks benefits and alternatives of the procedure were thoroughly explained, informed consent was obtained.  A digital rectal exam revealed no abnormalities of the rectum.   The LB [id number]  endoscope was introduced through the anus and advanced to the cecum, which was identified by both the appendix and ileocecal valve. No adverse events experienced.   The quality of the prep was Suprep good  The instrument was then slowly withdrawn as the colon was fully examined.      COLON FINDINGS: A flat polyp measuring 3 mm in size was found at the cecum.  A polypectomy was performed with a cold snare.  The resection was complete and the polyp tissue was completely retrieved.   A sessile polyp measuring 3 mm in size was found in the descending colon.  A polypectomy was performed with a cold snare.  The resection was complete and the polyp tissue was completely retrieved.   Mild diverticulosis was noted in the sigmoid colon and descending colon.   Internal hemorrhoids were found.  Retroflexed views revealed no abnormalities. The time to cecum=7 minutes 50 seconds.   Withdrawal time=17 minutes 29 seconds. The scope was withdrawn and the procedure completed. COMPLICATIONS: There were no complications.  ENDOSCOPIC IMPRESSION: 1.   Flat polyp measuring 3 mm in size was found at the cecum; polypectomy was performed with a cold snare 2.   Sessile polyp measuring 3 mm in size was found in the descending colon; polypectomy was performed with a cold snare 3.   Mild diverticulosis was noted in the sigmoid colon and descending colon 4.   Internal hemorrhoids  RECOMMENDATIONS: If the polyp(s) removed today are proven to be adenomatous (pre-cancerous) polyps, you will need a repeat colonoscopy in 5 years.  Otherwise you should continue to follow colorectal cancer screening guidelines for "routine risk" patients with colonoscopy in 10 years.  You will receive a letter within 1-2 weeks with the results of your biopsy as well as final recommendations.  Please call my office if you have not received a letter after 3 weeks.   eSigned:  Inda Castle, MD 11/14/2013 10:06 AM   cc: [name], DO   PATIENT NAME:  Norma, Kelley MR#: [id number]

## 2013-11-14 NOTE — Patient Instructions (Signed)
YOU HAD AN ENDOSCOPIC PROCEDURE TODAY AT THE [facility]: Refer to the procedure report that was given to you for any specific questions about what was found during the examination.  If the procedure report does not answer your questions, please call your gastroenterologist to clarify.  If you requested that your care partner not be given the details of your procedure findings, then the procedure report has been included in a sealed envelope for you to review at your convenience later.  YOU SHOULD EXPECT: Some feelings of bloating in the abdomen. Passage of more gas than usual.  Walking can help get rid of the air that was put into your GI tract during the procedure and reduce the bloating. If you had a lower endoscopy (such as a colonoscopy or flexible sigmoidoscopy) you may notice spotting of blood in your stool or on the toilet paper. If you underwent a bowel prep for your procedure, then you may not have a normal bowel movement for a few days.  DIET: Your first meal following the procedure should be a light meal and then it is ok to progress to your normal diet.  A half-sandwich or bowl of soup is an example of a good first meal.  Heavy or fried foods are harder to digest and may make you feel nauseous or bloated.  Likewise meals heavy in dairy and vegetables can cause extra gas to form and this can also increase the bloating.  Drink plenty of fluids but you should avoid alcoholic beverages for 24 hours.  ACTIVITY: Your care partner should take you home directly after the procedure.  You should plan to take it easy, moving slowly for the rest of the day.  You can resume normal activity the day after the procedure however you should NOT DRIVE or use heavy machinery for 24 hours (because of the sedation medicines used during the test).    SYMPTOMS TO REPORT IMMEDIATELY: A gastroenterologist can be reached at any hour.  During normal business hours, 8:30 AM to 5:00 PM Monday through Friday,  call [phone].  After hours and on weekends, please call the GI answering service at [phone] who will take a message and have the physician on call contact you.   Following lower endoscopy (colonoscopy or flexible sigmoidoscopy):  Excessive amounts of blood in the stool  Significant tenderness or worsening of abdominal pains  Swelling of the abdomen that is new, acute  Fever of 100F or higher  FOLLOW UP: If any biopsies were taken you will be contacted by phone or by letter within the next 1-3 weeks.  Call your gastroenterologist if you have not heard about the biopsies in 3 weeks.  Our staff will call the home number listed on your records the next business day following your procedure to check on you and address any questions or concerns that you may have at that time regarding the information given to you following your procedure. This is a courtesy call and so if there is no answer at the home number and we have not heard from you through the emergency physician on call, we will assume that you have returned to your regular daily activities without incident.  SIGNATURES/CONFIDENTIALITY: You and/or your care partner have signed paperwork which will be entered into your electronic medical record.  These signatures attest to the fact that that the information above on your After Visit Summary has been reviewed and is understood.  Full responsibility of the confidentiality of this   discharge information lies with you and/or your care-partner.  Await pathology  Please continue your normal medications  Please read handouts about polyps, diverticulosis, hemorrhoids and high fiber diets

## 2013-11-14 NOTE — Progress Notes (Signed)
Called to room to assist during endoscopic procedure.  Patient ID and intended procedure confirmed with present staff. Received instructions for my participation in the procedure from the performing physician.  

## 2013-11-15 ENCOUNTER — Telehealth: Payer: Self-pay | Admitting: *Deleted

## 2013-11-15 NOTE — Telephone Encounter (Signed)
  Follow up Call-  Call back number 11/14/2013  Post procedure Call Back phone  # [phone]  Permission to leave phone message Yes     Patient questions:  Do you have a fever, pain , or abdominal swelling? No. Pain Score  0 *  Have you tolerated food without any problems? Yes.    Have you been able to return to your normal activities? Yes.    Do you have any questions about your discharge instructions: Diet   No. Medications  No. Follow up visit  No.  Do you have questions or concerns about your Care? No.  Actions: * If pain score is 4 or above: No action needed, pain <4.

## 2013-11-17 ENCOUNTER — Other Ambulatory Visit: Payer: Self-pay | Admitting: Family Medicine

## 2013-11-18 NOTE — Telephone Encounter (Signed)
Last seen 10/07/13 and filled 10/21/13 #60.   Please advise     KP

## 2013-11-20 ENCOUNTER — Encounter: Payer: Self-pay | Admitting: Gastroenterology

## 2013-12-04 ENCOUNTER — Other Ambulatory Visit: Payer: Self-pay | Admitting: Family Medicine

## 2013-12-13 ENCOUNTER — Other Ambulatory Visit: Payer: Self-pay | Admitting: Family Medicine

## 2013-12-13 NOTE — Telephone Encounter (Signed)
Last seen 10/07/13 and filled 11/18/13 #60.  Please advise    KP

## 2013-12-17 ENCOUNTER — Other Ambulatory Visit: Payer: Self-pay | Admitting: Family Medicine

## 2013-12-17 MED ORDER — VALACYCLOVIR HCL 500 MG PO TABS: ORAL_TABLET | ORAL | Status: DC

## 2013-12-17 NOTE — Telephone Encounter (Signed)
Scheduled CPE with Dr. Etter Sjogren on 03/13/14 @ 10:30 am.

## 2013-12-17 NOTE — Telephone Encounter (Signed)
Medication refilled until pt's appointment in November per Dr. Nonda Lou approval.

## 2013-12-17 NOTE — Telephone Encounter (Signed)
Medication refilled x 1 month.  Pt needs an office visit.  Called and left a message for call back.

## 2013-12-17 NOTE — Addendum Note (Signed)
Addended by: Rudene Anda on: 12/17/2013 04:38 PM   Modules accepted: Orders

## 2014-01-03 ENCOUNTER — Other Ambulatory Visit: Payer: Self-pay | Admitting: Family Medicine

## 2014-01-07 ENCOUNTER — Other Ambulatory Visit: Payer: Self-pay | Admitting: Family Medicine

## 2014-01-08 NOTE — Telephone Encounter (Signed)
Last seen 10/21/13 and filled 12/13/13 #60. Please advise      KP

## 2014-01-27 ENCOUNTER — Ambulatory Visit: Payer: BC Managed Care – PPO | Admitting: Family Medicine

## 2014-01-30 ENCOUNTER — Ambulatory Visit (INDEPENDENT_AMBULATORY_CARE_PROVIDER_SITE_OTHER): Payer: BC Managed Care – PPO | Admitting: Family Medicine

## 2014-01-30 ENCOUNTER — Encounter: Payer: Self-pay | Admitting: Family Medicine

## 2014-01-30 VITALS — BP 136/76 | HR 67 | Temp 98.7°F | Wt 196.0 lb

## 2014-01-30 DIAGNOSIS — R21 Rash and other nonspecific skin eruption: Secondary | ICD-10-CM

## 2014-01-30 MED ORDER — MOMETASONE FUROATE 0.1 % EX CREA: TOPICAL_CREAM | CUTANEOUS | Status: DC

## 2014-01-30 MED ORDER — METHYLPREDNISOLONE ACETATE 80 MG/ML IJ SUSP
80.0000 mg | Freq: Once | INTRAMUSCULAR | Status: AC
  Administered 2014-01-30: 80 mg via INTRAMUSCULAR

## 2014-01-30 NOTE — Addendum Note (Signed)
Addended by: Rudene Anda on: 01/30/2014 01:38 PM   Modules accepted: Orders

## 2014-01-30 NOTE — Progress Notes (Signed)
  Subjective:     Norma Kelley is a 53 y.o. female who presents for evaluation of a rash involving the trunk. Rash started several days ago. Lesions are pink, and raised in texture. Rash has changed over time. Rash is pruritic. Associated symptoms: none. Patient denies: abdominal pain, arthralgia, congestion, cough, crankiness, decrease in appetite, decrease in energy level, fever, headache, irritability, myalgia, nausea, sore throat and vomiting. Patient has not had contacts with similar rash. Patient has not had new exposures (soaps, lotions, laundry detergents, foods, medications, plants, insects or animals).she has had extreme stress.  The following portions of the patient's history were reviewed and updated as appropriate: allergies, current medications, past family history, past medical history, past social history, past surgical history and problem list.  Review of Systems Pertinent items are noted in HPI.    Objective:    BP 136/76  Pulse 67  Temp(Src) 98.7 F (37.1 C) (Oral)  Wt 195 lb 15.8 oz (88.9 kg)  SpO2 100% General:  alert, cooperative, appears stated age and no distress  Skin:  papules noted on trunk     Assessment:    dermatitis ---- ? Stress induced   Plan:    Medications: steroids: depo medrol and cream-- pt did not want pred taper. Written patient instruction given. Follow up in a few days. --prn

## 2014-01-30 NOTE — Progress Notes (Signed)
Pre visit review using our clinic review tool, if applicable. No additional management support is needed unless otherwise documented below in the visit note. 

## 2014-01-30 NOTE — Patient Instructions (Signed)

## 2014-02-03 ENCOUNTER — Encounter: Payer: Self-pay | Admitting: Family Medicine

## 2014-02-03 ENCOUNTER — Other Ambulatory Visit: Payer: Self-pay | Admitting: Family Medicine

## 2014-02-03 MED ORDER — PREDNISONE 10 MG PO TABS: ORAL_TABLET | ORAL | Status: DC

## 2014-02-03 NOTE — Telephone Encounter (Signed)
Last seen 01/30/14 and filled 01/08/14 #60 UDS 12/12/12 Low risk  Please advise    KP

## 2014-02-12 ENCOUNTER — Encounter: Payer: Self-pay | Admitting: Gastroenterology

## 2014-03-03 ENCOUNTER — Other Ambulatory Visit: Payer: Self-pay | Admitting: Family Medicine

## 2014-03-03 NOTE — Telephone Encounter (Signed)
Last seen 01/30/14 and filled 02/03/14 #60 UDS 12/12/12   Please advise    KP

## 2014-03-13 ENCOUNTER — Ambulatory Visit (INDEPENDENT_AMBULATORY_CARE_PROVIDER_SITE_OTHER): Payer: BC Managed Care – PPO | Admitting: Family Medicine

## 2014-03-13 ENCOUNTER — Other Ambulatory Visit (HOSPITAL_COMMUNITY)
Admission: RE | Admit: 2014-03-13 | Discharge: 2014-03-13 | Disposition: A | Discharge status: Home / Self Care | Payer: BC Managed Care – PPO | Source: Ambulatory Visit | Attending: Family Medicine | Admitting: Family Medicine

## 2014-03-13 ENCOUNTER — Encounter: Payer: Self-pay | Admitting: Family Medicine

## 2014-03-13 VITALS — BP 120/78 | HR 65 | Temp 98.1°F | Ht 67.0 in | Wt 197.8 lb

## 2014-03-13 DIAGNOSIS — Z853 Personal history of malignant neoplasm of breast: Secondary | ICD-10-CM

## 2014-03-13 DIAGNOSIS — Z01419 Encounter for gynecological examination (general) (routine) without abnormal findings: Secondary | ICD-10-CM | POA: Insufficient documentation

## 2014-03-13 DIAGNOSIS — E038 Other specified hypothyroidism: Secondary | ICD-10-CM

## 2014-03-13 DIAGNOSIS — Z1151 Encounter for screening for human papillomavirus (HPV): Secondary | ICD-10-CM | POA: Insufficient documentation

## 2014-03-13 DIAGNOSIS — Z0000 Encounter for general adult medical examination without abnormal findings: Secondary | ICD-10-CM

## 2014-03-13 NOTE — Progress Notes (Signed)
Subjective:     Norma Kelley is a 53 y.o. female and is here for a comprehensive physical exam. The patient reports problems - dry itchy places on her back and abd,  she is also c/o ridges in finger nails  .  History   Social History  . Marital Status: Married    Spouse Name: N/A    Number of Children: 3  . Years of Education: N/A   Occupational History  .  [employer]   Social History Main Topics  . Smoking status: Former Smoker -- 1.00 packs/day for 20 years    Types: Cigarettes  . Smokeless tobacco: Never Used     Comment: Quit 14 years ago  . Alcohol Use: No  . Drug Use: No  . Sexual Activity: Yes   Other Topics Concern  . Not on file   Social History Narrative   Lives with husband.    Health Maintenance  Topic Date Due  . INFLUENZA VACCINE  12/08/2014  . MAMMOGRAM  06/14/2015  . COLONOSCOPY  11/15/2018  . TETANUS/TDAP  12/21/2022    The following portions of the patient's history were reviewed and updated as appropriate:  She  has a past medical history of Anxiety; Breast cancer; Depression; and Thyroid disease. She  does not have any pertinent problems on file. She  has past surgical history that includes Abdominal hysterectomy; Tonsillectomy; and Breast surgery. Her family history includes Colon cancer in her maternal grandfather; Diabetes in her mother; Heart attack (age of onset: 26) in her father; Heart attack (age of onset: 55) in her mother; Hyperlipidemia in her mother; Osteoporosis in her mother. There is no history of Pancreatic cancer, Stomach cancer, or Rectal cancer. She  reports that she has quit smoking. Her smoking use included Cigarettes. She has a 20 pack-year smoking history. She has never used smokeless tobacco. She reports that she does not drink alcohol or use illicit drugs. She has a current medication list which includes the following prescription(s): alprazolam, aspirin, levothyroxine, mometasone, omeprazole, and valacyclovir. Current Outpatient  Prescriptions on File Prior to Visit  Medication Sig Dispense Refill  . ALPRAZolam (XANAX) 0.25 MG tablet TAKE 1 TABLET BY MOUTH THREE TIMES DAILY AS NEEDED 60 tablet 0  . aspirin 81 MG tablet Take 81 mg by mouth daily.    Marland Kitchen levothyroxine (SYNTHROID, LEVOTHROID) 125 MCG tablet Take 1 tablet (125 mcg total) by mouth daily. Labs are due now for more refills 90 tablet 0  . mometasone (ELOCON) 0.1 % cream APPLY TO AFFECTED AREA AS NEEDED 45 g 3  . Omeprazole ([facility] OMEPRAZOLE) 20 MG TBEC Take 1 tablet by mouth.     . valACYclovir (VALTREX) 500 MG tablet TAKE 1 TABLET BY MOUTH DAILY AS NEEDED 30 tablet 3   No current facility-administered medications on file prior to visit.   She is allergic to sulfamethoxazole and sulfonamide derivatives..  Review of Systems Review of Systems  Constitutional: Negative for activity change, appetite change and fatigue.  HENT: Negative for hearing loss, congestion, tinnitus and ear discharge.  dentist q80m Eyes: Negative for visual disturbance (see optho q1y -- vision corrected to 20/20 with glasses).  Respiratory: Negative for cough, chest tightness and shortness of breath.   Cardiovascular: Negative for chest pain, palpitations and leg swelling.  Gastrointestinal: Negative for abdominal pain, diarrhea, constipation and abdominal distention.  Genitourinary: Negative for urgency, frequency, decreased urine volume and difficulty urinating.  Musculoskeletal: Negative for back pain, arthralgias and gait problem.  Skin: Negative  for color change, pallor and rash.  Neurological: Negative for dizziness, light-headedness, numbness and headaches.  Hematological: Negative for adenopathy. Does not bruise/bleed easily.  Psychiatric/Behavioral: Negative for suicidal ideas, confusion, sleep disturbance, self-injury, dysphoric mood, decreased concentration and agitation.       Objective:    BP 120/78 mmHg  Pulse 65  Temp(Src) 98.1 F (36.7 C) (Oral)  Ht 5\' 7"  (1.702 m)   Wt 197 lb 12.8 oz (89.721 kg)  BMI 30.97 kg/m2  SpO2 96% General appearance: alert, cooperative, appears stated age and no distress Head: Normocephalic, without obvious abnormality, atraumatic Eyes: conjunctivae/corneas clear. PERRL, EOM's intact. Fundi benign. Ears: normal TM's and external ear canals both ears Nose: Nares normal. Septum midline. Mucosa normal. No drainage or sinus tenderness. Throat: lips, mucosa, and tongue normal; teeth and gums normal Neck: no adenopathy, no carotid bruit, no JVD, supple, symmetrical, trachea midline and thyroid not enlarged, symmetric, no tenderness/mass/nodules Back: symmetric, no curvature. ROM normal. No CVA tenderness. Lungs: clear to auscultation bilaterally Breasts: normal appearance, no masses or tenderness Heart: regular rate and rhythm, S1, S2 normal, no murmur, click, rub or gallop Abdomen: soft, non-tender; bowel sounds normal; no masses,  no organomegaly Pelvic: external genitalia normal, no adnexal masses or tenderness, rectovaginal septum normal, uterus surgically absent, vagina normal without discharge and vaginal smear done Extremities: extremities normal, atraumatic, no cyanosis or edema Pulses: 2+ and symmetric Skin: Skin color, texture, turgor normal. No rashes or lesions Lymph nodes: Cervical, supraclavicular, and axillary nodes normal. Neurologic: Alert and oriented X 3, normal strength and tone. Normal symmetric reflexes. Normal coordination and gait Psych- no depression, no anxiety      Assessment:    Healthy female exam.      Plan:    ghm utd Check labs See After Visit Summary for Counseling Recommendations    1. Preventative health care  - Basic metabolic panel; Future - CBC with Differential; Future - TSH; Future - POCT urinalysis dipstick; Future - Lipid panel; Future - Hepatic function panel; Future - Cytology - PAP  2. Other specified hypothyroidism Check  labs - TSH; Future

## 2014-03-13 NOTE — Progress Notes (Signed)
Pre visit review using our clinic review tool, if applicable. No additional management support is needed unless otherwise documented below in the visit note. 

## 2014-03-13 NOTE — Patient Instructions (Signed)
Preventive Care for Adults A healthy lifestyle and preventive care can promote health and wellness. Preventive health guidelines for women include the following key practices.  A routine yearly physical is a good way to check with your health care provider about your health and preventive screening. It is a chance to share any concerns and updates on your health and to receive a thorough exam.  Visit your dentist for a routine exam and preventive care every 6 months. Brush your teeth twice a day and floss once a day. Good oral hygiene prevents tooth decay and gum disease.  The frequency of eye exams is based on your age, health, family medical history, use of contact lenses, and other factors. Follow your health care provider's recommendations for frequency of eye exams.  Eat a healthy diet. Foods like vegetables, fruits, whole grains, low-fat dairy products, and lean protein foods contain the nutrients you need without too many calories. Decrease your intake of foods high in solid fats, added sugars, and salt. Eat the right amount of calories for you.Get information about a proper diet from your health care provider, if necessary.  Regular physical exercise is one of the most important things you can do for your health. Most adults should get at least 150 minutes of moderate-intensity exercise (any activity that increases your heart rate and causes you to sweat) each week. In addition, most adults need muscle-strengthening exercises on 2 or more days a week.  Maintain a healthy weight. The body mass index (BMI) is a screening tool to identify possible weight problems. It provides an estimate of body fat based on height and weight. Your health care provider can find your BMI and can help you achieve or maintain a healthy weight.For adults 20 years and older:  A BMI below 18.5 is considered underweight.  A BMI of 18.5 to 24.9 is normal.  A BMI of 25 to 29.9 is considered overweight.  A BMI of  30 and above is considered obese.  Maintain normal blood lipids and cholesterol levels by exercising and minimizing your intake of saturated fat. Eat a balanced diet with plenty of fruit and vegetables. Blood tests for lipids and cholesterol should begin at age 76 and be repeated every 5 years. If your lipid or cholesterol levels are high, you are over 50, or you are at high risk for heart disease, you may need your cholesterol levels checked more frequently.Ongoing high lipid and cholesterol levels should be treated with medicines if diet and exercise are not working.  If you smoke, find out from your health care provider how to quit. If you do not use tobacco, do not start.  Lung cancer screening is recommended for adults aged 22-80 years who are at high risk for developing lung cancer because of a history of smoking. A yearly low-dose CT scan of the lungs is recommended for people who have at least a 30-pack-year history of smoking and are a current smoker or have quit within the past 15 years. A pack year of smoking is smoking an average of 1 pack of cigarettes a day for 1 year (for example: 1 pack a day for 30 years or 2 packs a day for 15 years). Yearly screening should continue until the smoker has stopped smoking for at least 15 years. Yearly screening should be stopped for people who develop a health problem that would prevent them from having lung cancer treatment.  If you are pregnant, do not drink alcohol. If you are breastfeeding,  be very cautious about drinking alcohol. If you are not pregnant and choose to drink alcohol, do not have more than 1 drink per day. One drink is considered to be 12 ounces (355 mL) of beer, 5 ounces (148 mL) of wine, or 1.5 ounces (44 mL) of liquor.  Avoid use of street drugs. Do not share needles with anyone. Ask for help if you need support or instructions about stopping the use of drugs.  High blood pressure causes heart disease and increases the risk of  stroke. Your blood pressure should be checked at least every 1 to 2 years. Ongoing high blood pressure should be treated with medicines if weight loss and exercise do not work.  If you are 75-52 years old, ask your health care provider if you should take aspirin to prevent strokes.  Diabetes screening involves taking a blood sample to check your fasting blood sugar level. This should be done once every 3 years, after age 15, if you are within normal weight and without risk factors for diabetes. Testing should be considered at a younger age or be carried out more frequently if you are overweight and have at least 1 risk factor for diabetes.  Breast cancer screening is essential preventive care for women. You should practice "breast self-awareness." This means understanding the normal appearance and feel of your breasts and may include breast self-examination. Any changes detected, no matter how small, should be reported to a health care provider. Women in their 58s and 30s should have a clinical breast exam (CBE) by a health care provider as part of a regular health exam every 1 to 3 years. After age 16, women should have a CBE every year. Starting at age 53, women should consider having a mammogram (breast X-ray test) every year. Women who have a family history of breast cancer should talk to their health care provider about genetic screening. Women at a high risk of breast cancer should talk to their health care providers about having an MRI and a mammogram every year.  Breast cancer gene (BRCA)-related cancer risk assessment is recommended for women who have family members with BRCA-related cancers. BRCA-related cancers include breast, ovarian, tubal, and peritoneal cancers. Having family members with these cancers may be associated with an increased risk for harmful changes (mutations) in the breast cancer genes BRCA1 and BRCA2. Results of the assessment will determine the need for genetic counseling and  BRCA1 and BRCA2 testing.  Routine pelvic exams to screen for cancer are no longer recommended for nonpregnant women who are considered low risk for cancer of the pelvic organs (ovaries, uterus, and vagina) and who do not have symptoms. Ask your health care provider if a screening pelvic exam is right for you.  If you have had past treatment for cervical cancer or a condition that could lead to cancer, you need Pap tests and screening for cancer for at least 20 years after your treatment. If Pap tests have been discontinued, your risk factors (such as having a new sexual partner) need to be reassessed to determine if screening should be resumed. Some women have medical problems that increase the chance of getting cervical cancer. In these cases, your health care provider may recommend more frequent screening and Pap tests.  The HPV test is an additional test that may be used for cervical cancer screening. The HPV test looks for the virus that can cause the cell changes on the cervix. The cells collected during the Pap test can be  tested for HPV. The HPV test could be used to screen women aged 30 years and older, and should be used in women of any age who have unclear Pap test results. After the age of 30, women should have HPV testing at the same frequency as a Pap test.  Colorectal cancer can be detected and often prevented. Most routine colorectal cancer screening begins at the age of 50 years and continues through age 75 years. However, your health care provider may recommend screening at an earlier age if you have risk factors for colon cancer. On a yearly basis, your health care provider may provide home test kits to check for hidden blood in the stool. Use of a small camera at the end of a tube, to directly examine the colon (sigmoidoscopy or colonoscopy), can detect the earliest forms of colorectal cancer. Talk to your health care provider about this at age 50, when routine screening begins. Direct  exam of the colon should be repeated every 5-10 years through age 75 years, unless early forms of pre-cancerous polyps or small growths are found.  People who are at an increased risk for hepatitis B should be screened for this virus. You are considered at high risk for hepatitis B if:  You were born in a country where hepatitis B occurs often. Talk with your health care provider about which countries are considered high risk.  Your parents were born in a high-risk country and you have not received a shot to protect against hepatitis B (hepatitis B vaccine).  You have HIV or AIDS.  You use needles to inject street drugs.  You live with, or have sex with, someone who has hepatitis B.  You get hemodialysis treatment.  You take certain medicines for conditions like cancer, organ transplantation, and autoimmune conditions.  Hepatitis C blood testing is recommended for all people born from 1945 through 1965 and any individual with known risks for hepatitis C.  Practice safe sex. Use condoms and avoid high-risk sexual practices to reduce the spread of sexually transmitted infections (STIs). STIs include gonorrhea, chlamydia, syphilis, trichomonas, herpes, HPV, and human immunodeficiency virus (HIV). Herpes, HIV, and HPV are viral illnesses that have no cure. They can result in disability, cancer, and death.  You should be screened for sexually transmitted illnesses (STIs) including gonorrhea and chlamydia if:  You are sexually active and are younger than 24 years.  You are older than 24 years and your health care provider tells you that you are at risk for this type of infection.  Your sexual activity has changed since you were last screened and you are at an increased risk for chlamydia or gonorrhea. Ask your health care provider if you are at risk.  If you are at risk of being infected with HIV, it is recommended that you take a prescription medicine daily to prevent HIV infection. This is  called preexposure prophylaxis (PrEP). You are considered at risk if:  You are a heterosexual woman, are sexually active, and are at increased risk for HIV infection.  You take drugs by injection.  You are sexually active with a partner who has HIV.  Talk with your health care provider about whether you are at high risk of being infected with HIV. If you choose to begin PrEP, you should first be tested for HIV. You should then be tested every 3 months for as long as you are taking PrEP.  Osteoporosis is a disease in which the bones lose minerals and strength   with aging. This can result in serious bone fractures or breaks. The risk of osteoporosis can be identified using a bone density scan. Women ages 65 years and over and women at risk for fractures or osteoporosis should discuss screening with their health care providers. Ask your health care provider whether you should take a calcium supplement or vitamin D to reduce the rate of osteoporosis.  Menopause can be associated with physical symptoms and risks. Hormone replacement therapy is available to decrease symptoms and risks. You should talk to your health care provider about whether hormone replacement therapy is right for you.  Use sunscreen. Apply sunscreen liberally and repeatedly throughout the day. You should seek shade when your shadow is shorter than you. Protect yourself by wearing long sleeves, pants, a wide-brimmed hat, and sunglasses year round, whenever you are outdoors.  Once a month, do a whole body skin exam, using a mirror to look at the skin on your back. Tell your health care provider of new moles, moles that have irregular borders, moles that are larger than a pencil eraser, or moles that have changed in shape or color.  Stay current with required vaccines (immunizations).  Influenza vaccine. All adults should be immunized every year.  Tetanus, diphtheria, and acellular pertussis (Td, Tdap) vaccine. Pregnant women should  receive 1 dose of Tdap vaccine during each pregnancy. The dose should be obtained regardless of the length of time since the last dose. Immunization is preferred during the 27th-36th week of gestation. An adult who has not previously received Tdap or who does not know her vaccine status should receive 1 dose of Tdap. This initial dose should be followed by tetanus and diphtheria toxoids (Td) booster doses every 10 years. Adults with an unknown or incomplete history of completing a 3-dose immunization series with Td-containing vaccines should begin or complete a primary immunization series including a Tdap dose. Adults should receive a Td booster every 10 years.  Varicella vaccine. An adult without evidence of immunity to varicella should receive 2 doses or a second dose if she has previously received 1 dose. Pregnant females who do not have evidence of immunity should receive the first dose after pregnancy. This first dose should be obtained before leaving the health care facility. The second dose should be obtained 4-8 weeks after the first dose.  Human papillomavirus (HPV) vaccine. Females aged 13-26 years who have not received the vaccine previously should obtain the 3-dose series. The vaccine is not recommended for use in pregnant females. However, pregnancy testing is not needed before receiving a dose. If a female is found to be pregnant after receiving a dose, no treatment is needed. In that case, the remaining doses should be delayed until after the pregnancy. Immunization is recommended for any person with an immunocompromised condition through the age of 26 years if she did not get any or all doses earlier. During the 3-dose series, the second dose should be obtained 4-8 weeks after the first dose. The third dose should be obtained 24 weeks after the first dose and 16 weeks after the second dose.  Zoster vaccine. One dose is recommended for adults aged 60 years or older unless certain conditions are  present.  Measles, mumps, and rubella (MMR) vaccine. Adults born before 1957 generally are considered immune to measles and mumps. Adults born in 1957 or later should have 1 or more doses of MMR vaccine unless there is a contraindication to the vaccine or there is laboratory evidence of immunity to   each of the three diseases. A routine second dose of MMR vaccine should be obtained at least 28 days after the first dose for students attending postsecondary schools, health care workers, or international travelers. People who received inactivated measles vaccine or an unknown type of measles vaccine during 1963-1967 should receive 2 doses of MMR vaccine. People who received inactivated mumps vaccine or an unknown type of mumps vaccine before 1979 and are at high risk for mumps infection should consider immunization with 2 doses of MMR vaccine. For females of childbearing age, rubella immunity should be determined. If there is no evidence of immunity, females who are not pregnant should be vaccinated. If there is no evidence of immunity, females who are pregnant should delay immunization until after pregnancy. Unvaccinated health care workers born before 1957 who lack laboratory evidence of measles, mumps, or rubella immunity or laboratory confirmation of disease should consider measles and mumps immunization with 2 doses of MMR vaccine or rubella immunization with 1 dose of MMR vaccine.  Pneumococcal 13-valent conjugate (PCV13) vaccine. When indicated, a person who is uncertain of her immunization history and has no record of immunization should receive the PCV13 vaccine. An adult aged 19 years or older who has certain medical conditions and has not been previously immunized should receive 1 dose of PCV13 vaccine. This PCV13 should be followed with a dose of pneumococcal polysaccharide (PPSV23) vaccine. The PPSV23 vaccine dose should be obtained at least 8 weeks after the dose of PCV13 vaccine. An adult aged 19  years or older who has certain medical conditions and previously received 1 or more doses of PPSV23 vaccine should receive 1 dose of PCV13. The PCV13 vaccine dose should be obtained 1 or more years after the last PPSV23 vaccine dose.  Pneumococcal polysaccharide (PPSV23) vaccine. When PCV13 is also indicated, PCV13 should be obtained first. All adults aged 65 years and older should be immunized. An adult younger than age 65 years who has certain medical conditions should be immunized. Any person who resides in a nursing home or long-term care facility should be immunized. An adult smoker should be immunized. People with an immunocompromised condition and certain other conditions should receive both PCV13 and PPSV23 vaccines. People with human immunodeficiency virus (HIV) infection should be immunized as soon as possible after diagnosis. Immunization during chemotherapy or radiation therapy should be avoided. Routine use of PPSV23 vaccine is not recommended for American Indians, [place] Natives, or people younger than 65 years unless there are medical conditions that require PPSV23 vaccine. When indicated, people who have unknown immunization and have no record of immunization should receive PPSV23 vaccine. One-time revaccination 5 years after the first dose of PPSV23 is recommended for people aged 19-64 years who have chronic kidney failure, nephrotic syndrome, asplenia, or immunocompromised conditions. People who received 1-2 doses of PPSV23 before age 65 years should receive another dose of PPSV23 vaccine at age 65 years or later if at least 5 years have passed since the previous dose. Doses of PPSV23 are not needed for people immunized with PPSV23 at or after age 65 years.  Meningococcal vaccine. Adults with asplenia or persistent complement component deficiencies should receive 2 doses of quadrivalent meningococcal conjugate (MenACWY-D) vaccine. The doses should be obtained at least 2 months apart.  Microbiologists working with certain meningococcal bacteria, military recruits, people at risk during an outbreak, and people who travel to or live in countries with a high rate of meningitis should be immunized. A first-year college student up through age   21 years who is living in a residence hall should receive a dose if she did not receive a dose on or after her 16th birthday. Adults who have certain high-risk conditions should receive one or more doses of vaccine.  Hepatitis A vaccine. Adults who wish to be protected from this disease, have certain high-risk conditions, work with hepatitis A-infected animals, work in hepatitis A research labs, or travel to or work in countries with a high rate of hepatitis A should be immunized. Adults who were previously unvaccinated and who anticipate close contact with an international adoptee during the first 60 days after arrival in the [place] from a country with a high rate of hepatitis A should be immunized.  Hepatitis B vaccine. Adults who wish to be protected from this disease, have certain high-risk conditions, may be exposed to blood or other infectious body fluids, are household contacts or sex partners of hepatitis B positive people, are clients or workers in certain care facilities, or travel to or work in countries with a high rate of hepatitis B should be immunized.  Haemophilus influenzae type b (Hib) vaccine. A previously unvaccinated person with asplenia or sickle cell disease or having a scheduled splenectomy should receive 1 dose of Hib vaccine. Regardless of previous immunization, a recipient of a hematopoietic stem cell transplant should receive a 3-dose series 6-12 months after her successful transplant. Hib vaccine is not recommended for adults with HIV infection. Preventive Services / Frequency Ages 64 to 68 years  Blood pressure check.** / Every 1 to 2 years.  Lipid and cholesterol check.** / Every 5 years beginning at age  22.  Clinical breast exam.** / Every 3 years for women in their 88s and 53s.  BRCA-related cancer risk assessment.** / For women who have family members with a BRCA-related cancer (breast, ovarian, tubal, or peritoneal cancers).  Pap test.** / Every 2 years from ages 90 through 51. Every 3 years starting at age 21 through age 56 or 3 with a history of 3 consecutive normal Pap tests.  HPV screening.** / Every 3 years from ages 24 through ages 1 to 46 with a history of 3 consecutive normal Pap tests.  Hepatitis C blood test.** / For any individual with known risks for hepatitis C.  Skin self-exam. / Monthly.  Influenza vaccine. / Every year.  Tetanus, diphtheria, and acellular pertussis (Tdap, Td) vaccine.** / Consult your health care provider. Pregnant women should receive 1 dose of Tdap vaccine during each pregnancy. 1 dose of Td every 10 years.  Varicella vaccine.** / Consult your health care provider. Pregnant females who do not have evidence of immunity should receive the first dose after pregnancy.  HPV vaccine. / 3 doses over 6 months, if 72 and younger. The vaccine is not recommended for use in pregnant females. However, pregnancy testing is not needed before receiving a dose.  Measles, mumps, rubella (MMR) vaccine.** / You need at least 1 dose of MMR if you were born in 1957 or later. You may also need a 2nd dose. For females of childbearing age, rubella immunity should be determined. If there is no evidence of immunity, females who are not pregnant should be vaccinated. If there is no evidence of immunity, females who are pregnant should delay immunization until after pregnancy.  Pneumococcal 13-valent conjugate (PCV13) vaccine.** / Consult your health care provider.  Pneumococcal polysaccharide (PPSV23) vaccine.** / 1 to 2 doses if you smoke cigarettes or if you have certain conditions.  Meningococcal vaccine.** /  1 dose if you are age 19 to 21 years and a first-year college  student living in a residence hall, or have one of several medical conditions, you need to get vaccinated against meningococcal disease. You may also need additional booster doses.  Hepatitis A vaccine.** / Consult your health care provider.  Hepatitis B vaccine.** / Consult your health care provider.  Haemophilus influenzae type b (Hib) vaccine.** / Consult your health care provider. Ages 40 to 64 years  Blood pressure check.** / Every 1 to 2 years.  Lipid and cholesterol check.** / Every 5 years beginning at age 20 years.  Lung cancer screening. / Every year if you are aged 55-80 years and have a 30-pack-year history of smoking and currently smoke or have quit within the past 15 years. Yearly screening is stopped once you have quit smoking for at least 15 years or develop a health problem that would prevent you from having lung cancer treatment.  Clinical breast exam.** / Every year after age 40 years.  BRCA-related cancer risk assessment.** / For women who have family members with a BRCA-related cancer (breast, ovarian, tubal, or peritoneal cancers).  Mammogram.** / Every year beginning at age 40 years and continuing for as long as you are in good health. Consult with your health care provider.  Pap test.** / Every 3 years starting at age 30 years through age 65 or 70 years with a history of 3 consecutive normal Pap tests.  HPV screening.** / Every 3 years from ages 30 years through ages 65 to 70 years with a history of 3 consecutive normal Pap tests.  Fecal occult blood test (FOBT) of stool. / Every year beginning at age 50 years and continuing until age 75 years. You may not need to do this test if you get a colonoscopy every 10 years.  Flexible sigmoidoscopy or colonoscopy.** / Every 5 years for a flexible sigmoidoscopy or every 10 years for a colonoscopy beginning at age 50 years and continuing until age 75 years.  Hepatitis C blood test.** / For all people born from 1945 through  1965 and any individual with known risks for hepatitis C.  Skin self-exam. / Monthly.  Influenza vaccine. / Every year.  Tetanus, diphtheria, and acellular pertussis (Tdap/Td) vaccine.** / Consult your health care provider. Pregnant women should receive 1 dose of Tdap vaccine during each pregnancy. 1 dose of Td every 10 years.  Varicella vaccine.** / Consult your health care provider. Pregnant females who do not have evidence of immunity should receive the first dose after pregnancy.  Zoster vaccine.** / 1 dose for adults aged 60 years or older.  Measles, mumps, rubella (MMR) vaccine.** / You need at least 1 dose of MMR if you were born in 1957 or later. You may also need a 2nd dose. For females of childbearing age, rubella immunity should be determined. If there is no evidence of immunity, females who are not pregnant should be vaccinated. If there is no evidence of immunity, females who are pregnant should delay immunization until after pregnancy.  Pneumococcal 13-valent conjugate (PCV13) vaccine.** / Consult your health care provider.  Pneumococcal polysaccharide (PPSV23) vaccine.** / 1 to 2 doses if you smoke cigarettes or if you have certain conditions.  Meningococcal vaccine.** / Consult your health care provider.  Hepatitis A vaccine.** / Consult your health care provider.  Hepatitis B vaccine.** / Consult your health care provider.  Haemophilus influenzae type b (Hib) vaccine.** / Consult your health care provider. Ages 65   years and over  Blood pressure check.** / Every 1 to 2 years.  Lipid and cholesterol check.** / Every 5 years beginning at age 22 years.  Lung cancer screening. / Every year if you are aged 73-80 years and have a 30-pack-year history of smoking and currently smoke or have quit within the past 15 years. Yearly screening is stopped once you have quit smoking for at least 15 years or develop a health problem that would prevent you from having lung cancer  treatment.  Clinical breast exam.** / Every year after age 4 years.  BRCA-related cancer risk assessment.** / For women who have family members with a BRCA-related cancer (breast, ovarian, tubal, or peritoneal cancers).  Mammogram.** / Every year beginning at age 40 years and continuing for as long as you are in good health. Consult with your health care provider.  Pap test.** / Every 3 years starting at age 9 years through age 34 or 91 years with 3 consecutive normal Pap tests. Testing can be stopped between 65 and 70 years with 3 consecutive normal Pap tests and no abnormal Pap or HPV tests in the past 10 years.  HPV screening.** / Every 3 years from ages 57 years through ages 64 or 45 years with a history of 3 consecutive normal Pap tests. Testing can be stopped between 65 and 70 years with 3 consecutive normal Pap tests and no abnormal Pap or HPV tests in the past 10 years.  Fecal occult blood test (FOBT) of stool. / Every year beginning at age 15 years and continuing until age 17 years. You may not need to do this test if you get a colonoscopy every 10 years.  Flexible sigmoidoscopy or colonoscopy.** / Every 5 years for a flexible sigmoidoscopy or every 10 years for a colonoscopy beginning at age 86 years and continuing until age 71 years.  Hepatitis C blood test.** / For all people born from [birth year] through 1967 and any individual with known risks for hepatitis C.  Osteoporosis screening.** / A one-time screening for women ages 83 years and over and women at risk for fractures or osteoporosis.  Skin self-exam. / Monthly.  Influenza vaccine. / Every year.  Tetanus, diphtheria, and acellular pertussis (Tdap/Td) vaccine.** / 1 dose of Td every 10 years.  Varicella vaccine.** / Consult your health care provider.  Zoster vaccine.** / 1 dose for adults aged 61 years or older.  Pneumococcal 13-valent conjugate (PCV13) vaccine.** / Consult your health care provider.  Pneumococcal  polysaccharide (PPSV23) vaccine.** / 1 dose for all adults aged 28 years and older.  Meningococcal vaccine.** / Consult your health care provider.  Hepatitis A vaccine.** / Consult your health care provider.  Hepatitis B vaccine.** / Consult your health care provider.  Haemophilus influenzae type b (Hib) vaccine.** / Consult your health care provider. ** Family history and personal history of risk and conditions may change your health care provider's recommendations. Document Released: 06/21/2001 Document Revised: 09/09/2013 Document Reviewed: 09/20/2010 [facility] Patient Information 2015 [place], [place]. This information is not intended to replace advice given to you by your health care provider. Make sure you discuss any questions you have with your health care provider.

## 2014-03-13 NOTE — Assessment & Plan Note (Signed)
Pt having regular mammograms--she may have 3D this year

## 2014-03-14 ENCOUNTER — Other Ambulatory Visit (INDEPENDENT_AMBULATORY_CARE_PROVIDER_SITE_OTHER): Payer: BC Managed Care – PPO

## 2014-03-14 DIAGNOSIS — E038 Other specified hypothyroidism: Secondary | ICD-10-CM

## 2014-03-14 DIAGNOSIS — Z0000 Encounter for general adult medical examination without abnormal findings: Secondary | ICD-10-CM

## 2014-03-14 LAB — BASIC METABOLIC PANEL
BUN: 11 mg/dL (ref 6–23)
CHLORIDE: 106 meq/L (ref 96–112)
CO2: 22 mEq/L (ref 19–32)
CREATININE: 0.8 mg/dL (ref 0.4–1.2)
Calcium: 9.6 mg/dL (ref 8.4–10.5)
GFR: 83.8 mL/min (ref >60.00)
Glucose, Bld: 94 mg/dL (ref 70–99)
Potassium: 4.6 mEq/L (ref 3.5–5.1)
Sodium: 141 mEq/L (ref 135–145)

## 2014-03-14 LAB — CBC WITH DIFFERENTIAL/PLATELET
BASOS PCT: 0.6 % (ref 0.0–3.0)
Basophils Absolute: 0.1 10*3/uL (ref 0.0–0.1)
EOS PCT: 4.8 % (ref 0.0–5.0)
Eosinophils Absolute: 0.4 10*3/uL (ref 0.0–0.7)
HEMATOCRIT: 41.7 % (ref 36.0–46.0)
Hemoglobin: 13.8 g/dL (ref 12.0–15.0)
Lymphocytes Relative: 24.4 % (ref 12.0–46.0)
Lymphs Abs: 2.1 10*3/uL (ref 0.7–4.0)
MCHC: 33 g/dL (ref 30.0–36.0)
MCV: 88.5 fl (ref 78.0–100.0)
MONO ABS: 0.4 10*3/uL (ref 0.1–1.0)
MONOS PCT: 5 % (ref 3.0–12.0)
NEUTROS PCT: 65.2 % (ref 43.0–77.0)
Neutro Abs: 5.6 10*3/uL (ref 1.4–7.7)
Platelets: 273 10*3/uL (ref 150.0–400.0)
RBC: 4.71 Mil/uL (ref 3.87–5.11)
RDW: 13.2 % (ref 11.5–15.5)
WBC: 8.6 10*3/uL (ref 4.0–10.5)

## 2014-03-14 LAB — POCT URINALYSIS DIPSTICK
BILIRUBIN UA: NEGATIVE
Blood, UA: NEGATIVE
GLUCOSE UA: NEGATIVE
KETONES UA: NEGATIVE
Leukocytes, UA: NEGATIVE
Nitrite, UA: NEGATIVE
PH UA: 6
PROTEIN UA: NEGATIVE
Spec Grav, UA: 1.015
UROBILINOGEN UA: 0.2

## 2014-03-14 LAB — HEPATIC FUNCTION PANEL
ALT: 16 U/L (ref 0–35)
AST: 17 U/L (ref 0–37)
Albumin: 3.8 g/dL (ref 3.5–5.2)
Alkaline Phosphatase: 73 U/L (ref 39–117)
BILIRUBIN DIRECT: 0 mg/dL (ref 0.0–0.3)
BILIRUBIN TOTAL: 0.5 mg/dL (ref 0.2–1.2)
Total Protein: 7.5 g/dL (ref 6.0–8.3)

## 2014-03-14 LAB — LIPID PANEL
CHOL/HDL RATIO: 3
Cholesterol: 161 mg/dL (ref 0–200)
HDL: 51.1 mg/dL (ref >39.00)
LDL CALC: 99 mg/dL (ref 0–99)
NONHDL: 109.9
Triglycerides: 56 mg/dL (ref 0.0–149.0)
VLDL: 11.2 mg/dL (ref 0.0–40.0)

## 2014-03-14 LAB — TSH: TSH: 0.13 u[IU]/mL — AB (ref 0.35–4.50)

## 2014-03-14 LAB — CYTOLOGY - PAP

## 2014-03-18 ENCOUNTER — Other Ambulatory Visit: Payer: Self-pay

## 2014-03-18 MED ORDER — LEVOTHYROXINE SODIUM 112 MCG PO TABS: 112.0000 ug | ORAL_TABLET | Freq: Every day | ORAL | Status: DC

## 2014-03-28 ENCOUNTER — Encounter: Payer: Self-pay | Admitting: Internal Medicine

## 2014-03-28 ENCOUNTER — Ambulatory Visit (INDEPENDENT_AMBULATORY_CARE_PROVIDER_SITE_OTHER): Payer: BC Managed Care – PPO | Admitting: Internal Medicine

## 2014-03-28 VITALS — BP 124/64 | HR 82 | Temp 98.7°F | Wt 193.4 lb

## 2014-03-28 DIAGNOSIS — B349 Viral infection, unspecified: Secondary | ICD-10-CM

## 2014-03-28 MED ORDER — PROMETHAZINE HCL 12.5 MG PO TABS: 12.5000 mg | ORAL_TABLET | ORAL | Status: DC | PRN

## 2014-03-28 MED ORDER — OSELTAMIVIR PHOSPHATE 75 MG PO CAPS: 75.0000 mg | ORAL_CAPSULE | Freq: Two times a day (BID) | ORAL | Status: DC

## 2014-03-28 NOTE — Progress Notes (Signed)
Pre visit review using our clinic review tool, if applicable. No additional management support is needed unless otherwise documented below in the visit note. 

## 2014-03-28 NOTE — Progress Notes (Signed)
Subjective:    Patient ID: Norma Kelley, female    DOB: 1961/02/06, 53 y.o.   MRN: [id number]  DOS:  03/28/2014 Type of visit - description : acute, here with her has been Interval history: Sudden onset of symptoms this morning: Dizziness, nausea, one episode of loose stools, vomited once. Feeling shaky and having chills other coworkers have been sick with similar illness but she has not been in close contact with them.   ROS Sinus congestion for 2 days No difficulty breathing No cough or sputum production No rash or myalgias Mild/moderate headache, thinks related to sinuses  Past Medical History  Diagnosis Date  . Anxiety   . Breast cancer   . Depression   . Thyroid disease     Past Surgical History  Procedure Laterality Date  . Abdominal hysterectomy    . Tonsillectomy    . Breast surgery      Mastectomy and reconstruction    History   Social History  . Marital Status: Married    Spouse Name: N/A    Number of Children: 3  . Years of Education: N/A   Occupational History  .  [employer]   Social History Main Topics  . Smoking status: Former Smoker -- 1.00 packs/day for 20 years    Types: Cigarettes  . Smokeless tobacco: Never Used     Comment: Quit 14 years ago  . Alcohol Use: No  . Drug Use: No  . Sexual Activity: Yes   Other Topics Concern  . Not on file   Social History Narrative   Lives with husband.         Medication List       This list is accurate as of: 03/28/14 11:59 PM.  Always use your most recent med list.               ALPRAZolam 0.25 MG tablet  Commonly known as:  XANAX  TAKE 1 TABLET BY MOUTH THREE TIMES DAILY AS NEEDED     aspirin 81 MG tablet  Take 81 mg by mouth daily.     [facility] OMEPRAZOLE 20 MG Tbec  Generic drug:  Omeprazole  Take 1 tablet by mouth.     levothyroxine 112 MCG tablet  Commonly known as:  SYNTHROID, LEVOTHROID  Take 1 tablet (112 mcg total) by mouth daily.     mometasone 0.1 % cream  Commonly  known as:  ELOCON  APPLY TO AFFECTED AREA AS NEEDED     oseltamivir 75 MG capsule  Commonly known as:  TAMIFLU  Take 1 capsule (75 mg total) by mouth 2 (two) times daily.     promethazine 12.5 MG tablet  Commonly known as:  PHENERGAN  Take 1 tablet (12.5 mg total) by mouth every 4 (four) hours as needed for nausea or vomiting.     valACYclovir 500 MG tablet  Commonly known as:  VALTREX  TAKE 1 TABLET BY MOUTH DAILY AS NEEDED           Objective:   Physical Exam BP 124/64 mmHg  Pulse 82  Temp(Src) 98.7 F (37.1 C) (Oral)  Wt 193 lb 6 oz (87.714 kg)  SpO2 98% General -- alert, well-developed, vital signs stable, she does not look toxic, some discomfort due to nausea.    HEENT-- Not pale.  R Ear-- normal L ear-- normal Throat symmetric, no redness or discharge. Face symmetric, sinuses not tender to palpation. Nose slt congested.  Lungs -- normal respiratory effort, no  intercostal retractions, no accessory muscle use, and normal breath sounds.  Heart-- normal rate, regular rhythm, no murmur.  Abdomen-- Not distended, good bowel sounds,soft, non-tender. No rebound or rigidity.   Extremities-- no pretibial edema bilaterally  Neurologic--  alert & oriented X3. Speech normal, gait appropriate for age, strength symmetric and appropriate for age.  Psych-- Cognition and judgment appear intact. Cooperative with normal attention span and concentration. No anxious or depressed appearing.        Assessment & Plan:   Viral syndrome, Sudden onset of viral syndrome symptoms, we have seen other patients today with similar symptoms, DDX is viral syndrome versus influenza. Since she just started with symptoms is a good candidate to start Tamiflu otherwise will miss the opportunity to treat her. Plan: Rest, fluids, Phenergan, start Tamiflu (cost may be an issue) Call if not improving, see instructions

## 2014-03-28 NOTE — Patient Instructions (Signed)
Rest Drink plenty of clear fluids Take Tylenol as needed if fever or chills  If  cough, take Mucinex DM twice a day as needed  Tamiflu twice a day for 5 days Call if not gradually better over the next  10 days Call anytime if the symptoms are severe, you have high fever, a rash, severe headache

## 2014-04-07 ENCOUNTER — Encounter: Payer: Self-pay | Admitting: Family Medicine

## 2014-04-07 MED ORDER — ALPRAZOLAM 0.25 MG PO TABS: 0.2500 mg | ORAL_TABLET | Freq: Three times a day (TID) | ORAL | Status: DC | PRN

## 2014-04-07 NOTE — Telephone Encounter (Signed)
Last seen 03/13/14 and filled 03/04/14 #60  Please advise    KP

## 2014-04-07 NOTE — Telephone Encounter (Signed)
Refill x1 

## 2014-05-01 ENCOUNTER — Other Ambulatory Visit: Payer: Self-pay | Admitting: Internal Medicine

## 2014-05-01 ENCOUNTER — Encounter: Payer: Self-pay | Admitting: Family Medicine

## 2014-05-01 MED ORDER — ALPRAZOLAM 0.25 MG PO TABS: 0.2500 mg | ORAL_TABLET | Freq: Three times a day (TID) | ORAL | Status: DC | PRN

## 2014-05-01 NOTE — Telephone Encounter (Signed)
Last OV 03/13/14 Alprazolam last filled 04-07-14 #60 with 0 SIG: take TID as needed for anxiety  Low Risk

## 2014-05-14 ENCOUNTER — Other Ambulatory Visit: Payer: Self-pay

## 2014-05-14 DIAGNOSIS — Z1231 Encounter for screening mammogram for malignant neoplasm of breast: Secondary | ICD-10-CM

## 2014-05-16 ENCOUNTER — Ambulatory Visit (INDEPENDENT_AMBULATORY_CARE_PROVIDER_SITE_OTHER): Payer: BC Managed Care – PPO | Admitting: Family Medicine

## 2014-05-16 ENCOUNTER — Encounter: Payer: Self-pay | Admitting: Family Medicine

## 2014-05-16 VITALS — BP 120/68 | HR 79 | Temp 99.0°F | Wt 199.2 lb

## 2014-05-16 DIAGNOSIS — E039 Hypothyroidism, unspecified: Secondary | ICD-10-CM

## 2014-05-16 DIAGNOSIS — R21 Rash and other nonspecific skin eruption: Secondary | ICD-10-CM

## 2014-05-16 LAB — THYROID PANEL WITH TSH
FREE THYROXINE INDEX: 2.7 (ref 1.4–3.8)
T3 Uptake: 31 % (ref 22–35)
T4, Total: 8.8 ug/dL (ref 4.5–12.0)
TSH: 0.496 u[IU]/mL (ref 0.350–4.500)

## 2014-05-16 NOTE — Patient Instructions (Signed)
Hypothyroidism The thyroid is a large gland located in the lower front of your neck. The thyroid gland helps control metabolism. Metabolism is how your body handles food. It controls metabolism with the hormone thyroxine. When this gland is underactive (hypothyroid), it produces too little hormone.  CAUSES These include:   Absence or destruction of thyroid tissue.  Goiter due to iodine deficiency.  Goiter due to medications.  Congenital defects (since birth).  Problems with the pituitary. This causes a lack of TSH (thyroid stimulating hormone). This hormone tells the thyroid to turn out more hormone. SYMPTOMS  Lethargy (feeling as though you have no energy)  Cold intolerance  Weight gain (in spite of normal food intake)  Dry skin  Coarse hair  Menstrual irregularity (if severe, may lead to infertility)  Slowing of thought processes Cardiac problems are also caused by insufficient amounts of thyroid hormone. Hypothyroidism in the newborn is cretinism, and is an extreme form. It is important that this form be treated adequately and immediately or it will lead rapidly to retarded physical and mental development. DIAGNOSIS  To prove hypothyroidism, your caregiver may do blood tests and ultrasound tests. Sometimes the signs are hidden. It may be necessary for your caregiver to watch this illness with blood tests either before or after diagnosis and treatment. TREATMENT  Low levels of thyroid hormone are increased by using synthetic thyroid hormone. This is a safe, effective treatment. It usually takes about four weeks to gain the full effects of the medication. After you have the full effect of the medication, it will generally take another four weeks for problems to leave. Your caregiver may start you on low doses. If you have had heart problems the dose may be gradually increased. It is generally not an emergency to get rapidly to normal. HOME CARE INSTRUCTIONS   Take your  medications as your caregiver suggests. Let your caregiver know of any medications you are taking or start taking. Your caregiver will help you with dosage schedules.  As your condition improves, your dosage needs may increase. It will be necessary to have continuing blood tests as suggested by your caregiver.  Report all suspected medication side effects to your caregiver. SEEK MEDICAL CARE IF: Seek medical care if you develop:  Sweating.  Tremulousness (tremors).  Anxiety.  Rapid weight loss.  Heat intolerance.  Emotional swings.  Diarrhea.  Weakness. SEEK IMMEDIATE MEDICAL CARE IF:  You develop chest pain, an irregular heart beat (palpitations), or a rapid heart beat. MAKE SURE YOU:   Understand these instructions.  Will watch your condition.  Will get help right away if you are not doing well or get worse. Document Released: 04/25/2005 Document Revised: 07/18/2011 Document Reviewed: 12/14/2007 [employer] Patient Information 2015 [employer]. This information is not intended to replace advice given to you by your health care provider. Make sure you discuss any questions you have with your health care provider.  

## 2014-05-16 NOTE — Progress Notes (Signed)
Pre visit review using our clinic review tool, if applicable. No additional management support is needed unless otherwise documented below in the visit note. 

## 2014-05-16 NOTE — Progress Notes (Signed)
   Subjective:    Patient ID: Norma Kelley, female    DOB: 11/16/1960, 54 y.o.   MRN: [id number]  HPI Pt her f/u thyroid.  She has no complaints.      Review of Systems  Constitutional: Negative for fever, chills, diaphoresis, activity change, appetite change, fatigue and unexpected weight change.  Respiratory: Negative for apnea, cough, choking, chest tightness, shortness of breath, wheezing and stridor.   Endocrine: Negative for cold intolerance, heat intolerance, polydipsia, polyphagia and polyuria.  Skin: Positive for rash.  Psychiatric/Behavioral: Negative for dysphoric mood, decreased concentration and agitation. The patient is not nervous/anxious.        Objective:   Physical Exam  BP 120/68 mmHg  Pulse 79  Temp(Src) 99 F (37.2 C) (Oral)  Wt 199 lb 3.2 oz (90.357 kg)  SpO2 98% General appearance: alert, cooperative, appears stated age and no distress Neck: no adenopathy, supple, symmetrical, trachea midline and thyroid not enlarged, symmetric, no tenderness/mass/nodules Lungs: clear to auscultation bilaterally Heart: S1, S2 normal Skin: papular - trunk--faded      Assessment & Plan:  1. Hypothyroidism, unspecified hypothyroidism type Recheck today-- con't synthroid - Thyroid Panel With TSH  2. Rash and nonspecific skin eruption We have tried mult creams etc with no complete resolution - Ambulatory referral to Dermatology

## 2014-05-27 ENCOUNTER — Other Ambulatory Visit: Payer: Self-pay | Admitting: Internal Medicine

## 2014-05-27 NOTE — Telephone Encounter (Signed)
Last seen 05/16/14 and filled 05/01/14 #60 UDS 05/16/14 Neg for Xanax.  Please advise     KP

## 2014-06-16 ENCOUNTER — Ambulatory Visit: Payer: BC Managed Care – PPO

## 2014-06-17 ENCOUNTER — Ambulatory Visit (INDEPENDENT_AMBULATORY_CARE_PROVIDER_SITE_OTHER): Payer: BC Managed Care – PPO | Admitting: Family Medicine

## 2014-06-17 ENCOUNTER — Encounter: Payer: Self-pay | Admitting: Family Medicine

## 2014-06-17 VITALS — BP 122/78 | HR 71 | Temp 98.5°F | Resp 18 | Wt 202.2 lb

## 2014-06-17 DIAGNOSIS — G4700 Insomnia, unspecified: Secondary | ICD-10-CM

## 2014-06-17 MED ORDER — ZOLPIDEM TARTRATE 5 MG PO TABS: 5.0000 mg | ORAL_TABLET | Freq: Every evening | ORAL | Status: DC | PRN

## 2014-06-17 MED ORDER — SUVOREXANT 10 MG PO TABS: 1.0000 | ORAL_TABLET | Freq: Every evening | ORAL | Status: DC | PRN

## 2014-06-17 NOTE — Progress Notes (Signed)
   Subjective:    Patient ID: Norma Kelley, female    DOB: 1960-10-21, 54 y.o.   MRN: [id number]  HPI  Patient here for anxiety f/u and insomnia.  Past Medical History  Diagnosis Date  . Anxiety   . Breast cancer   . Depression   . Thyroid disease     Review of Systems  Constitutional: Positive for fatigue. Negative for activity change, appetite change and unexpected weight change.  Respiratory: Negative for cough and shortness of breath.   Cardiovascular: Negative for chest pain and palpitations.  Neurological: Negative.   Psychiatric/Behavioral: Negative for behavioral problems and dysphoric mood. The patient is not nervous/anxious.        Objective:    Physical Exam  Constitutional: She is oriented to person, place, and time. She appears well-developed and well-nourished. No distress.  HENT:  Right Ear: External ear normal.  Left Ear: External ear normal.  Nose: Nose normal.  Mouth/Throat: Oropharynx is clear and moist.  Eyes: EOM are normal. Pupils are equal, round, and reactive to light.  Neck: Normal range of motion. Neck supple.  Cardiovascular: Normal rate, regular rhythm and normal heart sounds.   No murmur heard. Pulmonary/Chest: Effort normal and breath sounds normal. No respiratory distress. She has no wheezes. She has no rales. She exhibits no tenderness.  Neurological: She is alert and oriented to person, place, and time.  Psychiatric: She has a normal mood and affect. Her behavior is normal. Judgment and thought content normal.    BP 122/78 mmHg  Pulse 71  Temp(Src) 98.5 F (36.9 C) (Oral)  Resp 18  Wt 202 lb 4 oz (91.74 kg)  SpO2 96% Wt Readings from Last 3 Encounters:  06/17/14 202 lb 4 oz (91.74 kg)  05/16/14 199 lb 3.2 oz (90.357 kg)  03/28/14 193 lb 6 oz (87.714 kg)     Lab Results  Component Value Date   WBC 8.6 03/14/2014   HGB 13.8 03/14/2014   HCT 41.7 03/14/2014   PLT 273.0 03/14/2014   GLUCOSE 94 03/14/2014   CHOL 161  03/14/2014   TRIG 56.0 03/14/2014   HDL 51.10 03/14/2014   LDLCALC 99 03/14/2014   ALT 16 03/14/2014   AST 17 03/14/2014   NA 141 03/14/2014   K 4.6 03/14/2014   CL 106 03/14/2014   CREATININE 0.8 03/14/2014   BUN 11 03/14/2014   CO2 22 03/14/2014   TSH 0.496 05/16/2014   MICROALBUR 0.4 07/16/2012    No results found.     Assessment & Plan:   Problem List Items Addressed This Visit    Insomnia - Primary   Relevant Medications   Suvorexant (BELSOMRA) 10 MG TABS       Garnet Koyanagi, DO

## 2014-06-17 NOTE — Patient Instructions (Signed)
Insomnia Insomnia is frequent trouble falling and/or staying asleep. Insomnia can be a long term problem or a short term problem. Both are common. Insomnia can be a short term problem when the wakefulness is related to a certain stress or worry. Long term insomnia is often related to ongoing stress during waking hours and/or poor sleeping habits. Overtime, sleep deprivation itself can make the problem worse. Every little thing feels more severe because you are overtired and your ability to cope is decreased. CAUSES   Stress, anxiety, and depression.  Poor sleeping habits.  Distractions such as TV in the bedroom.  Naps close to bedtime.  Engaging in emotionally charged conversations before bed.  Technical reading before sleep.  Alcohol and other sedatives. They may make the problem worse. They can hurt normal sleep patterns and normal dream activity.  Stimulants such as caffeine for several hours prior to bedtime.  Pain syndromes and shortness of breath can cause insomnia.  Exercise late at night.  Changing time zones may cause sleeping problems (jet lag). It is sometimes helpful to have someone observe your sleeping patterns. They should look for periods of not breathing during the night (sleep apnea). They should also look to see how long those periods last. If you live alone or observers are uncertain, you can also be observed at a sleep clinic where your sleep patterns will be professionally monitored. Sleep apnea requires a checkup and treatment. Give your caregivers your medical history. Give your caregivers observations your family has made about your sleep.  SYMPTOMS   Not feeling rested in the morning.  Anxiety and restlessness at bedtime.  Difficulty falling and staying asleep. TREATMENT   Your caregiver may prescribe treatment for an underlying medical disorders. Your caregiver can give advice or help if you are using alcohol or other drugs for self-medication. Treatment  of underlying problems will usually eliminate insomnia problems.  Medications can be prescribed for short time use. They are generally not recommended for lengthy use.  Over-the-counter sleep medicines are not recommended for lengthy use. They can be habit forming.  You can promote easier sleeping by making lifestyle changes such as:  Using relaxation techniques that help with breathing and reduce muscle tension.  Exercising earlier in the day.  Changing your diet and the time of your last meal. No night time snacks.  Establish a regular time to go to bed.  Counseling can help with stressful problems and worry.  Soothing music and white noise may be helpful if there are background noises you cannot remove.  Stop tedious detailed work at least one hour before bedtime. HOME CARE INSTRUCTIONS   Keep a diary. Inform your caregiver about your progress. This includes any medication side effects. See your caregiver regularly. Take note of:  Times when you are asleep.  Times when you are awake during the night.  The quality of your sleep.  How you feel the next day. This information will help your caregiver care for you.  Get out of bed if you are still awake after 15 minutes. Read or do some quiet activity. Keep the lights down. Wait until you feel sleepy and go back to bed.  Keep regular sleeping and waking hours. Avoid naps.  Exercise regularly.  Avoid distractions at bedtime. Distractions include watching television or engaging in any intense or detailed activity like attempting to balance the household checkbook.  Develop a bedtime ritual. Keep a familiar routine of bathing, brushing your teeth, climbing into bed at the same   time each night, listening to soothing music. Routines increase the success of falling to sleep faster.  Use relaxation techniques. This can be using breathing and muscle tension release routines. It can also include visualizing peaceful scenes. You can  also help control troubling or intruding thoughts by keeping your mind occupied with boring or repetitive thoughts like the old concept of counting sheep. You can make it more creative like imagining planting one beautiful flower after another in your backyard garden.  During your day, work to eliminate stress. When this is not possible use some of the previous suggestions to help reduce the anxiety that accompanies stressful situations. MAKE SURE YOU:   Understand these instructions.  Will watch your condition.  Will get help right away if you are not doing well or get worse. Document Released: 04/22/2000 Document Revised: 07/18/2011 Document Reviewed: 05/23/2007 [employer] Patient Information 2015 [employer]. This information is not intended to replace advice given to you by your health care provider. Make sure you discuss any questions you have with your health care provider.  

## 2014-06-18 ENCOUNTER — Ambulatory Visit
Admission: RE | Admit: 2014-06-18 | Discharge: 2014-06-18 | Disposition: A | Discharge status: Home / Self Care | Payer: BC Managed Care – PPO | Source: Ambulatory Visit

## 2014-06-18 DIAGNOSIS — Z1231 Encounter for screening mammogram for malignant neoplasm of breast: Secondary | ICD-10-CM

## 2014-06-22 ENCOUNTER — Other Ambulatory Visit: Payer: Self-pay | Admitting: Family Medicine

## 2014-06-23 NOTE — Telephone Encounter (Signed)
Last seen 06/17/14 and filled 05/17/14 #60  Please advise      KP

## 2014-06-27 ENCOUNTER — Telehealth: Payer: Self-pay | Admitting: *Deleted

## 2014-06-27 NOTE — Telephone Encounter (Signed)
Prior authorization for Belsomra initiated. Awaiting determination. JG//CMA

## 2014-06-30 NOTE — Telephone Encounter (Signed)
PA approved effective 06/09/2014 through 06/29/2014. JG//CMA

## 2014-07-22 ENCOUNTER — Telehealth: Payer: Self-pay | Admitting: *Deleted

## 2014-07-22 ENCOUNTER — Other Ambulatory Visit: Payer: Self-pay | Admitting: Family Medicine

## 2014-07-22 NOTE — Telephone Encounter (Signed)
done

## 2014-07-22 NOTE — Telephone Encounter (Signed)
  Xanax 0.5mg   Last seen  06/17/14 and Last refilled- 06/23/14 # 60 / 0 rf  UDS- 05/16/14 LOW risk.    Please advise

## 2014-07-22 NOTE — Telephone Encounter (Signed)
Xanax 0.5mg   Last OV- 06/17/14 Last refilled- 06/23/14 # 60 / 0 rf  UDS- 05/16/14 LOW risk.

## 2014-08-21 ENCOUNTER — Other Ambulatory Visit: Payer: Self-pay | Admitting: Family Medicine

## 2014-08-21 NOTE — Telephone Encounter (Signed)
Last seen 06/17/14 and filled 07/22/14 #60 UDS 05/16/14 Low risk    Please advise     KP

## 2014-09-16 ENCOUNTER — Other Ambulatory Visit: Payer: Self-pay | Admitting: Family Medicine

## 2014-09-16 NOTE — Telephone Encounter (Signed)
Last seen 06/17/14 and filled 08/21/14 #60 UDS 05/16/14 low risk   Please advise     KP

## 2014-10-09 ENCOUNTER — Other Ambulatory Visit: Payer: Self-pay | Admitting: Family Medicine

## 2014-10-14 ENCOUNTER — Other Ambulatory Visit: Payer: Self-pay | Admitting: Family Medicine

## 2014-10-14 NOTE — Telephone Encounter (Signed)
Last seen 06/17/14 and filled 09/16/14 #60 UDS 05/16/14 low risk   Please advise     KP

## 2014-11-06 ENCOUNTER — Other Ambulatory Visit: Payer: Self-pay | Admitting: Family Medicine

## 2014-11-07 NOTE — Telephone Encounter (Signed)
Last seen 06/27/14 and filled 10/14/14 #60 UDS 05/16/14 low risk  Please advise    KP

## 2014-12-09 ENCOUNTER — Other Ambulatory Visit: Payer: Self-pay | Admitting: Family Medicine

## 2014-12-09 NOTE — Telephone Encounter (Signed)
Last seen 06/17/14 and filled 11/07/14 #60   Please advise     KP

## 2015-01-05 ENCOUNTER — Other Ambulatory Visit: Payer: Self-pay | Admitting: Family Medicine

## 2015-01-05 NOTE — Telephone Encounter (Signed)
Last seen 06/17/14 and filled 12/09/14 #60   Please advise    KP

## 2015-02-03 ENCOUNTER — Other Ambulatory Visit: Payer: Self-pay | Admitting: Family Medicine

## 2015-02-04 NOTE — Telephone Encounter (Signed)
Requesting:  ALPRAZolam (XANAX) 0.25 MG tablet      Contract: on file UDS: 05/26/2014--negative for Xanax Last OV: 06/17/2014 Last Refill: 01/05/2015  Please Advise

## 2015-02-06 ENCOUNTER — Encounter: Payer: Self-pay | Admitting: Family Medicine

## 2015-02-06 ENCOUNTER — Other Ambulatory Visit: Payer: Self-pay | Admitting: Family Medicine

## 2015-02-23 ENCOUNTER — Other Ambulatory Visit: Payer: Self-pay | Admitting: Family Medicine

## 2015-03-02 ENCOUNTER — Other Ambulatory Visit: Payer: Self-pay | Admitting: Family Medicine

## 2015-03-02 NOTE — Telephone Encounter (Signed)
Last seen 06/17/14 and filled 02/04/15 #60  Please advise     KP

## 2015-03-31 ENCOUNTER — Other Ambulatory Visit: Payer: Self-pay | Admitting: Family Medicine

## 2015-03-31 NOTE — Telephone Encounter (Signed)
Last seen 06/17/14 and filled 03/02/15 #60 UDS 05/16/14 low risk   Please advise     KP

## 2015-04-08 ENCOUNTER — Ambulatory Visit: Payer: BC Managed Care – PPO | Admitting: Internal Medicine

## 2015-04-09 ENCOUNTER — Other Ambulatory Visit: Payer: Self-pay | Admitting: Family Medicine

## 2015-04-09 NOTE — Telephone Encounter (Signed)
Rx sent to the pharmacy by e-script.//AB/CMA 

## 2015-04-15 ENCOUNTER — Ambulatory Visit: Payer: BC Managed Care – PPO | Admitting: Internal Medicine

## 2015-04-15 ENCOUNTER — Ambulatory Visit (HOSPITAL_BASED_OUTPATIENT_CLINIC_OR_DEPARTMENT_OTHER)
Admission: RE | Admit: 2015-04-15 | Discharge: 2015-04-15 | Disposition: A | Discharge status: Home / Self Care | Payer: BC Managed Care – PPO | Source: Ambulatory Visit | Attending: Medical | Admitting: Medical

## 2015-04-15 ENCOUNTER — Ambulatory Visit (INDEPENDENT_AMBULATORY_CARE_PROVIDER_SITE_OTHER): Payer: BC Managed Care – PPO | Admitting: Medical

## 2015-04-15 ENCOUNTER — Encounter: Payer: Self-pay | Admitting: Medical

## 2015-04-15 VITALS — BP 124/78 | HR 81 | Temp 98.4°F | Wt 201.0 lb

## 2015-04-15 DIAGNOSIS — R062 Wheezing: Secondary | ICD-10-CM

## 2015-04-15 DIAGNOSIS — J209 Acute bronchitis, unspecified: Secondary | ICD-10-CM

## 2015-04-15 DIAGNOSIS — R05 Cough: Secondary | ICD-10-CM

## 2015-04-15 DIAGNOSIS — R059 Cough, unspecified: Secondary | ICD-10-CM

## 2015-04-15 DIAGNOSIS — R0789 Other chest pain: Secondary | ICD-10-CM | POA: Diagnosis not present

## 2015-04-15 LAB — CBC WITH DIFFERENTIAL/PLATELET
Basophils Absolute: 0 10*3/uL (ref 0.0–0.1)
Basophils Relative: 0.5 % (ref 0.0–3.0)
EOS PCT: 1.9 % (ref 0.0–5.0)
Eosinophils Absolute: 0.1 10*3/uL (ref 0.0–0.7)
HCT: 40.2 % (ref 36.0–46.0)
Hemoglobin: 13.3 g/dL (ref 12.0–15.0)
Lymphocytes Relative: 26.2 % (ref 12.0–46.0)
Lymphs Abs: 1.7 10*3/uL (ref 0.7–4.0)
MCHC: 33.1 g/dL (ref 30.0–36.0)
MCV: 86.4 fl (ref 78.0–100.0)
MONOS PCT: 7.9 % (ref 3.0–12.0)
Monocytes Absolute: 0.5 10*3/uL (ref 0.1–1.0)
NEUTROS PCT: 63.5 % (ref 43.0–77.0)
Neutro Abs: 4.2 10*3/uL (ref 1.4–7.7)
Platelets: 259 10*3/uL (ref 150.0–400.0)
RBC: 4.65 Mil/uL (ref 3.87–5.11)
RDW: 13.7 % (ref 11.5–15.5)
WBC: 6.6 10*3/uL (ref 4.0–10.5)

## 2015-04-15 MED ORDER — BENZONATATE 100 MG PO CAPS: 100.0000 mg | ORAL_CAPSULE | Freq: Three times a day (TID) | ORAL | Status: DC | PRN

## 2015-04-15 MED ORDER — AZITHROMYCIN 250 MG PO TABS: ORAL_TABLET | ORAL | Status: DC

## 2015-04-15 MED ORDER — ALBUTEROL SULFATE HFA 108 (90 BASE) MCG/ACT IN AERS: 2.0000 | INHALATION_SPRAY | Freq: Four times a day (QID) | RESPIRATORY_TRACT | Status: DC | PRN

## 2015-04-15 NOTE — Patient Instructions (Signed)
Rx of azithromycin antibiotic for bronchtis vs early walking pneumonia.  For cough rx benzonatate.  For wheezing that is minimal will make albuterol inhaler.  Will get cxr and cbc  Follow up 7 days or as needed

## 2015-04-15 NOTE — Progress Notes (Signed)
Subjective:    Patient ID: Norma Kelley, female    DOB: 11-18-1960, 54 y.o.   MRN: [id number]  HPI  Pt in with feeling sick since Friday. Pt had some productive cough on Friday. On Monday was persistently productive. Pt when she coughs feels wheezy. Pt running low grade fever since Tuesday. Some chills and sweats. No sinus pressure. No ear cough. Rare occasnional sneeze..  Pt has no hx of asthma. Pt does not smoke. Pt stopped smoking 16 years ago. Pt smoked maybe 20 yrs. About pack day when she did smoke.   Mild laryngitis today.  Couple of coworkers recently with pneumonia.   Review of Systems  Constitutional: Positive for fever, chills, diaphoresis and fatigue.       Mild fatigue.  HENT: Positive for voice change. Negative for congestion, nosebleeds, sinus pressure and trouble swallowing.   Respiratory: Positive for cough and wheezing.        Today when cough she feels like has faint wheeze.  Cardiovascular: Negative for chest pain and palpitations.  Neurological: Negative for dizziness and light-headedness.  Hematological: Negative for adenopathy. Does not bruise/bleed easily.  Psychiatric/Behavioral: Negative for behavioral problems and confusion.   Past Medical History  Diagnosis Date  . Anxiety   . Breast cancer ([place])   . Depression   . Thyroid disease     Social History   Social History  . Marital Status: Married    Spouse Name: N/A  . Number of Children: 3  . Years of Education: N/A   Occupational History  .  Un[employer]   Social History Main Topics  . Smoking status: Former Smoker -- 1.00 packs/day for 20 years    Types: Cigarettes  . Smokeless tobacco: Never Used     Comment: Quit 14 years ago  . Alcohol Use: No  . Drug Use: No  . Sexual Activity: Yes   Other Topics Concern  . Not on file   Social History Narrative   Lives with husband.     Past Surgical History  Procedure Laterality Date  . Abdominal hysterectomy    . Tonsillectomy    . Breast  surgery      Mastectomy and reconstruction    Family History  Problem Relation Age of Onset  . Hyperlipidemia Mother   . Osteoporosis Mother   . Diabetes Mother   . Heart attack Mother 97  . Heart attack Father 37    MASSIVE MI,  Died age 64  . Colon cancer Maternal Grandfather   . Pancreatic cancer Neg Hx   . Stomach cancer Neg Hx   . Rectal cancer Neg Hx     Allergies  Allergen Reactions  . Sulfamethoxazole     REACTION: unspecified  . Sulfonamide Derivatives     Current Outpatient Prescriptions on File Prior to Visit  Medication Sig Dispense Refill  . ALPRAZolam (XANAX) 0.25 MG tablet TAKE 1 TABLET BY MOUTH THREE TIMES DAILY AS NEEDED 60 tablet 0  . aspirin 81 MG tablet Take 81 mg by mouth daily.    Marland Kitchen levothyroxine (SYNTHROID, LEVOTHROID) 112 MCG tablet TAKE 1 TABLET BY MOUTH EVERY DAY 90 tablet 0  . mometasone (ELOCON) 0.1 % cream APPLY TO THE AFFECTED AREA AS NEEDED 45 g 1  . Omeprazole ([facility]S OMEPRAZOLE) 20 MG TBEC Take 1 tablet by mouth.     . valACYclovir (VALTREX) 500 MG tablet TAKE 1 TABLET BY MOUTH EVERY DAY AS NEEDED 30 tablet 2   No current facility-administered  medications on file prior to visit.    BP 124/78 mmHg  Pulse 81  Temp(Src) 98.4 F (36.9 C) (Oral)  Wt 201 lb (91.173 kg)  SpO2 98%       Objective:   Physical Exam   General  Mental Status - Alert. General Appearance - Well groomed. Not in acute distress.  Skin Rashes- No Rashes.  HEENT Head- Normal. Ear Auditory Canal - Left- Normal. Right - Normal.Tympanic Membrane- Left- Normal. Right- Normal. Eye Sclera/Conjunctiva- Left- Normal. Right- Normal. Nose & Sinuses Nasal Mucosa- Left-  Not oggy or Congested. Right-  Not  boggy or Congested. Mouth & Throat Lips: Upper Lip- Normal: no dryness, cracking, pallor, cyanosis, or vesicular eruption. Lower Lip-Normal: no dryness, cracking, pallor, cyanosis or vesicular eruption. Buccal Mucosa- Bilateral- No Aphthous ulcers. Oropharynx- No  Discharge or Erythema. Tonsils: Characteristics- Bilateral- No Erythema or Congestion. Size/Enlargement- Bilateral- No enlargement. Discharge- bilateral-None.  Neck Neck- Supple. No Masses.   Chest and Lung Exam Auscultation: Breath Sounds:- even and unlabored. Very faint upper lobe rhonchi at best.  Cardiovascular Auscultation:Rythm- Regular, rate and rhythm. Murmurs & Other Heart Sounds:Ausculatation of the heart reveal- No Murmurs.  Lymphatic Head & Neck General Head & Neck Lymphatics: Bilateral: Description- No Localized lymphadenopathy.      Assessment & Plan:  Rx of azithromycin antibiotic for bronchtis vs early walking pneumonia.  For cough rx benzonatate.  For wheezing that is minimal will make albuterol inhaler.  Will get cxr and cbc  Follow up 7 days or as

## 2015-04-15 NOTE — Progress Notes (Signed)
Pre visit review using our clinic review tool, if applicable. No additional management support is needed unless otherwise documented below in the visit note. 

## 2015-04-30 ENCOUNTER — Telehealth: Payer: Self-pay

## 2015-04-30 MED ORDER — ALPRAZOLAM 0.25 MG PO TABS: 0.2500 mg | ORAL_TABLET | Freq: Three times a day (TID) | ORAL | Status: DC | PRN

## 2015-04-30 NOTE — Telephone Encounter (Signed)
Last seen 04/15/15 by Percell Miller and filled 03/31/15 #60   Please advise     KP

## 2015-04-30 NOTE — Telephone Encounter (Signed)
Refill x1 

## 2015-04-30 NOTE — Telephone Encounter (Signed)
Rx faxed.    KP 

## 2015-05-22 ENCOUNTER — Other Ambulatory Visit: Payer: Self-pay

## 2015-05-22 DIAGNOSIS — Z1231 Encounter for screening mammogram for malignant neoplasm of breast: Secondary | ICD-10-CM

## 2015-05-28 ENCOUNTER — Other Ambulatory Visit: Payer: Self-pay | Admitting: Family Medicine

## 2015-05-28 NOTE — Telephone Encounter (Signed)
Last seen 06/17/14 and filled 04/30/15 #60  Please advise    KP

## 2015-06-05 NOTE — Telephone Encounter (Signed)
flu shot in October of 2016.

## 2015-06-26 ENCOUNTER — Other Ambulatory Visit: Payer: Self-pay | Admitting: Family Medicine

## 2015-06-28 ENCOUNTER — Other Ambulatory Visit: Payer: Self-pay | Admitting: Family Medicine

## 2015-06-29 NOTE — Telephone Encounter (Signed)
Last seen 06/17/14 and filled 05/28/15 #60 Apt pending for 4/17   Please advise     KP

## 2015-06-30 ENCOUNTER — Ambulatory Visit: Payer: BC Managed Care – PPO

## 2015-07-01 ENCOUNTER — Ambulatory Visit
Admission: RE | Admit: 2015-07-01 | Discharge: 2015-07-01 | Disposition: A | Discharge status: Home / Self Care | Payer: BC Managed Care – PPO | Source: Ambulatory Visit

## 2015-07-01 DIAGNOSIS — Z1231 Encounter for screening mammogram for malignant neoplasm of breast: Secondary | ICD-10-CM

## 2015-07-03 ENCOUNTER — Other Ambulatory Visit: Payer: Self-pay | Admitting: Family Medicine

## 2015-07-03 DIAGNOSIS — R928 Other abnormal and inconclusive findings on diagnostic imaging of breast: Secondary | ICD-10-CM

## 2015-07-09 ENCOUNTER — Ambulatory Visit
Admission: RE | Admit: 2015-07-09 | Discharge: 2015-07-09 | Disposition: A | Discharge status: Home / Self Care | Payer: BC Managed Care – PPO | Source: Ambulatory Visit | Attending: Family Medicine | Admitting: Family Medicine

## 2015-07-09 ENCOUNTER — Other Ambulatory Visit: Payer: Self-pay | Admitting: Family Medicine

## 2015-07-09 DIAGNOSIS — R928 Other abnormal and inconclusive findings on diagnostic imaging of breast: Secondary | ICD-10-CM

## 2015-07-09 DIAGNOSIS — R921 Mammographic calcification found on diagnostic imaging of breast: Secondary | ICD-10-CM

## 2015-07-20 ENCOUNTER — Ambulatory Visit
Admission: RE | Admit: 2015-07-20 | Discharge: 2015-07-20 | Disposition: A | Discharge status: Home / Self Care | Payer: BC Managed Care – PPO | Source: Ambulatory Visit | Attending: Family Medicine | Admitting: Family Medicine

## 2015-07-20 DIAGNOSIS — R921 Mammographic calcification found on diagnostic imaging of breast: Secondary | ICD-10-CM

## 2015-07-22 ENCOUNTER — Ambulatory Visit (INDEPENDENT_AMBULATORY_CARE_PROVIDER_SITE_OTHER): Payer: BC Managed Care – PPO | Admitting: Medical

## 2015-07-22 ENCOUNTER — Encounter: Payer: Self-pay | Admitting: Medical

## 2015-07-22 VITALS — BP 130/84 | HR 70 | Temp 98.6°F | Ht 66.75 in | Wt 200.4 lb

## 2015-07-22 DIAGNOSIS — J309 Allergic rhinitis, unspecified: Secondary | ICD-10-CM | POA: Diagnosis not present

## 2015-07-22 DIAGNOSIS — J029 Acute pharyngitis, unspecified: Secondary | ICD-10-CM

## 2015-07-22 LAB — POCT RAPID STREP A (OFFICE): Rapid Strep A Screen: POSITIVE — AB

## 2015-07-22 MED ORDER — FLUTICASONE PROPIONATE 50 MCG/ACT NA SUSP: 2.0000 | Freq: Every day | NASAL | Status: DC

## 2015-07-22 MED ORDER — AZITHROMYCIN 250 MG PO TABS: ORAL_TABLET | ORAL | Status: DC

## 2015-07-22 NOTE — Progress Notes (Signed)
Pre visit review using our clinic review tool, if applicable. No additional management support is needed unless otherwise documented below in the visit note. 

## 2015-07-22 NOTE — Patient Instructions (Signed)
Your rapid strep test showed faint positive and clinically your throat pain very suspicious for strep. Rx azithromycin.  Also likely flare of spring allergies. Rx flonase. Stop afrin. Restart your zyrtec.  Follow up in 7 days or as needed

## 2015-07-22 NOTE — Progress Notes (Signed)
Subjective:    Patient ID: Norma Kelley, female    DOB: [birth year], 55 y.o.   MRN: [id number]  HPI  Pt has soar throat. Feels very raw to swollowing. When swallows pain up to her ears. Pt was visiting grandson last week. Pain for about 4 days.   Pt also states she has had some nasal congestion, runny nose and some sneezing.  Pt used afrin for last 2 days. She is aware not to overuse. Pt has some hx of spring allergies.     Review of Systems  Constitutional: Negative for fever, chills and fatigue.  HENT: Positive for congestion, rhinorrhea, sneezing and sore throat.   Respiratory: Negative for cough, chest tightness, shortness of breath and wheezing.   Cardiovascular: Negative for chest pain and palpitations.  Gastrointestinal: Negative for abdominal pain.  Musculoskeletal: Negative for myalgias.  Neurological: Negative for dizziness and headaches.  Hematological: Negative for adenopathy. Does not bruise/bleed easily.  Psychiatric/Behavioral: Negative for behavioral problems and confusion.     Past Medical History  Diagnosis Date  . Anxiety   . Breast cancer ([place])   . Depression   . Thyroid disease     Social History   Social History  . Marital Status: Married    Spouse Name: N/A  . Number of Children: 3  . Years of Education: N/A   Occupational History  .  Un[employer]   Social History Main Topics  . Smoking status: Former Smoker -- 1.00 packs/day for 20 years    Types: Cigarettes  . Smokeless tobacco: Never Used     Comment: Quit 14 years ago  . Alcohol Use: No  . Drug Use: No  . Sexual Activity: Yes   Other Topics Concern  . Not on file   Social History Narrative   Lives with husband.     Past Surgical History  Procedure Laterality Date  . Abdominal hysterectomy    . Tonsillectomy    . Breast surgery      Mastectomy and reconstruction    Family History  Problem Relation Age of Onset  . Hyperlipidemia Mother   . Osteoporosis Mother   . Diabetes  Mother   . Heart attack Mother 65  . Heart attack Father 85    MASSIVE MI,  Died age 24  . Colon cancer Maternal Grandfather   . Pancreatic cancer Neg Hx   . Stomach cancer Neg Hx   . Rectal cancer Neg Hx     Allergies  Allergen Reactions  . Sulfamethoxazole     REACTION: unspecified  . Sulfonamide Derivatives     Current Outpatient Prescriptions on File Prior to Visit  Medication Sig Dispense Refill  . ALPRAZolam (XANAX) 0.25 MG tablet TAKE 1 TABLET BY MOUTH THREE TIMES DAILY AS NEEDED 60 tablet 0  . aspirin 81 MG tablet Take 81 mg by mouth daily.    Marland Kitchen levothyroxine (SYNTHROID, LEVOTHROID) 112 MCG tablet TAKE 1 TABLET BY MOUTH EVERY DAY 90 tablet 0  . mometasone (ELOCON) 0.1 % cream APPLY TO THE AFFECTED AREA AS NEEDED 45 g 1  . Omeprazole ([facility]S OMEPRAZOLE) 20 MG TBEC Take 1 tablet by mouth.     . valACYclovir (VALTREX) 500 MG tablet TAKE 1 TABLET BY MOUTH EVERY DAY AS NEEDED 30 tablet 2  . albuterol (PROVENTIL HFA;VENTOLIN HFA) 108 (90 BASE) MCG/ACT inhaler Inhale 2 puffs into the lungs every 6 (six) hours as needed for wheezing or shortness of breath. (Patient not taking: Reported on 07/22/2015) 1  Inhaler 0   No current facility-administered medications on file prior to visit.    BP 130/84 mmHg  Pulse 70  Temp(Src) 98.6 F (37 C) (Oral)  Ht 5' 6.75" (1.695 m)  Wt 200 lb 6.4 oz (90.901 kg)  BMI 31.64 kg/m2  SpO2 98%       Objective:   Physical Exam  General  Mental Status - Alert. General Appearance - Well groomed. Not in acute distress.  Skin Rashes- No Rashes.  HEENT Head- Normal. Ear Auditory Canal - Left- Normal. Right - Normal.Tympanic Membrane- Left- Normal. Right- Normal. Eye Sclera/Conjunctiva- Left- Normal. Right- Normal. Nose & Sinuses Nasal Mucosa- Left-  Boggy and Congested. Right-  Boggy and  Congested.Bilateral no  maxillary and  no rontal sinus pressure. Mouth & Throat Lips: Upper Lip- Normal: no dryness, cracking, pallor, cyanosis, or  vesicular eruption. Lower Lip-Normal: no dryness, cracking, pallor, cyanosis or vesicular eruption. Buccal Mucosa- Bilateral- No Aphthous ulcers. Oropharynx- No Discharge or Erythema. Tonsils: Characteristics- Bilateral- moderate  Erythema and Congestion. Size/Enlargement- Bilateral- 1+enlargement. Discharge- bilateral-None.  Neck Neck- Supple. No Masses.   Chest and Lung Exam Auscultation: Breath Sounds:-Clear even and unlabored.  Cardiovascular Auscultation:Rythm- Regular, rate and rhythm. Murmurs & Other Heart Sounds:Ausculatation of the heart reveal- No Murmurs.  Lymphatic Head & Neck General Head & Neck Lymphatics: Bilateral: Description- No Localized lymphadenopathy.       Assessment & Plan:  Your rapid strep test showed faint positive and clinically your throat pain very suspicious for strep. Rx azithromycin.  Also likely flare of spring allergies. Rx flonase. Stop afrin. Restart your zyrtec.  Follow up in 7 days or as needed

## 2015-07-24 ENCOUNTER — Telehealth: Payer: Self-pay | Admitting: Family Medicine

## 2015-07-24 ENCOUNTER — Other Ambulatory Visit: Payer: Self-pay | Admitting: General Surgery

## 2015-07-24 DIAGNOSIS — D0501 Lobular carcinoma in situ of right breast: Secondary | ICD-10-CM

## 2015-07-27 NOTE — Telephone Encounter (Signed)
[facility] on [address] called to f/u on getting RX for Alprazolam. Pt has called them to check on refill and they state they have not received it.

## 2015-07-27 NOTE — Telephone Encounter (Signed)
Last seen 06/17/14 and filled 06/29/15 #60   Please advise    KP

## 2015-07-30 ENCOUNTER — Encounter: Payer: Self-pay | Admitting: Genetic Counselor

## 2015-07-30 ENCOUNTER — Ambulatory Visit (HOSPITAL_BASED_OUTPATIENT_CLINIC_OR_DEPARTMENT_OTHER): Payer: BC Managed Care – PPO | Admitting: Genetic Counselor

## 2015-07-30 ENCOUNTER — Other Ambulatory Visit: Payer: BC Managed Care – PPO

## 2015-07-30 DIAGNOSIS — Z315 Encounter for genetic counseling: Secondary | ICD-10-CM | POA: Diagnosis not present

## 2015-07-30 DIAGNOSIS — Z853 Personal history of malignant neoplasm of breast: Secondary | ICD-10-CM | POA: Diagnosis not present

## 2015-07-30 DIAGNOSIS — C50911 Malignant neoplasm of unspecified site of right female breast: Secondary | ICD-10-CM

## 2015-07-30 DIAGNOSIS — C50919 Malignant neoplasm of unspecified site of unspecified female breast: Secondary | ICD-10-CM | POA: Insufficient documentation

## 2015-07-30 NOTE — Progress Notes (Signed)
REFERRING PROVIDER: [name], DO [address] [place], [place] [address]   Excell Seltzer, MD  PRIMARY PROVIDER:  Garnet Koyanagi, DO  PRIMARY REASON FOR VISIT:  1. BREAST CANCER, HX OF   2. Malignant neoplasm of right female breast, unspecified site of breast ([place])      HISTORY OF PRESENT ILLNESS:   Norma Kelley, a 55 y.o. female, was seen for a [facility] cancer genetics consultation at the request of Dr. Etter Sjogren due to a personal and family history of cancer.  Norma Kelley presents to clinic today to discuss the possibility of a hereditary predisposition to cancer, genetic testing, and to further clarify her future cancer risks, as well as potential cancer risks for family members.   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 This was treated with mastectomy and tamoxifen for 6 months.  Norma Kelley states that she was unable to tolerate the medication.  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 This will most likely be treated with a lumpectomy, but surgery is pending the results of her genetic testing.   CANCER HISTORY:   No history exists.     HORMONAL RISK FACTORS:  Menarche was at age 23.  First live birth at age 75.  OCP use for approximately 10 years.  Ovaries intact: yes.  Hysterectomy: yes.  Menopausal status: postmenopausal.  HRT use: 0 years. Colonoscopy: yes; Four TA polyps. Mammogram within the last year: yes. Number of breast biopsies: 2. Up to date with pelvic exams:  yes. Any excessive radiation exposure in the past:  no  Past Medical History  Diagnosis Date  . Anxiety   . Breast cancer ([place])   . Depression   . Thyroid disease     Past Surgical History  Procedure Laterality Date  . Abdominal hysterectomy    . Tonsillectomy    . Breast surgery      Mastectomy and reconstruction    Social History   Social History  . Marital Status: Married    Spouse  Name: N/A  . Number of Children: 3  . Years of Education: N/A   Occupational History  .  Un[employer]   Social History Main Topics  . Smoking status: Former Smoker -- 1.00 packs/day for 20 years    Types: Cigarettes  . Smokeless tobacco: Never Used     Comment: Quit 14 years ago  . Alcohol Use: No  . Drug Use: No  . Sexual Activity: Yes   Other Topics Concern  . None   Social History Narrative   Lives with husband.      FAMILY HISTORY:  We obtained a detailed, 4-generation family history.  Significant diagnoses are listed below: Family History  Problem Relation Age of Onset  . Hyperlipidemia Mother   . Osteoporosis Mother   . Diabetes Mother   . Heart attack Mother 58  . Heart attack Father 84    MASSIVE MI,  Died age 51  . Colon cancer Maternal Grandfather     dx in late 60s-early 70s  . Pancreatic cancer Neg Hx   . Stomach cancer Neg Hx   . Rectal cancer Neg Hx   . Head & neck cancer Maternal Grandmother     cancer of the sinuses    The patient has three children who are cancer free.  She is an only child.  Her father died of a heart attack at age 26.  Her mother is alive at 62 and is cancer free.  Her mother has two brothers who are living and in their 63s.  One brother has a daughter who had skin cancer.  The patient's maternal grandmother had sinus cavity cancer in her 80s and her grandfather had colon cancer in his late 20s-early 70s.  The patient's father had four brothers and one sister, none who had cancer.  There is no other reported cancer history.  Patient's maternal ancestors are of possibly [place] and [place] descent, and paternal ancestors are of possible [place] descent. There is no reported Ashkenazi Jewish ancestry. There is no known consanguinity.  GENETIC COUNSELING ASSESSMENT: Norma Kelley is a 55 y.o. female with a personal and family history of cancer which is somewhat suggestive of a hereditary cancer syndrome and predisposition to cancer. We, therefore,  discussed and recommended the following at today's visit.   DISCUSSION: We discussed that about 5-10% of breast cancer is hereditary, with the majority of cases due to BRCA mutations.  Other genes that are more commonly seen with hereditary mutaitons include PALB2, CHEK2 and ATM.  We reviewed the characteristics, features and inheritance patterns of hereditary cancer syndromes. We also discussed genetic testing, including the appropriate family members to test, the process of testing, insurance coverage and turn-around-time for results. We discussed the implications of a negative, positive and/or variant of uncertain significant result. We recommended Norma Kelley pursue genetic testing for the Breast/Ovarian gene panel. The Breast/Ovarian gene panel offered by [facility] includes sequencing and rearrangement analysis for the following 20 genes:  ATM, BARD1, BRCA1, BRCA2, BRIP1, CDH1, CHEK2, EPCAM, FANCC, MLH1, MSH2, MSH6, NBN, PALB2, PMS2, PTEN, RAD51C, RAD51D, TP53, and XRCC2.     Based on Norma Kelley's personal and family history of cancer, she meets medical criteria for genetic testing. Despite that she meets criteria, she may still have an out of pocket cost. We discussed that if her out of pocket cost for testing is over $100, the laboratory will call and confirm whether she wants to proceed with testing.  If the out of pocket cost of testing is less than $100 she will be billed by the genetic testing laboratory.   Norma Kelley is worried about her daughter's risk for breast cancer based on her two diagnosis.  We performed a risk assessment for her daughter, Norma Kelley, to understand what her risk may be.  Based on the patient's personal and family history, statistical models (Tyrer Cusik)  and literature data were used to estimate her daughter's risk of developing breast cancer. These estimate her lifetime risk of developing breast cancer to be approximately 28.3%. This estimation does not take into account any  genetic testing results.  The patient's lifetime breast cancer risk is a preliminary estimate based on available information using one of several models endorsed by the [place] (ACS). The ACS recommends consideration of breast MRI screening as an adjunct to mammography for patients at high risk (defined as 20% or greater lifetime risk). A more detailed breast cancer risk assessment can be considered, if clinically indicated.   Ms. Hable daughter, Norma Kelley, has been determined to be at high risk for breast cancer.  Therefore, we recommend that annual screening with mammography and breast MRI begin at age 31, or 10 years prior to the age of breast cancer diagnosis in a relative (whichever is earlier).  Norma Kelley will talk with her daughter about this risk.  PLAN: After considering the risks, benefits, and limitations, Norma Kelley  provided informed consent to pursue genetic testing and the blood sample was sent to [facility] for analysis of the Breast/Ovarian cancer apnel. Results should be available within approximately 2-3 weeks' time, at which point they will be disclosed by telephone to Norma Kelley, as will any additional recommendations warranted by these results. Ms. Kendra will receive a summary of her genetic counseling visit and a copy of her results once available. This information will also be available in Epic. We encouraged Norma Kelley to remain in contact with cancer genetics annually so that we can continuously update the family history and inform her of any changes in cancer genetics and testing that may be of benefit for her family. Norma Kelley questions were answered to her satisfaction today. Our contact information was provided should additional questions or concerns arise.  Lastly, we encouraged Norma Kelley to remain in contact with cancer genetics annually so that we can continuously update the family history and inform her of any changes in cancer genetics and  testing that may be of benefit for this family.   Ms.  Kelley questions were answered to her satisfaction today. Our contact information was provided should additional questions or concerns arise. Thank you for the referral and allowing [place] to share in the care of your patient.   Karen P. Florene Glen, [place], [facility] Certified Genetic Counselor [email or web address] phone: [phone]  The patient was seen for a total of 60 minutes in face-to-face genetic counseling.  This patient was discussed with Drs. Magrinat, Lindi Adie and/or Burr Medico who agrees with the above.    _______________________________________________________________________ For Office Staff:  Number of people involved in session: 1 Was an Intern/ student involved with case: no

## 2015-08-11 ENCOUNTER — Telehealth: Payer: Self-pay | Admitting: Genetic Counselor

## 2015-08-11 ENCOUNTER — Encounter: Payer: Self-pay | Admitting: Genetic Counselor

## 2015-08-11 DIAGNOSIS — Z1379 Encounter for other screening for genetic and chromosomal anomalies: Secondary | ICD-10-CM | POA: Insufficient documentation

## 2015-08-11 NOTE — Telephone Encounter (Signed)
Revealed negative genetic testing on the Breast/Ovarian cancer syndrome panel.  We will let Dr. Excell Seltzer know these results.

## 2015-08-12 ENCOUNTER — Encounter (HOSPITAL_BASED_OUTPATIENT_CLINIC_OR_DEPARTMENT_OTHER): Payer: Self-pay | Admitting: *Deleted

## 2015-08-12 ENCOUNTER — Other Ambulatory Visit: Payer: Self-pay | Admitting: General Surgery

## 2015-08-12 ENCOUNTER — Ambulatory Visit: Payer: Self-pay | Admitting: Genetic Counselor

## 2015-08-12 DIAGNOSIS — Z1379 Encounter for other screening for genetic and chromosomal anomalies: Secondary | ICD-10-CM

## 2015-08-12 DIAGNOSIS — C50911 Malignant neoplasm of unspecified site of right female breast: Secondary | ICD-10-CM

## 2015-08-12 DIAGNOSIS — D0501 Lobular carcinoma in situ of right breast: Secondary | ICD-10-CM

## 2015-08-12 NOTE — Progress Notes (Signed)
HPI: Ms. Lubben was previously seen in the [facility] clinic due to a personal history of cancer and concerns regarding a hereditary predisposition to cancer. Please refer to our prior cancer genetics clinic note for more information regarding Ms. Nand's medical, social and family histories, and our assessment and recommendations, at the time. Ms. Girdler recent genetic test results were disclosed to her, as were recommendations warranted by these results. These results and recommendations are discussed in more detail below.  FAMILY HISTORY:  We obtained a detailed, 4-generation family history.  Significant diagnoses are listed below: Family History  Problem Relation Age of Onset  . Hyperlipidemia Mother   . Osteoporosis Mother   . Diabetes Mother   . Heart attack Mother 41  . Heart attack Father 39    MASSIVE MI,  Died age 39  . Colon cancer Maternal Grandfather     dx in late 60s-early 70s  . Pancreatic cancer Neg Hx   . Stomach cancer Neg Hx   . Rectal cancer Neg Hx   . Head & neck cancer Maternal Grandmother     cancer of the sinuses    The patient has three children who are cancer free. She is an only child. Her father died of a heart attack at age 39. Her mother is alive at 79 and is cancer free. Her mother has two brothers who are living and in their 81s. One brother has a daughter who had skin cancer. The patient's maternal grandmother had sinus cavity cancer in her 3s and her grandfather had colon cancer in his late 58s-early 70s. The patient's father had four brothers and one sister, none who had cancer. There is no other reported cancer history. Patient's maternal ancestors are of possibly [place] and [place] descent, and paternal ancestors are of possible [place] descent. There is no reported Ashkenazi Jewish ancestry. There is no known consanguinity.  GENETIC TEST RESULTS: At the time of Ms. Wattenbarger's visit, we recommended she pursue genetic testing of  the Breast/Ovarian cancer gene panel. The Breast/Ovarian gene panel offered by [facility] includes sequencing and rearrangement analysis for the following 20 genes:  ATM, BARD1, BRCA1, BRCA2, BRIP1, CDH1, CHEK2, EPCAM, FANCC, MLH1, MSH2, MSH6, NBN, PALB2, PMS2, PTEN, RAD51C, RAD51D, TP53, and XRCC2.   The report date is August 10, 2015.  Genetic testing was normal, and did not reveal a deleterious mutation in these genes. The test report has been scanned into EPIC and is located under the Molecular Pathology section of the Results Review tab.   We discussed with Ms. Urbanski that since the current genetic testing is not perfect, it is possible there may be a gene mutation in one of these genes that current testing cannot detect, but that chance is small. We also discussed, that it is possible that another gene that has not yet been discovered, or that we have not yet tested, is responsible for the cancer diagnoses in the family, and it is, therefore, important to remain in touch with cancer genetics in the future so that we can continue to offer Ms. Hustead the most up to date genetic testing.   CANCER SCREENING RECOMMENDATIONS: This normal result is reassuring and indicates that Ms. Nelson does not likely have an increased risk of cancer due to a mutation in one of these genes.  We, therefore, recommended  Ms. Gillyard continue to follow the cancer screening guidelines provided by her primary healthcare providers.   RECOMMENDATIONS FOR FAMILY MEMBERS: Women in this family  might be at some increased risk of developing cancer, over the general population risk, simply due to the family history of cancer. We recommended women in this family have a yearly mammogram beginning at age 68, or 68 years younger than the earliest onset of cancer, an an annual clinical breast exam, and perform monthly breast self-exams. Women in this family should also have a gynecological exam as recommended by their primary provider. All  family members should have a colonoscopy by age 2.  FOLLOW-UP: Lastly, we discussed with Ms. Rosamilia that cancer genetics is a rapidly advancing field and it is possible that new genetic tests will be appropriate for her and/or her family members in the future. We encouraged her to remain in contact with cancer genetics on an annual basis so we can update her personal and family histories and let her know of advances in cancer genetics that may benefit this family.   Our contact number was provided. Ms. Knezevic questions were answered to her satisfaction, and she knows she is welcome to call us at anytime with additional questions or concerns.   Roma Kayser, MS, [facility] Certified Genetic Counselor [email or web address]

## 2015-08-14 ENCOUNTER — Ambulatory Visit
Admission: RE | Admit: 2015-08-14 | Discharge: 2015-08-14 | Disposition: A | Discharge status: Home / Self Care | Payer: BC Managed Care – PPO | Source: Ambulatory Visit | Attending: General Surgery | Admitting: General Surgery

## 2015-08-14 DIAGNOSIS — D0501 Lobular carcinoma in situ of right breast: Secondary | ICD-10-CM

## 2015-08-16 NOTE — H&P (Signed)
History of Present Illness Marland Kitchen T. Aleli Navedo MD; 07/24/2015 10:45 AM) The patient is a 55 year old female who presents with a complaint of Breast problems. 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 She has had regular screening with no further problems. However recent screening mammogram of the right breast showed some new calcifications. Further diagnostic mammogram revealed a new 6 mm group of calcifications in the upper outer right breast. 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 She has had no breast symptoms, specifically lump or pain, nipple discharge or skin changes. She is obviously very concerned in light of her previous diagnosis.   Other Problems Elbert Ewings, CMA; 07/24/2015 9:53 AM) Anxiety Disorder Breast Cancer Gastroesophageal Reflux Disease Thyroid Disease  Past Surgical History Elbert Ewings, CMA; 07/24/2015 9:53 AM) Breast Biopsy Left. Breast Reconstruction Left. Hysterectomy (not due to cancer) - Partial Mastectomy Left. Oral Surgery Sentinel Lymph Node Biopsy Spinal Surgery - Lower Back Tonsillectomy  Diagnostic Studies History Elbert Ewings, CMA; 07/24/2015 9:53 AM) Colonoscopy 1-5 years ago Mammogram within last year Pap Smear >5 years ago  Allergies Elbert Ewings, CMA; 07/24/2015 9:53 AM) Sulfa 10 *OPHTHALMIC AGENTS*  Medication History Elbert Ewings, CMA; 07/24/2015 9:54 AM) ALPRAZolam (0.25MG  Tablet, Oral) Active. Azithromycin (250MG  Tablet, Oral) Active. Fluticasone Propionate (50MCG/ACT Suspension, Nasal) Active. Levothyroxine Sodium (112MCG Tablet, Oral) Active. Mometasone Furoate (0.1% Cream, External) Active. ValACYclovir HCl (500MG  Tablet, Oral) Active. Omeprazole (20MG  Capsule DR, Oral) Active. Aspirin (81MG  Tablet, Oral AS needed)  Active. Medications Reconciled  Social History Elbert Ewings, [place]; 07/24/2015 9:53 AM) Caffeine use Coffee, Tea. No alcohol use No drug use Tobacco use Former smoker.  Family History Elbert Ewings, [place]; 07/24/2015 9:53 AM) Anesthetic complications Mother. Arthritis Mother. Diabetes Mellitus Mother. Heart Disease Father.  Pregnancy / Birth History Elbert Ewings, CMA; 07/24/2015 9:53 AM) Age at menarche 63 years. Age of menopause 65-55 Gravida 3 Maternal age 29-25 Para 3    Review of Systems Elbert Ewings CMA; 07/24/2015 9:53 AM) General Not Present- Appetite Loss, Chills, Fatigue, Fever, Night Sweats, Weight Gain and Weight Loss. Skin Not Present- Change in Wart/Mole, Dryness, Hives, Jaundice, New Lesions, Non-Healing Wounds, Rash and Ulcer. HEENT Present- Seasonal Allergies and Wears glasses/contact lenses. Not Present- Earache, Hearing Loss, Hoarseness, Nose Bleed, Oral Ulcers, Ringing in the Ears, Sinus Pain, Sore Throat, Visual Disturbances and Yellow Eyes. Respiratory Not Present- Bloody sputum, Chronic Cough, Difficulty Breathing, Snoring and Wheezing. Breast Not Present- Breast Mass, Breast Pain, Nipple Discharge and Skin Changes. Cardiovascular Not Present- Chest Pain, Difficulty Breathing Lying Down, Leg Cramps, Palpitations, Rapid Heart Rate, Shortness of Breath and Swelling of Extremities. Gastrointestinal Not Present- Abdominal Pain, Bloating, Bloody Stool, Change in Bowel Habits, Chronic diarrhea, Constipation, Difficulty Swallowing, Excessive gas, Gets full quickly at meals, Hemorrhoids, Indigestion, Nausea, Rectal Pain and Vomiting. Female Genitourinary Not Present- Frequency, Nocturia, Painful Urination, Pelvic Pain and Urgency. Musculoskeletal Not Present- Back Pain, Joint Pain, Joint Stiffness, Muscle Pain, Muscle Weakness and Swelling of Extremities. Neurological Not Present- Decreased Memory, Fainting, Headaches, Numbness, Seizures, Tingling, Tremor, Trouble  walking and Weakness. Psychiatric Present- Anxiety. Not Present- Bipolar, Change in Sleep Pattern, Depression, Fearful and Frequent crying. Endocrine Not Present- Cold Intolerance, Excessive Hunger, Hair Changes, Heat Intolerance, Hot flashes and New Diabetes. Hematology Not Present- Easy Bruising, Excessive bleeding, Gland problems, HIV and Persistent Infections.  Vitals Elbert Ewings CMA; 07/24/2015  9:55 AM) 07/24/2015 9:54 AM Weight: 198.6 lb Height: 66in Body Surface Area: 1.99 m Body Mass Index: 32.05 kg/m  Temp.: 97.72F  Pulse: 78 (Regular)  BP: 126/72 (Sitting, Left Arm, Standard)       Physical Exam Marland Kitchen T. Amron Guerrette MD; 07/24/2015 10:46 AM) The physical exam findings are as follows: Note:General: Alert, well-developed and well nourished Caucasian female, in no distress Skin: Warm and dry without rash or infection. HEENT: No palpable masses or thyromegaly. Sclera nonicteric. Pupils equal round and reactive. Oropharynx clear. Lymph nodes: No cervical, supraclavicular,nodes palpable. Breasts: Healed reconstruction on the left without masses or skin changes. Slight bruising upper-outer quadrant right breast post core biopsy. No palpable masses. No skin changes or nipple inversion or crusting. Lungs: Breath sounds clear and equal. No wheezing or increased work of breathing. Cardiovascular: Regular rate and rhythm without murmer. No JVD or edema. Peripheral pulses intact. No carotid bruits. Extremities: No edema or joint swelling or deformity. No chronic venous stasis changes. Neurologic: Alert and fully oriented. Gait normal. No focal weakness. Psychiatric: Normal mood and affect. Thought content appropriate with normal judgement and insight    Assessment & Plan Marland Kitchen T. 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 I discussed the diagnosis with the patient and her husband. We discussed this has different implications that her previous ductal carcinoma in situ. I told her it would imply a slight increased risk of cancer in the right breast going forward but in itself for only lobular neoplasia excision or mastectomy does not affect her ultimate survival from breast cancer. I discussed that the area likely should be excised to rule out underlying more serious lesion. After discussion she is comfortable with this plan. The patient also question genetic tendency and as she did have her original cancer prior to age 89 referred for genetic counseling prior to excising this area. We discussed the surgery and its nature and use of general anesthetic and risks of bleeding, infection, anesthetic complications or need for further surgery or treatment based on final pathology. Current Plans Referred to Genetic Counseling, for evaluation and follow up (Medical Genetics). Routine. Pt Education - [facility] Breast Biopsy HCI: discussed with patient and provided information. Radioactive seed localized right breast lumpectomy as an outpatient under general anesthesia

## 2015-08-17 ENCOUNTER — Ambulatory Visit (HOSPITAL_BASED_OUTPATIENT_CLINIC_OR_DEPARTMENT_OTHER): Payer: BC Managed Care – PPO | Admitting: Certified Registered"

## 2015-08-17 ENCOUNTER — Ambulatory Visit (HOSPITAL_BASED_OUTPATIENT_CLINIC_OR_DEPARTMENT_OTHER)
Admission: RE | Admit: 2015-08-17 | Discharge: 2015-08-17 | Disposition: A | Discharge status: Home / Self Care | Payer: BC Managed Care – PPO | Source: Ambulatory Visit | Attending: General Surgery | Admitting: General Surgery

## 2015-08-17 ENCOUNTER — Encounter (HOSPITAL_BASED_OUTPATIENT_CLINIC_OR_DEPARTMENT_OTHER): Payer: Self-pay | Admitting: Certified Registered"

## 2015-08-17 ENCOUNTER — Ambulatory Visit
Admission: RE | Admit: 2015-08-17 | Discharge: 2015-08-17 | Disposition: A | Discharge status: Home / Self Care | Payer: BC Managed Care – PPO | Source: Ambulatory Visit | Attending: General Surgery | Admitting: General Surgery

## 2015-08-17 ENCOUNTER — Encounter (HOSPITAL_BASED_OUTPATIENT_CLINIC_OR_DEPARTMENT_OTHER)
Admission: RE | Disposition: A | Discharge status: Home / Self Care | Payer: Self-pay | Source: Ambulatory Visit | Attending: General Surgery

## 2015-08-17 DIAGNOSIS — Z6832 Body mass index (BMI) 32.0-32.9, adult: Secondary | ICD-10-CM | POA: Diagnosis not present

## 2015-08-17 DIAGNOSIS — E039 Hypothyroidism, unspecified: Secondary | ICD-10-CM | POA: Insufficient documentation

## 2015-08-17 DIAGNOSIS — F329 Major depressive disorder, single episode, unspecified: Secondary | ICD-10-CM | POA: Diagnosis not present

## 2015-08-17 DIAGNOSIS — D0501 Lobular carcinoma in situ of right breast: Secondary | ICD-10-CM

## 2015-08-17 DIAGNOSIS — Z87891 Personal history of nicotine dependence: Secondary | ICD-10-CM | POA: Insufficient documentation

## 2015-08-17 DIAGNOSIS — F419 Anxiety disorder, unspecified: Secondary | ICD-10-CM | POA: Insufficient documentation

## 2015-08-17 DIAGNOSIS — C50911 Malignant neoplasm of unspecified site of right female breast: Secondary | ICD-10-CM | POA: Diagnosis present

## 2015-08-17 HISTORY — DX: Gastro-esophageal reflux disease without esophagitis: K21.9

## 2015-08-17 HISTORY — PX: BREAST LUMPECTOMY WITH RADIOACTIVE SEED LOCALIZATION: SHX6424

## 2015-08-17 HISTORY — DX: Hypothyroidism, unspecified: E03.9

## 2015-08-17 HISTORY — PX: BREAST EXCISIONAL BIOPSY: SUR124

## 2015-08-17 SURGERY — BREAST LUMPECTOMY WITH RADIOACTIVE SEED LOCALIZATION
Anesthesia: General | Site: Breast | Laterality: Right

## 2015-08-17 MED ORDER — MIDAZOLAM HCL 2 MG/2ML IJ SOLN
INTRAMUSCULAR | Status: AC
  Filled 2015-08-17: qty 2

## 2015-08-17 MED ORDER — LIDOCAINE HCL (CARDIAC) 20 MG/ML IV SOLN
INTRAVENOUS | Status: DC | PRN
  Administered 2015-08-17: 60 mg via INTRAVENOUS

## 2015-08-17 MED ORDER — DEXAMETHASONE SODIUM PHOSPHATE 10 MG/ML IJ SOLN
INTRAMUSCULAR | Status: AC
  Filled 2015-08-17: qty 1

## 2015-08-17 MED ORDER — LIDOCAINE HCL (CARDIAC) 20 MG/ML IV SOLN
INTRAVENOUS | Status: AC
  Filled 2015-08-17: qty 5

## 2015-08-17 MED ORDER — LACTATED RINGERS IV SOLN
INTRAVENOUS | Status: DC
  Administered 2015-08-17 (×2): via INTRAVENOUS

## 2015-08-17 MED ORDER — FENTANYL CITRATE (PF) 100 MCG/2ML IJ SOLN
50.0000 ug | INTRAMUSCULAR | Status: DC | PRN
  Administered 2015-08-17: 50 ug via INTRAVENOUS

## 2015-08-17 MED ORDER — HYDROMORPHONE HCL 1 MG/ML IJ SOLN: 0.2500 mg | INTRAMUSCULAR | Status: DC | PRN

## 2015-08-17 MED ORDER — GLYCOPYRROLATE 0.2 MG/ML IJ SOLN: 0.2000 mg | Freq: Once | INTRAMUSCULAR | Status: DC | PRN

## 2015-08-17 MED ORDER — METHYLENE BLUE 0.5 % INJ SOLN
INTRAVENOUS | Status: AC
  Filled 2015-08-17: qty 10

## 2015-08-17 MED ORDER — CHLORHEXIDINE GLUCONATE 4 % EX LIQD: 1.0000 "application " | Freq: Once | CUTANEOUS | Status: DC

## 2015-08-17 MED ORDER — FENTANYL CITRATE (PF) 100 MCG/2ML IJ SOLN
INTRAMUSCULAR | Status: AC
  Filled 2015-08-17: qty 2

## 2015-08-17 MED ORDER — HYDROCODONE-ACETAMINOPHEN 5-325 MG PO TABS: 1.0000 | ORAL_TABLET | ORAL | Status: DC | PRN

## 2015-08-17 MED ORDER — PROMETHAZINE HCL 25 MG/ML IJ SOLN: 6.2500 mg | INTRAMUSCULAR | Status: DC | PRN

## 2015-08-17 MED ORDER — BUPIVACAINE-EPINEPHRINE (PF) 0.25% -1:200000 IJ SOLN
INTRAMUSCULAR | Status: DC | PRN
  Administered 2015-08-17: 20 mL

## 2015-08-17 MED ORDER — MIDAZOLAM HCL 2 MG/2ML IJ SOLN
1.0000 mg | INTRAMUSCULAR | Status: DC | PRN
Start: 2015-08-17 — End: 2015-08-17
  Administered 2015-08-17: 2 mg via INTRAVENOUS

## 2015-08-17 MED ORDER — SODIUM CHLORIDE 0.9 % IJ SOLN
INTRAMUSCULAR | Status: AC
  Filled 2015-08-17: qty 10

## 2015-08-17 MED ORDER — BUPIVACAINE-EPINEPHRINE (PF) 0.25% -1:200000 IJ SOLN
INTRAMUSCULAR | Status: AC
  Filled 2015-08-17: qty 60

## 2015-08-17 MED ORDER — ONDANSETRON HCL 4 MG/2ML IJ SOLN
INTRAMUSCULAR | Status: AC
  Filled 2015-08-17: qty 2

## 2015-08-17 MED ORDER — ONDANSETRON HCL 4 MG/2ML IJ SOLN
INTRAMUSCULAR | Status: DC | PRN
  Administered 2015-08-17: 4 mg via INTRAVENOUS

## 2015-08-17 MED ORDER — CEFAZOLIN SODIUM-DEXTROSE 2-4 GM/100ML-% IV SOLN
INTRAVENOUS | Status: AC
  Filled 2015-08-17: qty 100

## 2015-08-17 MED ORDER — DEXTROSE 5 % IV SOLN
2.0000 g | INTRAVENOUS | Status: AC
  Administered 2015-08-17: 2 g via INTRAVENOUS

## 2015-08-17 MED ORDER — SCOPOLAMINE 1 MG/3DAYS TD PT72: 1.0000 | MEDICATED_PATCH | Freq: Once | TRANSDERMAL | Status: DC | PRN

## 2015-08-17 MED ORDER — DEXAMETHASONE SODIUM PHOSPHATE 4 MG/ML IJ SOLN
INTRAMUSCULAR | Status: DC | PRN
  Administered 2015-08-17: 10 mg via INTRAVENOUS

## 2015-08-17 MED ORDER — PROPOFOL 10 MG/ML IV BOLUS
INTRAVENOUS | Status: DC | PRN
  Administered 2015-08-17: 200 mg via INTRAVENOUS

## 2015-08-17 SURGICAL SUPPLY — 53 items
APPLIER CLIP 9.375 MED OPEN (MISCELLANEOUS)
APR CLP MED 9.3 20 MLT OPN (MISCELLANEOUS)
BINDER BREAST LRG (GAUZE/BANDAGES/DRESSINGS) IMPLANT
BINDER BREAST MEDIUM (GAUZE/BANDAGES/DRESSINGS) IMPLANT
BINDER BREAST XLRG (GAUZE/BANDAGES/DRESSINGS) ×2 IMPLANT
BINDER BREAST XXLRG (GAUZE/BANDAGES/DRESSINGS) IMPLANT
BLADE SURG 15 STRL LF DISP TIS (BLADE) ×1 IMPLANT
BLADE SURG 15 STRL SS (BLADE) ×3
CANISTER SUC SOCK COL 7IN (MISCELLANEOUS) IMPLANT
CANISTER SUCT 1200ML W/VALVE (MISCELLANEOUS) IMPLANT
CHLORAPREP W/TINT 26ML (MISCELLANEOUS) ×3 IMPLANT
CLIP APPLIE 9.375 MED OPEN (MISCELLANEOUS) IMPLANT
CLIP TI WIDE RED SMALL 6 (CLIP) IMPLANT
COVER BACK TABLE 60X90IN (DRAPES) ×3 IMPLANT
COVER MAYO STAND STRL (DRAPES) ×3 IMPLANT
COVER PROBE W GEL 5X96 (DRAPES) ×3 IMPLANT
DECANTER SPIKE VIAL GLASS SM (MISCELLANEOUS) IMPLANT
DEVICE DUBIN W/COMP PLATE 8390 (MISCELLANEOUS) ×3 IMPLANT
DRAPE LAPAROSCOPIC ABDOMINAL (DRAPES) ×3 IMPLANT
DRAPE UTILITY XL STRL (DRAPES) ×3 IMPLANT
ELECT COATED BLADE 2.86 ST (ELECTRODE) ×3 IMPLANT
ELECT REM PT RETURN 9FT ADLT (ELECTROSURGICAL) ×3
ELECTRODE REM PT RTRN 9FT ADLT (ELECTROSURGICAL) ×1 IMPLANT
GLOVE BIOGEL PI IND STRL 7.0 (GLOVE) IMPLANT
GLOVE BIOGEL PI IND STRL 7.5 (GLOVE) IMPLANT
GLOVE BIOGEL PI IND STRL 8 (GLOVE) ×1 IMPLANT
GLOVE BIOGEL PI INDICATOR 7.0 (GLOVE) ×2
GLOVE BIOGEL PI INDICATOR 7.5 (GLOVE) ×2
GLOVE BIOGEL PI INDICATOR 8 (GLOVE) ×2
GLOVE ECLIPSE 7.5 STRL STRAW (GLOVE) ×3 IMPLANT
GLOVE SURG SS PI 7.5 STRL IVOR (GLOVE) ×2 IMPLANT
GOWN STRL REUS W/ TWL LRG LVL3 (GOWN DISPOSABLE) ×1 IMPLANT
GOWN STRL REUS W/ TWL XL LVL3 (GOWN DISPOSABLE) ×1 IMPLANT
GOWN STRL REUS W/TWL LRG LVL3 (GOWN DISPOSABLE) ×3
GOWN STRL REUS W/TWL XL LVL3 (GOWN DISPOSABLE) ×3
ILLUMINATOR WAVEGUIDE N/F (MISCELLANEOUS) ×2 IMPLANT
KIT MARKER MARGIN INK (KITS) ×3 IMPLANT
LIQUID BAND (GAUZE/BANDAGES/DRESSINGS) ×3 IMPLANT
NDL HYPO 25X1 1.5 SAFETY (NEEDLE) ×1 IMPLANT
NEEDLE HYPO 25X1 1.5 SAFETY (NEEDLE) ×3 IMPLANT
NS IRRIG 1000ML POUR BTL (IV SOLUTION) IMPLANT
PACK BASIN DAY SURGERY FS (CUSTOM PROCEDURE TRAY) ×3 IMPLANT
PENCIL BUTTON HOLSTER BLD 10FT (ELECTRODE) ×3 IMPLANT
SLEEVE SCD COMPRESS KNEE MED (MISCELLANEOUS) ×3 IMPLANT
SPONGE LAP 4X18 X RAY DECT (DISPOSABLE) ×3 IMPLANT
SUT MON AB 5-0 PS2 18 (SUTURE) ×3 IMPLANT
SUT VICRYL 3-0 CR8 SH (SUTURE) ×3 IMPLANT
SYR CONTROL 10ML LL (SYRINGE) ×3 IMPLANT
TOWEL OR 17X24 6PK STRL BLUE (TOWEL DISPOSABLE) ×3 IMPLANT
TOWEL OR NON WOVEN STRL DISP B (DISPOSABLE) ×3 IMPLANT
TUBE CONNECTING 20'X1/4 (TUBING)
TUBE CONNECTING 20X1/4 (TUBING) IMPLANT
YANKAUER SUCT BULB TIP NO VENT (SUCTIONS) IMPLANT

## 2015-08-17 NOTE — Anesthesia Postprocedure Evaluation (Signed)
Anesthesia Post Note  Patient: Norma Kelley  Procedure(s) Performed: Procedure(s) (LRB): BREAST LUMPECTOMY WITH RADIOACTIVE SEED LOCALIZATION (Right)  Patient location during evaluation: PACU Anesthesia Type: General Level of consciousness: awake Pain management: pain level controlled Vital Signs Assessment: post-procedure vital signs reviewed and stable Respiratory status: spontaneous breathing Cardiovascular status: stable Anesthetic complications: no    Last Vitals:  Filed Vitals:   08/17/15 1015 08/17/15 1030  BP: 129/85 138/89  Pulse: 75 69  Temp:    Resp: 9 12    Last Pain:  Filed Vitals:   08/17/15 1037  PainSc: 0-No pain                 EDWARDS,Ramell Wacha

## 2015-08-17 NOTE — Transfer of Care (Signed)
Immediate Anesthesia Transfer of Care Note  Patient: Norma Kelley  Procedure(s) Performed: Procedure(s): BREAST LUMPECTOMY WITH RADIOACTIVE SEED LOCALIZATION (Right)  Patient Location: PACU  Anesthesia Type:General  Level of Consciousness: awake and patient cooperative  Airway & Oxygen Therapy: Patient Spontanous Breathing and Patient connected to face mask oxygen  Post-op Assessment: Report given to RN and Post -op Vital signs reviewed and stable  Post vital signs: Reviewed and stable  Last Vitals:  Filed Vitals:   08/17/15 0749  BP: 143/78  Pulse: 73  Temp: 37 C  Resp: 18    Complications: No apparent anesthesia complications

## 2015-08-17 NOTE — Op Note (Signed)
Preoperative Diagnosis: lobular neoplasia right breast  Postoprative Diagnosis: lobular neoplasia right breast  Procedure: Procedure(s): BREAST LUMPECTOMY WITH RADIOACTIVE SEED LOCALIZATION   Surgeon: Excell Seltzer T   Assistants: None  Anesthesia:  General LMA anesthesia  Indications: Patient has a personal history of left breast cancer and recent mammogram within the small area of microcalcifications on the right and core biopsy showing lobular neoplasia (lobular carcinoma in situ). After discussion of options we have elected to excise the area to rule out underlying significant malignancy. The indications and procedure and risks have been discussed and detailed elsewhere.    Procedure Detail:  Patient had previously undergone accurate placement of radioactive seed in the lateral right breast. Seed placement was confirmed in the holding area. She was taken to the operating room, placed in the supine position on the operating table, laryngeal mask general anesthesia induced. She received preoperative IV antibiotics. The right breast was widely sterilely prepped and draped. Patient timeout was performed and correct procedure verified. The neoprobe was used to localize a hot area in the lateral right breast several centimeters from the areolar border. A circumareolar incision was made and a short skin and subcutaneous flap developed laterally over the area of high counts. A firm area was palpable likely secondary to postbiopsy change. Using the neoprobe for guidance approximately 2 cm specimen of breast tissue was excised around the area of high counts. The specimen was removed and inked for margins. Specimen x-ray showed the marking clip in seed centrally located within the specimen. Hemostasis was obtained and the soft tissue infiltrated with Marcaine. Deep subcutaneous tissue was closed with interrupted 3-0 Vicryl and the skin with subcuticular 5-0 Monocryl and Liquiban. Sponge needle and  instrument counts were correct.    Findings: As above  Estimated Blood Loss:  Minimal         Drains: None  Blood Given: none          Specimens: Right breast lumpectomy        Complications:  * No complications entered in OR log *         Disposition: PACU - hemodynamically stable.         Condition: stable

## 2015-08-17 NOTE — Anesthesia Procedure Notes (Signed)
Procedure Name: LMA Insertion Date/Time: 08/17/2015 9:06 AM Performed by: Sander Remedios D Pre-anesthesia Checklist: Patient identified, Emergency Drugs available, Suction available and Patient being monitored Patient Re-evaluated:Patient Re-evaluated prior to inductionOxygen Delivery Method: Circle System Utilized Preoxygenation: Pre-oxygenation with 100% oxygen Intubation Type: IV induction Ventilation: Mask ventilation without difficulty LMA: LMA inserted LMA Size: 4.0 Number of attempts: 1 Airway Equipment and Method: Bite block Placement Confirmation: positive ETCO2 Tube secured with: Tape Dental Injury: Teeth and Oropharynx as per pre-operative assessment

## 2015-08-17 NOTE — Interval H&P Note (Signed)
History and Physical Interval Note:  08/17/2015 8:49 AM  Norma Kelley  has presented today for surgery, with the diagnosis of lobular neoplasia right breast  The various methods of treatment have been discussed with the patient and family. After consideration of risks, benefits and other options for treatment, the patient has consented to  Procedure(s): BREAST LUMPECTOMY WITH RADIOACTIVE SEED LOCALIZATION (Right) as a surgical intervention .  The patient's history has been reviewed, patient examined, no change in status, stable for surgery.  I have reviewed the patient's chart and labs.  Questions were answered to the patient's satisfaction.     Ritchard Paragas T

## 2015-08-17 NOTE — Anesthesia Preprocedure Evaluation (Addendum)
Anesthesia Evaluation  Patient identified by MRN, date of birth, ID band Patient awake    Reviewed: Allergy & Precautions, NPO status , Patient's Chart, lab work & pertinent test results  Airway Mallampati: II  TM Distance: >3 FB Neck ROM: Full    Dental   Pulmonary former smoker,    breath sounds clear to auscultation       Cardiovascular negative cardio ROS   Rhythm:Regular Rate:Normal     Neuro/Psych PSYCHIATRIC DISORDERS Anxiety Depression    GI/Hepatic Neg liver ROS, GERD  Medicated,  Endo/Other  Hypothyroidism Morbid obesity  Renal/GU negative Renal ROS     Musculoskeletal   Abdominal   Peds  Hematology   Anesthesia Other Findings   Reproductive/Obstetrics                            Anesthesia Physical Anesthesia Plan  ASA: II  Anesthesia Plan: General   Post-op Pain Management:    Induction: Intravenous  Airway Management Planned: LMA  Additional Equipment:   Intra-op Plan:   Post-operative Plan: Extubation in OR  Informed Consent: I have reviewed the patients History and Physical, chart, labs and discussed the procedure including the risks, benefits and alternatives for the proposed anesthesia with the patient or authorized representative who has indicated his/her understanding and acceptance.   Dental advisory given  Plan Discussed with: CRNA and Anesthesiologist  Anesthesia Plan Comments:         Anesthesia Quick Evaluation

## 2015-08-17 NOTE — Discharge Instructions (Signed)
[place] Office Phone Number [phone]  BREAST BIOPSY/ PARTIAL  LUMPECTOMY: POST OP INSTRUCTIONS  Always review your discharge instruction sheet given to you by the facility where your surgery was performed.  IF YOU HAVE DISABILITY OR FAMILY LEAVE FORMS, YOU MUST BRING THEM TO THE OFFICE FOR PROCESSING.  DO NOT GIVE THEM TO YOUR DOCTOR.  1. A prescription for pain medication may be given to you upon discharge.  Take your pain medication as prescribed, if needed.  If narcotic pain medicine is not needed, then you may take acetaminophen (Tylenol) or ibuprofen (Advil) as needed. 2. Take your usually prescribed medications unless otherwise directed 3. If you need a refill on your pain medication, please contact your pharmacy.  They will contact our office to request authorization.  Prescriptions will not be filled after 5pm or on week-ends. 4. You should eat very light the first 24 hours after surgery, such as soup, crackers, pudding, etc.  Resume your normal diet the day after surgery. 5. Most patients will experience some swelling and bruising in the breast.  Ice packs and a good support bra will help.  Swelling and bruising can take several days to resolve.  6. It is common to experience some constipation if taking pain medication after surgery.  Increasing fluid intake and taking a stool softener will usually help or prevent this problem from occurring.  A mild laxative (Milk of Magnesia or Miralax) should be taken according to package directions if there are no bowel movements after 48 hours. 7. Unless discharge instructions indicate otherwise, you may remove your bandages 24-48 hours after surgery, and you may shower at that time.  You may have steri-strips (small skin tapes) in place directly over the incision.  These strips should be left on the skin for 7-10 days.  If your surgeon used skin glue on the incision, you may shower in 24 hours.  The glue will flake off over the  next 2-3 weeks.  Any sutures or staples will be removed at the office during your follow-up visit. 8. ACTIVITIES:  You may resume regular daily activities (gradually increasing) beginning the next day.  Wearing a good support bra or sports bra minimizes pain and swelling.  You may have sexual intercourse when it is comfortable. a. You may drive when you no longer are taking prescription pain medication, you can comfortably wear a seatbelt, and you can safely maneuver your car and apply brakes. b. RETURN TO WORK:  ______________________________________________________________________________________ 9. You should see your doctor in the office for a follow-up appointment approximately two weeks after your surgery.  Your doctors nurse will typically make your follow-up appointment when she calls you with your pathology report.  Expect your pathology report 2-3 business days after your surgery.  You may call to check if you do not hear from [place] after three days. 10. OTHER INSTRUCTIONS: _______________________________________________________________________________________________ _____________________________________________________________________________________________________________________________________ _____________________________________________________________________________________________________________________________________ _____________________________________________________________________________________________________________________________________  WHEN TO CALL YOUR DOCTOR: 1. Fever over 101.0 2. Nausea and/or vomiting. 3. Extreme swelling or bruising. 4. Continued bleeding from incision. 5. Increased pain, redness, or drainage from the incision.  The clinic staff is available to answer your questions during regular business hours.  Please dont hesitate to call and ask to speak to one of the nurses for clinical concerns.  If you have a medical emergency, go to the nearest  emergency room or call 911.  A surgeon from [facility] is always on call at the hospital.  For further questions, please visit [email or web address]  [place] Office Phone Number [phone]  BREAST BIOPSY/ PARTIAL MASTECTOMY: POST OP INSTRUCTIONS  Always review your discharge instruction sheet given to you by the facility where your surgery was performed.  IF YOU HAVE DISABILITY OR FAMILY LEAVE FORMS, YOU MUST BRING THEM TO THE OFFICE FOR PROCESSING.  DO NOT GIVE THEM TO YOUR DOCTOR.  11. A prescription for pain medication may be given to you upon discharge.  Take your pain medication as prescribed, if needed.  If narcotic pain medicine is not needed, then you may take acetaminophen (Tylenol) or ibuprofen (Advil) as needed. 12. Take your usually prescribed medications unless otherwise directed 13. If you need a refill on your pain medication, please contact your pharmacy.  They will contact our office to request authorization.  Prescriptions will not be filled after 5pm or on week-ends. 14. You should eat very light the first 24 hours after surgery, such as soup, crackers, pudding, etc.  Resume your normal diet the day after surgery. 15. Most patients will experience some swelling and bruising in the breast.  Ice packs and a good support bra will help.  Swelling and bruising can take several days to resolve.  16. It is common to experience some constipation if taking pain medication after surgery.  Increasing fluid intake and taking a stool softener will usually help or prevent this problem from occurring.  A mild laxative (Milk of Magnesia or Miralax) should be taken according to package directions if there are no bowel movements after 48 hours. 17. Unless discharge instructions indicate otherwise, you may remove your bandages 24-48 hours after surgery, and you may shower at that time.  You may have steri-strips (small skin tapes) in place directly over the  incision.  These strips should be left on the skin for 7-10 days.  If your surgeon used skin glue on the incision, you may shower in 24 hours.  The glue will flake off over the next 2-3 weeks.  Any sutures or staples will be removed at the office during your follow-up visit. 18. ACTIVITIES:  You may resume regular daily activities (gradually increasing) beginning the next day.  Wearing a good support bra or sports bra minimizes pain and swelling.  You may have sexual intercourse when it is comfortable. a. You may drive when you no longer are taking prescription pain medication, you can comfortably wear a seatbelt, and you can safely maneuver your car and apply brakes. b. RETURN TO WORK:  ______________________________________________________________________________________ 19. You should see your doctor in the office for a follow-up appointment approximately two weeks after your surgery.  Your doctors nurse will typically make your follow-up appointment when she calls you with your pathology report.  Expect your pathology report 2-3 business days after your surgery.  You may call to check if you do not hear from [place] after three days.  WHEN TO CALL YOUR DOCTOR: 6. Fever over 101.0 7. Nausea and/or vomiting. 8. Extreme swelling or bruising. 9. Continued bleeding from incision. 10. Increased pain, redness, or drainage from the incision.  The clinic staff is available to answer your questions during regular business hours.  Please dont hesitate to call and ask to speak to one of the nurses for clinical concerns.  If you have a medical emergency, go to the nearest emergency room or call 911.  A surgeon from [facility] is always on call at the hospital.  For further questions, please visit [email or web address]     Post Anesthesia Home Care Instructions  Activity: Get plenty of rest for the remainder of the day. A responsible adult should stay with you for 24 hours following the  procedure.  For the next 24 hours, DO NOT: -Drive a car -Paediatric nurse -Drink alcoholic beverages -Take any medication unless instructed by your physician -Make any legal decisions or sign important papers.  Meals: Start with liquid foods such as gelatin or soup. Progress to regular foods as tolerated. Avoid greasy, spicy, heavy foods. If nausea and/or vomiting occur, drink only clear liquids until the nausea and/or vomiting subsides. Call your physician if vomiting continues.  Special Instructions/Symptoms: Your throat may feel dry or sore from the anesthesia or the breathing tube placed in your throat during surgery. If this causes discomfort, gargle with warm salt water. The discomfort should disappear within 24 hours.  If you had a scopolamine patch placed behind your ear for the management of post- operative nausea and/or vomiting:  1. The medication in the patch is effective for 72 hours, after which it should be removed.  Wrap patch in a tissue and discard in the trash. Wash hands thoroughly with soap and water. 2. You may remove the patch earlier than 72 hours if you experience unpleasant side effects which may include dry mouth, dizziness or visual disturbances. 3. Avoid touching the patch. Wash your hands with soap and water after contact with the patch.

## 2015-08-18 ENCOUNTER — Encounter (HOSPITAL_BASED_OUTPATIENT_CLINIC_OR_DEPARTMENT_OTHER): Payer: Self-pay | Admitting: General Surgery

## 2015-08-24 ENCOUNTER — Other Ambulatory Visit: Payer: Self-pay | Admitting: Family Medicine

## 2015-08-25 NOTE — Telephone Encounter (Signed)
Last alprazolam 07/27/15. Last UDS 05/2014 Last OV (acute): 07/24/15 Next OV:  None scheduled.  Please advise refill?

## 2015-09-01 ENCOUNTER — Encounter: Payer: Self-pay | Admitting: Family Medicine

## 2015-09-01 ENCOUNTER — Ambulatory Visit (INDEPENDENT_AMBULATORY_CARE_PROVIDER_SITE_OTHER): Payer: BC Managed Care – PPO | Admitting: Family Medicine

## 2015-09-01 VITALS — BP 128/81 | HR 77 | Temp 98.4°F | Ht 66.0 in | Wt 204.4 lb

## 2015-09-01 DIAGNOSIS — E039 Hypothyroidism, unspecified: Secondary | ICD-10-CM | POA: Diagnosis not present

## 2015-09-01 DIAGNOSIS — Z0000 Encounter for general adult medical examination without abnormal findings: Secondary | ICD-10-CM

## 2015-09-01 DIAGNOSIS — Z1159 Encounter for screening for other viral diseases: Secondary | ICD-10-CM

## 2015-09-01 NOTE — Progress Notes (Signed)
Subjective:     Norma Kelley is a 55 y.o. female and is here for a comprehensive physical exam. The patient reports no problems.  Social History   Social History  . Marital Status: Married    Spouse Name: N/A  . Number of Children: 3  . Years of Education: N/A   Occupational History  .  [employer]   Social History Main Topics  . Smoking status: Former Smoker -- 1.00 packs/day for 20 years    Types: Cigarettes  . Smokeless tobacco: Never Used     Comment: Quit 14 years ago  . Alcohol Use: No  . Drug Use: No  . Sexual Activity: Yes   Other Topics Concern  . Not on file   Social History Narrative   Lives with husband.    Health Maintenance  Topic Date Due  . Hepatitis C Screening  12-18-1960  . HIV Screening  08/21/1973  . INFLUENZA VACCINE  12/08/2015  . MAMMOGRAM  07/19/2017  . COLONOSCOPY  11/15/2018  . TETANUS/TDAP  12/21/2022    The following portions of the patient's history were reviewed and updated as appropriate:  She  has a past medical history of Anxiety; Thyroid disease; Hypothyroidism; GERD (gastroesophageal reflux disease); and Breast cancer ([place]) (2007). She  does not have any pertinent problems on file. She  has past surgical history that includes Abdominal hysterectomy; Tonsillectomy; Breast surgery; Mastectomy (Left, 2007); Back surgery (2013); and Breast lumpectomy with radioactive seed localization (Right, 08/17/2015). Her family history includes Colon cancer in her maternal grandfather; Diabetes in her mother; Head & neck cancer in her maternal grandmother; Heart attack (age of onset: 60) in her father; Heart attack (age of onset: 68) in her mother; Hyperlipidemia in her mother; Osteoporosis in her mother. There is no history of Pancreatic cancer, Stomach cancer, or Rectal cancer. She  reports that she has quit smoking. Her smoking use included Cigarettes. She has a 20 pack-year smoking history. She has never used smokeless tobacco. She reports that she  does not drink alcohol or use illicit drugs. She has a current medication list which includes the following prescription(s): alprazolam, aspirin, cetirizine, levothyroxine, mometasone, omeprazole, and valacyclovir. Current Outpatient Prescriptions on File Prior to Visit  Medication Sig Dispense Refill  . ALPRAZolam (XANAX) 0.25 MG tablet TAKE 1 TABLET BY MOUTH THREE TIMES DAILY AS NEEDED 60 tablet 0  . aspirin 81 MG tablet Take 81 mg by mouth daily.    . cetirizine (ZYRTEC) 10 MG tablet Take 10 mg by mouth daily.    Marland Kitchen levothyroxine (SYNTHROID, LEVOTHROID) 112 MCG tablet TAKE 1 TABLET BY MOUTH EVERY DAY 90 tablet 0  . mometasone (ELOCON) 0.1 % cream APPLY TO THE AFFECTED AREA AS NEEDED 45 g 1  . Omeprazole ([facility] OMEPRAZOLE) 20 MG TBEC Take 1 tablet by mouth.     . valACYclovir (VALTREX) 500 MG tablet TAKE 1 TABLET BY MOUTH EVERY DAY AS NEEDED 30 tablet 2   No current facility-administered medications on file prior to visit.   She is allergic to sulfamethoxazole and sulfonamide derivatives..  Review of Systems Review of Systems  Constitutional: Negative for activity change, appetite change and fatigue.  HENT: Negative for hearing loss, congestion, tinnitus and ear discharge.  dentist q20m Eyes: Negative for visual disturbance (see optho q1y -- vision corrected to 20/20 with glasses).  Respiratory: Negative for cough, chest tightness and shortness of breath.   Cardiovascular: Negative for chest pain, palpitations and leg swelling.  Gastrointestinal: Negative for abdominal  pain, diarrhea, constipation and abdominal distention.  Genitourinary: Negative for urgency, frequency, decreased urine volume and difficulty urinating.  Musculoskeletal: Negative for back pain, arthralgias and gait problem.  Skin: Negative for color change, pallor and rash.  Neurological: Negative for dizziness, light-headedness, numbness and headaches.  Hematological: Negative for adenopathy. Does not bruise/bleed easily.   Psychiatric/Behavioral: Negative for suicidal ideas, confusion, sleep disturbance, self-injury, dysphoric mood, decreased concentration and agitation.       Objective:    BP 128/81 mmHg  Pulse 77  Temp(Src) 98.4 F (36.9 C) (Oral)  Ht 5\' 6"  (1.676 m)  Wt 204 lb 6.4 oz (92.715 kg)  BMI 33.01 kg/m2  SpO2 97% General appearance: alert, cooperative, appears stated age and no distress Head: Normocephalic, without obvious abnormality, atraumatic Eyes: conjunctivae/corneas clear. PERRL, EOM's intact. Fundi benign. Ears: normal TM's and external ear canals both ears Nose: Nares normal. Septum midline. Mucosa normal. No drainage or sinus tenderness. Throat: lips, mucosa, and tongue normal; teeth and gums normal Neck: no adenopathy, no carotid bruit, no JVD, supple, symmetrical, trachea midline and thyroid not enlarged, symmetric, no tenderness/mass/nodules Back: symmetric, no curvature. ROM normal. No CVA tenderness. Lungs: clear to auscultation bilaterally Breasts: normal appearance, no masses or tenderness Heart: regular rate and rhythm, S1, S2 normal, no murmur, click, rub or gallop Abdomen: soft, non-tender; bowel sounds normal; no masses,  no organomegaly Pelvic: not indicated; status post hysterectomy, negative ROS Extremities: extremities normal, atraumatic, no cyanosis or edema Pulses: 2+ and symmetric Skin: Skin color, texture, turgor normal. No rashes or lesions Lymph nodes: Cervical, supraclavicular, and axillary nodes normal. Neurologic: Alert and oriented X 3, normal strength and tone. Normal symmetric reflexes. Normal coordination and gait    Assessment:    Healthy female exam.    Plan:    ghm utd Check labs See After Visit Summary for Counseling Recommendations  Subjective:     Norma Kelley is a 55 y.o. female and is here for a comprehensive physical exam. The patient reports no problems.  Social History   Social History  . Marital Status: Married     Spouse Name: N/A  . Number of Children: 3  . Years of Education: N/A   Occupational History  .  [employer]   Social History Main Topics  . Smoking status: Former Smoker -- 1.00 packs/day for 20 years    Types: Cigarettes  . Smokeless tobacco: Never Used     Comment: Quit 14 years ago  . Alcohol Use: No  . Drug Use: No  . Sexual Activity: Yes   Other Topics Concern  . Not on file   Social History Narrative   Lives with husband.    Health Maintenance  Topic Date Due  . Hepatitis C Screening  20-Oct-1960  . HIV Screening  08/21/1973  . INFLUENZA VACCINE  12/08/2015  . MAMMOGRAM  07/19/2017  . COLONOSCOPY  11/15/2018  . TETANUS/TDAP  12/21/2022    The following portions of the patient's history were reviewed and updated as appropriate:  She  has a past medical history of Anxiety; Thyroid disease; Hypothyroidism; GERD (gastroesophageal reflux disease); and Breast cancer ([place]) (2007). She  does not have any pertinent problems on file. She  has past surgical history that includes Abdominal hysterectomy; Tonsillectomy; Breast surgery; Mastectomy (Left, 2007); Back surgery (2013); and Breast lumpectomy with radioactive seed localization (Right, 08/17/2015). Her family history includes Colon cancer in her maternal grandfather; Diabetes in her mother; Head & neck cancer in her maternal grandmother; Heart  attack (age of onset: 30) in her father; Heart attack (age of onset: 75) in her mother; Hyperlipidemia in her mother; Osteoporosis in her mother. There is no history of Pancreatic cancer, Stomach cancer, or Rectal cancer. She  reports that she has quit smoking. Her smoking use included Cigarettes. She has a 20 pack-year smoking history. She has never used smokeless tobacco. She reports that she does not drink alcohol or use illicit drugs. She has a current medication list which includes the following prescription(s): alprazolam, aspirin, cetirizine, levothyroxine, mometasone, omeprazole, and  valacyclovir. Current Outpatient Prescriptions on File Prior to Visit  Medication Sig Dispense Refill  . ALPRAZolam (XANAX) 0.25 MG tablet TAKE 1 TABLET BY MOUTH THREE TIMES DAILY AS NEEDED 60 tablet 0  . aspirin 81 MG tablet Take 81 mg by mouth daily.    . cetirizine (ZYRTEC) 10 MG tablet Take 10 mg by mouth daily.    Marland Kitchen levothyroxine (SYNTHROID, LEVOTHROID) 112 MCG tablet TAKE 1 TABLET BY MOUTH EVERY DAY 90 tablet 0  . mometasone (ELOCON) 0.1 % cream APPLY TO THE AFFECTED AREA AS NEEDED 45 g 1  . Omeprazole ([facility] OMEPRAZOLE) 20 MG TBEC Take 1 tablet by mouth.     . valACYclovir (VALTREX) 500 MG tablet TAKE 1 TABLET BY MOUTH EVERY DAY AS NEEDED 30 tablet 2   No current facility-administered medications on file prior to visit.   She is allergic to sulfamethoxazole and sulfonamide derivatives..  Review of Systems Review of Systems  Constitutional: Negative for activity change, appetite change and fatigue.  HENT: Negative for hearing loss, congestion, tinnitus and ear discharge.  dentist q39m Eyes: Negative for visual disturbance (see optho q1y -- vision corrected to 20/20 with glasses).  Respiratory: Negative for cough, chest tightness and shortness of breath.   Cardiovascular: Negative for chest pain, palpitations and leg swelling.  Gastrointestinal: Negative for abdominal pain, diarrhea, constipation and abdominal distention.  Genitourinary: Negative for urgency, frequency, decreased urine volume and difficulty urinating.  Musculoskeletal: Negative for back pain, arthralgias and gait problem.  Skin: Negative for color change, pallor and rash.  Neurological: Negative for dizziness, light-headedness, numbness and headaches.  Hematological: Negative for adenopathy. Does not bruise/bleed easily.  Psychiatric/Behavioral: Negative for suicidal ideas, confusion, sleep disturbance, self-injury, dysphoric mood, decreased concentration and agitation.       Objective:    BP 128/81 mmHg  Pulse  77  Temp(Src) 98.4 F (36.9 C) (Oral)  Ht 5\' 6"  (1.676 m)  Wt 204 lb 6.4 oz (92.715 kg)  BMI 33.01 kg/m2  SpO2 97% General appearance: alert, cooperative, appears stated age and no distress Head: Normocephalic, without obvious abnormality, atraumatic Eyes: conjunctivae/corneas clear. PERRL, EOM's intact. Fundi benign. Ears: normal TM's and external ear canals both ears Nose: Nares normal. Septum midline. Mucosa normal. No drainage or sinus tenderness. Throat: lips, mucosa, and tongue normal; teeth and gums normal Neck: no adenopathy, no carotid bruit, no JVD, supple, symmetrical, trachea midline and thyroid not enlarged, symmetric, no tenderness/mass/nodules Back: symmetric, no curvature. ROM normal. No CVA tenderness. Lungs: clear to auscultation bilaterally Breasts: normal appearance, no masses or tenderness Heart: regular rate and rhythm, S1, S2 normal, no murmur, click, rub or gallop Abdomen: soft, non-tender; bowel sounds normal; no masses,  no organomegaly Pelvic: not indicated; status post hysterectomy, negative ROS Extremities: extremities normal, atraumatic, no cyanosis or edema Pulses: 2+ and symmetric Skin: Skin color, texture, turgor normal. No rashes or lesions Lymph nodes: Cervical, supraclavicular, and axillary nodes normal. Neurologic: Alert and oriented X  3, normal strength and tone. Normal symmetric reflexes. Normal coordination and gait     Assessment:    Healthy female exam.     Plan:    ghm utd  Check labs  See After Visit Summary for Counseling Recommendations    1. Preventative health care   - Lipid panel; Future - POCT urinalysis dipstick; Future - CBC with Differential/Platelet; Future  2. Hypothyroidism, unspecified hypothyroidism type   - TSH; Future  3. Need for hepatitis C screening test   - Hepatitis C antibody; Future

## 2015-09-01 NOTE — Progress Notes (Signed)
Pre visit review using our clinic review tool, if applicable. No additional management support is needed unless otherwise documented below in the visit note. 

## 2015-09-01 NOTE — Patient Instructions (Signed)
Preventive Care for Adults, Female A healthy lifestyle and preventive care can promote health and wellness. Preventive health guidelines for women include the following key practices.  A routine yearly physical is a good way to check with your health care provider about your health and preventive screening. It is a chance to share any concerns and updates on your health and to receive a thorough exam.  Visit your dentist for a routine exam and preventive care every 6 months. Brush your teeth twice a day and floss once a day. Good oral hygiene prevents tooth decay and gum disease.  The frequency of eye exams is based on your age, health, family medical history, use of contact lenses, and other factors. Follow your health care provider's recommendations for frequency of eye exams.  Eat a healthy diet. Foods like vegetables, fruits, whole grains, low-fat dairy products, and lean protein foods contain the nutrients you need without too many calories. Decrease your intake of foods high in solid fats, added sugars, and salt. Eat the right amount of calories for you.Get information about a proper diet from your health care provider, if necessary.  Regular physical exercise is one of the most important things you can do for your health. Most adults should get at least 150 minutes of moderate-intensity exercise (any activity that increases your heart rate and causes you to sweat) each week. In addition, most adults need muscle-strengthening exercises on 2 or more days a week.  Maintain a healthy weight. The body mass index (BMI) is a screening tool to identify possible weight problems. It provides an estimate of body fat based on height and weight. Your health care provider can find your BMI and can help you achieve or maintain a healthy weight.For adults 20 years and older:  A BMI below 18.5 is considered underweight.  A BMI of 18.5 to 24.9 is normal.  A BMI of 25 to 29.9 is considered overweight.  A  BMI of 30 and above is considered obese.  Maintain normal blood lipids and cholesterol levels by exercising and minimizing your intake of saturated fat. Eat a balanced diet with plenty of fruit and vegetables. Blood tests for lipids and cholesterol should begin at age 45 and be repeated every 5 years. If your lipid or cholesterol levels are high, you are over 50, or you are at high risk for heart disease, you may need your cholesterol levels checked more frequently.Ongoing high lipid and cholesterol levels should be treated with medicines if diet and exercise are not working.  If you smoke, find out from your health care provider how to quit. If you do not use tobacco, do not start.  Lung cancer screening is recommended for adults aged 45-80 years who are at high risk for developing lung cancer because of a history of smoking. A yearly low-dose CT scan of the lungs is recommended for people who have at least a 30-pack-year history of smoking and are a current smoker or have quit within the past 15 years. A pack year of smoking is smoking an average of 1 pack of cigarettes a day for 1 year (for example: 1 pack a day for 30 years or 2 packs a day for 15 years). Yearly screening should continue until the smoker has stopped smoking for at least 15 years. Yearly screening should be stopped for people who develop a health problem that would prevent them from having lung cancer treatment.  If you are pregnant, do not drink alcohol. If you are  breastfeeding, be very cautious about drinking alcohol. If you are not pregnant and choose to drink alcohol, do not have more than 1 drink per day. One drink is considered to be 12 ounces (355 mL) of beer, 5 ounces (148 mL) of wine, or 1.5 ounces (44 mL) of liquor.  Avoid use of street drugs. Do not share needles with anyone. Ask for help if you need support or instructions about stopping the use of drugs.  High blood pressure causes heart disease and increases the risk  of stroke. Your blood pressure should be checked at least every 1 to 2 years. Ongoing high blood pressure should be treated with medicines if weight loss and exercise do not work.  If you are 55-79 years old, ask your health care provider if you should take aspirin to prevent strokes.  Diabetes screening is done by taking a blood sample to check your blood glucose level after you have not eaten for a certain period of time (fasting). If you are not overweight and you do not have risk factors for diabetes, you should be screened once every 3 years starting at age 45. If you are overweight or obese and you are 40-70 years of age, you should be screened for diabetes every year as part of your cardiovascular risk assessment.  Breast cancer screening is essential preventive care for women. You should practice "breast self-awareness." This means understanding the normal appearance and feel of your breasts and may include breast self-examination. Any changes detected, no matter how small, should be reported to a health care provider. Women in their 20s and 30s should have a clinical breast exam (CBE) by a health care provider as part of a regular health exam every 1 to 3 years. After age 40, women should have a CBE every year. Starting at age 40, women should consider having a mammogram (breast X-ray test) every year. Women who have a family history of breast cancer should talk to their health care provider about genetic screening. Women at a high risk of breast cancer should talk to their health care providers about having an MRI and a mammogram every year.  Breast cancer gene (BRCA)-related cancer risk assessment is recommended for women who have family members with BRCA-related cancers. BRCA-related cancers include breast, ovarian, tubal, and peritoneal cancers. Having family members with these cancers may be associated with an increased risk for harmful changes (mutations) in the breast cancer genes BRCA1 and  BRCA2. Results of the assessment will determine the need for genetic counseling and BRCA1 and BRCA2 testing.  Your health care provider may recommend that you be screened regularly for cancer of the pelvic organs (ovaries, uterus, and vagina). This screening involves a pelvic examination, including checking for microscopic changes to the surface of your cervix (Pap test). You may be encouraged to have this screening done every 3 years, beginning at age 21.  For women ages 30-65, health care providers may recommend pelvic exams and Pap testing every 3 years, or they may recommend the Pap and pelvic exam, combined with testing for human papilloma virus (HPV), every 5 years. Some types of HPV increase your risk of cervical cancer. Testing for HPV may also be done on women of any age with unclear Pap test results.  Other health care providers may not recommend any screening for nonpregnant women who are considered low risk for pelvic cancer and who do not have symptoms. Ask your health care provider if a screening pelvic exam is right for   you.  If you have had past treatment for cervical cancer or a condition that could lead to cancer, you need Pap tests and screening for cancer for at least 20 years after your treatment. If Pap tests have been discontinued, your risk factors (such as having a new sexual partner) need to be reassessed to determine if screening should resume. Some women have medical problems that increase the chance of getting cervical cancer. In these cases, your health care provider may recommend more frequent screening and Pap tests.  Colorectal cancer can be detected and often prevented. Most routine colorectal cancer screening begins at the age of 50 years and continues through age 75 years. However, your health care provider may recommend screening at an earlier age if you have risk factors for colon cancer. On a yearly basis, your health care provider may provide home test kits to check  for hidden blood in the stool. Use of a small camera at the end of a tube, to directly examine the colon (sigmoidoscopy or colonoscopy), can detect the earliest forms of colorectal cancer. Talk to your health care provider about this at age 50, when routine screening begins. Direct exam of the colon should be repeated every 5-10 years through age 75 years, unless early forms of precancerous polyps or small growths are found.  People who are at an increased risk for hepatitis B should be screened for this virus. You are considered at high risk for hepatitis B if:  You were born in a country where hepatitis B occurs often. Talk with your health care provider about which countries are considered high risk.  Your parents were born in a high-risk country and you have not received a shot to protect against hepatitis B (hepatitis B vaccine).  You have HIV or AIDS.  You use needles to inject street drugs.  You live with, or have sex with, someone who has hepatitis B.  You get hemodialysis treatment.  You take certain medicines for conditions like cancer, organ transplantation, and autoimmune conditions.  Hepatitis C blood testing is recommended for all people born from 1945 through 1965 and any individual with known risks for hepatitis C.  Practice safe sex. Use condoms and avoid high-risk sexual practices to reduce the spread of sexually transmitted infections (STIs). STIs include gonorrhea, chlamydia, syphilis, trichomonas, herpes, HPV, and human immunodeficiency virus (HIV). Herpes, HIV, and HPV are viral illnesses that have no cure. They can result in disability, cancer, and death.  You should be screened for sexually transmitted illnesses (STIs) including gonorrhea and chlamydia if:  You are sexually active and are younger than 24 years.  You are older than 24 years and your health care provider tells you that you are at risk for this type of infection.  Your sexual activity has changed  since you were last screened and you are at an increased risk for chlamydia or gonorrhea. Ask your health care provider if you are at risk.  If you are at risk of being infected with HIV, it is recommended that you take a prescription medicine daily to prevent HIV infection. This is called preexposure prophylaxis (PrEP). You are considered at risk if:  You are sexually active and do not regularly use condoms or know the HIV status of your partner(s).  You take drugs by injection.  You are sexually active with a partner who has HIV.  Talk with your health care provider about whether you are at high risk of being infected with HIV. If   you choose to begin PrEP, you should first be tested for HIV. You should then be tested every 3 months for as long as you are taking PrEP.  Osteoporosis is a disease in which the bones lose minerals and strength with aging. This can result in serious bone fractures or breaks. The risk of osteoporosis can be identified using a bone density scan. Women ages 67 years and over and women at risk for fractures or osteoporosis should discuss screening with their health care providers. Ask your health care provider whether you should take a calcium supplement or vitamin D to reduce the rate of osteoporosis.  Menopause can be associated with physical symptoms and risks. Hormone replacement therapy is available to decrease symptoms and risks. You should talk to your health care provider about whether hormone replacement therapy is right for you.  Use sunscreen. Apply sunscreen liberally and repeatedly throughout the day. You should seek shade when your shadow is shorter than you. Protect yourself by wearing long sleeves, pants, a wide-brimmed hat, and sunglasses year round, whenever you are outdoors.  Once a month, do a whole body skin exam, using a mirror to look at the skin on your back. Tell your health care provider of new moles, moles that have irregular borders, moles that  are larger than a pencil eraser, or moles that have changed in shape or color.  Stay current with required vaccines (immunizations).  Influenza vaccine. All adults should be immunized every year.  Tetanus, diphtheria, and acellular pertussis (Td, Tdap) vaccine. Pregnant women should receive 1 dose of Tdap vaccine during each pregnancy. The dose should be obtained regardless of the length of time since the last dose. Immunization is preferred during the 27th-36th week of gestation. An adult who has not previously received Tdap or who does not know her vaccine status should receive 1 dose of Tdap. This initial dose should be followed by tetanus and diphtheria toxoids (Td) booster doses every 10 years. Adults with an unknown or incomplete history of completing a 3-dose immunization series with Td-containing vaccines should begin or complete a primary immunization series including a Tdap dose. Adults should receive a Td booster every 10 years.  Varicella vaccine. An adult without evidence of immunity to varicella should receive 2 doses or a second dose if she has previously received 1 dose. Pregnant females who do not have evidence of immunity should receive the first dose after pregnancy. This first dose should be obtained before leaving the health care facility. The second dose should be obtained 4-8 weeks after the first dose.  Human papillomavirus (HPV) vaccine. Females aged 13-26 years who have not received the vaccine previously should obtain the 3-dose series. The vaccine is not recommended for use in pregnant females. However, pregnancy testing is not needed before receiving a dose. If a female is found to be pregnant after receiving a dose, no treatment is needed. In that case, the remaining doses should be delayed until after the pregnancy. Immunization is recommended for any person with an immunocompromised condition through the age of 61 years if she did not get any or all doses earlier. During the  3-dose series, the second dose should be obtained 4-8 weeks after the first dose. The third dose should be obtained 24 weeks after the first dose and 16 weeks after the second dose.  Zoster vaccine. One dose is recommended for adults aged 30 years or older unless certain conditions are present.  Measles, mumps, and rubella (MMR) vaccine. Adults born  before 1957 generally are considered immune to measles and mumps. Adults born in 1957 or later should have 1 or more doses of MMR vaccine unless there is a contraindication to the vaccine or there is laboratory evidence of immunity to each of the three diseases. A routine second dose of MMR vaccine should be obtained at least 28 days after the first dose for students attending postsecondary schools, health care workers, or international travelers. People who received inactivated measles vaccine or an unknown type of measles vaccine during 1963-1967 should receive 2 doses of MMR vaccine. People who received inactivated mumps vaccine or an unknown type of mumps vaccine before 1979 and are at high risk for mumps infection should consider immunization with 2 doses of MMR vaccine. For females of childbearing age, rubella immunity should be determined. If there is no evidence of immunity, females who are not pregnant should be vaccinated. If there is no evidence of immunity, females who are pregnant should delay immunization until after pregnancy. Unvaccinated health care workers born before 1957 who lack laboratory evidence of measles, mumps, or rubella immunity or laboratory confirmation of disease should consider measles and mumps immunization with 2 doses of MMR vaccine or rubella immunization with 1 dose of MMR vaccine.  Pneumococcal 13-valent conjugate (PCV13) vaccine. When indicated, a person who is uncertain of his immunization history and has no record of immunization should receive the PCV13 vaccine. All adults 65 years of age and older should receive this  vaccine. An adult aged 19 years or older who has certain medical conditions and has not been previously immunized should receive 1 dose of PCV13 vaccine. This PCV13 should be followed with a dose of pneumococcal polysaccharide (PPSV23) vaccine. Adults who are at high risk for pneumococcal disease should obtain the PPSV23 vaccine at least 8 weeks after the dose of PCV13 vaccine. Adults older than 57 years of age who have normal immune system function should obtain the PPSV23 vaccine dose at least 1 year after the dose of PCV13 vaccine.  Pneumococcal polysaccharide (PPSV23) vaccine. When PCV13 is also indicated, PCV13 should be obtained first. All adults aged 65 years and older should be immunized. An adult younger than age 65 years who has certain medical conditions should be immunized. Any person who resides in a nursing home or long-term care facility should be immunized. An adult smoker should be immunized. People with an immunocompromised condition and certain other conditions should receive both PCV13 and PPSV23 vaccines. People with human immunodeficiency virus (HIV) infection should be immunized as soon as possible after diagnosis. Immunization during chemotherapy or radiation therapy should be avoided. Routine use of PPSV23 vaccine is not recommended for American Indians, [place] Natives, or people younger than 65 years unless there are medical conditions that require PPSV23 vaccine. When indicated, people who have unknown immunization and have no record of immunization should receive PPSV23 vaccine. One-time revaccination 5 years after the first dose of PPSV23 is recommended for people aged 19-64 years who have chronic kidney failure, nephrotic syndrome, asplenia, or immunocompromised conditions. People who received 1-2 doses of PPSV23 before age 65 years should receive another dose of PPSV23 vaccine at age 65 years or later if at least 5 years have passed since the previous dose. Doses of PPSV23 are not  needed for people immunized with PPSV23 at or after age 65 years.  Meningococcal vaccine. Adults with asplenia or persistent complement component deficiencies should receive 2 doses of quadrivalent meningococcal conjugate (MenACWY-D) vaccine. The doses should be obtained   at least 2 months apart. Microbiologists working with certain meningococcal bacteria, [place] recruits, people at risk during an outbreak, and people who travel to or live in countries with a high rate of meningitis should be immunized. A first-year college student up through age 34 years who is living in a residence hall should receive a dose if she did not receive a dose on or after her 16th birthday. Adults who have certain high-risk conditions should receive one or more doses of vaccine.  Hepatitis A vaccine. Adults who wish to be protected from this disease, have certain high-risk conditions, work with hepatitis A-infected animals, work in hepatitis A research labs, or travel to or work in countries with a high rate of hepatitis A should be immunized. Adults who were previously unvaccinated and who anticipate close contact with an international adoptee during the first 60 days after arrival in the [place] from a country with a high rate of hepatitis A should be immunized.  Hepatitis B vaccine. Adults who wish to be protected from this disease, have certain high-risk conditions, may be exposed to blood or other infectious body fluids, are household contacts or sex partners of hepatitis B positive people, are clients or workers in certain care facilities, or travel to or work in countries with a high rate of hepatitis B should be immunized.  Haemophilus influenzae type b (Hib) vaccine. A previously unvaccinated person with asplenia or sickle cell disease or having a scheduled splenectomy should receive 1 dose of Hib vaccine. Regardless of previous immunization, a recipient of a hematopoietic stem cell transplant should receive a  3-dose series 6-12 months after her successful transplant. Hib vaccine is not recommended for adults with HIV infection. Preventive Services / Frequency Ages 35 to 4 years  Blood pressure check.** / Every 3-5 years.  Lipid and cholesterol check.** / Every 5 years beginning at age 60.  Clinical breast exam.** / Every 3 years for women in their 71s and 10s.  BRCA-related cancer risk assessment.** / For women who have family members with a BRCA-related cancer (breast, ovarian, tubal, or peritoneal cancers).  Pap test.** / Every 2 years from ages 76 through 26. Every 3 years starting at age 61 through age 76 or 93 with a history of 3 consecutive normal Pap tests.  HPV screening.** / Every 3 years from ages 37 through ages 60 to 51 with a history of 3 consecutive normal Pap tests.  Hepatitis C blood test.** / For any individual with known risks for hepatitis C.  Skin self-exam. / Monthly.  Influenza vaccine. / Every year.  Tetanus, diphtheria, and acellular pertussis (Tdap, Td) vaccine.** / Consult your health care provider. Pregnant women should receive 1 dose of Tdap vaccine during each pregnancy. 1 dose of Td every 10 years.  Varicella vaccine.** / Consult your health care provider. Pregnant females who do not have evidence of immunity should receive the first dose after pregnancy.  HPV vaccine. / 3 doses over 6 months, if 93 and younger. The vaccine is not recommended for use in pregnant females. However, pregnancy testing is not needed before receiving a dose.  Measles, mumps, rubella (MMR) vaccine.** / You need at least 1 dose of MMR if you were born in 1957 or later. You may also need a 2nd dose. For females of childbearing age, rubella immunity should be determined. If there is no evidence of immunity, females who are not pregnant should be vaccinated. If there is no evidence of immunity, females who are  pregnant should delay immunization until after pregnancy.  Pneumococcal  13-valent conjugate (PCV13) vaccine.** / Consult your health care provider.  Pneumococcal polysaccharide (PPSV23) vaccine.** / 1 to 2 doses if you smoke cigarettes or if you have certain conditions.  Meningococcal vaccine.** / 1 dose if you are age 68 to 8 years and a Market researcher living in a residence hall, or have one of several medical conditions, you need to get vaccinated against meningococcal disease. You may also need additional booster doses.  Hepatitis A vaccine.** / Consult your health care provider.  Hepatitis B vaccine.** / Consult your health care provider.  Haemophilus influenzae type b (Hib) vaccine.** / Consult your health care provider. Ages 7 to 53 years  Blood pressure check.** / Every year.  Lipid and cholesterol check.** / Every 5 years beginning at age 25 years.  Lung cancer screening. / Every year if you are aged 11-80 years and have a 30-pack-year history of smoking and currently smoke or have quit within the past 15 years. Yearly screening is stopped once you have quit smoking for at least 15 years or develop a health problem that would prevent you from having lung cancer treatment.  Clinical breast exam.** / Every year after age 48 years.  BRCA-related cancer risk assessment.** / For women who have family members with a BRCA-related cancer (breast, ovarian, tubal, or peritoneal cancers).  Mammogram.** / Every year beginning at age 41 years and continuing for as long as you are in good health. Consult with your health care provider.  Pap test.** / Every 3 years starting at age 65 years through age 37 or 70 years with a history of 3 consecutive normal Pap tests.  HPV screening.** / Every 3 years from ages 72 years through ages 60 to 40 years with a history of 3 consecutive normal Pap tests.  Fecal occult blood test (FOBT) of stool. / Every year beginning at age 21 years and continuing until age 5 years. You may not need to do this test if you get  a colonoscopy every 10 years.  Flexible sigmoidoscopy or colonoscopy.** / Every 5 years for a flexible sigmoidoscopy or every 10 years for a colonoscopy beginning at age 35 years and continuing until age 48 years.  Hepatitis C blood test.** / For all people born from [birth year] through 1965 and any individual with known risks for hepatitis C.  Skin self-exam. / Monthly.  Influenza vaccine. / Every year.  Tetanus, diphtheria, and acellular pertussis (Tdap/Td) vaccine.** / Consult your health care provider. Pregnant women should receive 1 dose of Tdap vaccine during each pregnancy. 1 dose of Td every 10 years.  Varicella vaccine.** / Consult your health care provider. Pregnant females who do not have evidence of immunity should receive the first dose after pregnancy.  Zoster vaccine.** / 1 dose for adults aged 30 years or older.  Measles, mumps, rubella (MMR) vaccine.** / You need at least 1 dose of MMR if you were born in 1957 or later. You may also need a second dose. For females of childbearing age, rubella immunity should be determined. If there is no evidence of immunity, females who are not pregnant should be vaccinated. If there is no evidence of immunity, females who are pregnant should delay immunization until after pregnancy.  Pneumococcal 13-valent conjugate (PCV13) vaccine.** / Consult your health care provider.  Pneumococcal polysaccharide (PPSV23) vaccine.** / 1 to 2 doses if you smoke cigarettes or if you have certain conditions.  Meningococcal vaccine.** /  Consult your health care provider.  Hepatitis A vaccine.** / Consult your health care provider.  Hepatitis B vaccine.** / Consult your health care provider.  Haemophilus influenzae type b (Hib) vaccine.** / Consult your health care provider. Ages 64 years and over  Blood pressure check.** / Every year.  Lipid and cholesterol check.** / Every 5 years beginning at age 23 years.  Lung cancer screening. / Every year if you  are aged 16-80 years and have a 30-pack-year history of smoking and currently smoke or have quit within the past 15 years. Yearly screening is stopped once you have quit smoking for at least 15 years or develop a health problem that would prevent you from having lung cancer treatment.  Clinical breast exam.** / Every year after age 74 years.  BRCA-related cancer risk assessment.** / For women who have family members with a BRCA-related cancer (breast, ovarian, tubal, or peritoneal cancers).  Mammogram.** / Every year beginning at age 44 years and continuing for as long as you are in good health. Consult with your health care provider.  Pap test.** / Every 3 years starting at age 58 years through age 22 or 39 years with 3 consecutive normal Pap tests. Testing can be stopped between 65 and 70 years with 3 consecutive normal Pap tests and no abnormal Pap or HPV tests in the past 10 years.  HPV screening.** / Every 3 years from ages 64 years through ages 70 or 61 years with a history of 3 consecutive normal Pap tests. Testing can be stopped between 65 and 70 years with 3 consecutive normal Pap tests and no abnormal Pap or HPV tests in the past 10 years.  Fecal occult blood test (FOBT) of stool. / Every year beginning at age 40 years and continuing until age 27 years. You may not need to do this test if you get a colonoscopy every 10 years.  Flexible sigmoidoscopy or colonoscopy.** / Every 5 years for a flexible sigmoidoscopy or every 10 years for a colonoscopy beginning at age 7 years and continuing until age 32 years.  Hepatitis C blood test.** / For all people born from [birth year] through 1967 and any individual with known risks for hepatitis C.  Osteoporosis screening.** / A one-time screening for women ages 30 years and over and women at risk for fractures or osteoporosis.  Skin self-exam. / Monthly.  Influenza vaccine. / Every year.  Tetanus, diphtheria, and acellular pertussis (Tdap/Td)  vaccine.** / 1 dose of Td every 10 years.  Varicella vaccine.** / Consult your health care provider.  Zoster vaccine.** / 1 dose for adults aged 35 years or older.  Pneumococcal 13-valent conjugate (PCV13) vaccine.** / Consult your health care provider.  Pneumococcal polysaccharide (PPSV23) vaccine.** / 1 dose for all adults aged 46 years and older.  Meningococcal vaccine.** / Consult your health care provider.  Hepatitis A vaccine.** / Consult your health care provider.  Hepatitis B vaccine.** / Consult your health care provider.  Haemophilus influenzae type b (Hib) vaccine.** / Consult your health care provider. ** Family history and personal history of risk and conditions may change your health care provider's recommendations.   This information is not intended to replace advice given to you by your health care provider. Make sure you discuss any questions you have with your health care provider.   Document Released: 06/21/2001 Document Revised: 05/16/2014 Document Reviewed: 09/20/2010 Elsevier Interactive Patient Education [employer].

## 2015-09-04 ENCOUNTER — Other Ambulatory Visit (INDEPENDENT_AMBULATORY_CARE_PROVIDER_SITE_OTHER): Payer: BC Managed Care – PPO

## 2015-09-04 ENCOUNTER — Telehealth: Payer: Self-pay | Admitting: *Deleted

## 2015-09-04 DIAGNOSIS — E039 Hypothyroidism, unspecified: Secondary | ICD-10-CM

## 2015-09-04 DIAGNOSIS — Z0000 Encounter for general adult medical examination without abnormal findings: Secondary | ICD-10-CM

## 2015-09-04 DIAGNOSIS — Z1159 Encounter for screening for other viral diseases: Secondary | ICD-10-CM

## 2015-09-04 LAB — POCT URINALYSIS DIPSTICK
Bilirubin, UA: NEGATIVE
GLUCOSE UA: NEGATIVE
Ketones, UA: NEGATIVE
Leukocytes, UA: NEGATIVE
NITRITE UA: NEGATIVE
Protein, UA: NEGATIVE
RBC UA: NEGATIVE
Spec Grav, UA: 1.025
UROBILINOGEN UA: 0.2
pH, UA: 6

## 2015-09-04 LAB — LIPID PANEL
CHOLESTEROL: 165 mg/dL (ref 0–200)
HDL: 54.8 mg/dL (ref >39.00)
LDL Cholesterol: 92 mg/dL (ref 0–99)
NONHDL: 109.9
TRIGLYCERIDES: 89 mg/dL (ref 0.0–149.0)
Total CHOL/HDL Ratio: 3
VLDL: 17.8 mg/dL (ref 0.0–40.0)

## 2015-09-04 LAB — CBC WITH DIFFERENTIAL/PLATELET
BASOS ABS: 0 10*3/uL (ref 0.0–0.1)
BASOS PCT: 0.5 % (ref 0.0–3.0)
EOS ABS: 0.2 10*3/uL (ref 0.0–0.7)
Eosinophils Relative: 2.5 % (ref 0.0–5.0)
HEMATOCRIT: 39 % (ref 36.0–46.0)
HEMOGLOBIN: 13.2 g/dL (ref 12.0–15.0)
LYMPHS PCT: 23.5 % (ref 12.0–46.0)
Lymphs Abs: 1.7 10*3/uL (ref 0.7–4.0)
MCHC: 33.8 g/dL (ref 30.0–36.0)
MCV: 85.9 fl (ref 78.0–100.0)
Monocytes Absolute: 0.4 10*3/uL (ref 0.1–1.0)
Monocytes Relative: 5.9 % (ref 3.0–12.0)
Neutro Abs: 5 10*3/uL (ref 1.4–7.7)
Neutrophils Relative %: 67.6 % (ref 43.0–77.0)
Platelets: 291 10*3/uL (ref 150.0–400.0)
RBC: 4.55 Mil/uL (ref 3.87–5.11)
RDW: 13.5 % (ref 11.5–15.5)
WBC: 7.4 10*3/uL (ref 4.0–10.5)

## 2015-09-04 LAB — HEPATITIS C ANTIBODY: HCV Ab: NEGATIVE

## 2015-09-04 LAB — TSH: TSH: 0.25 u[IU]/mL — ABNORMAL LOW (ref 0.35–4.50)

## 2015-09-04 NOTE — Telephone Encounter (Deleted)
Pt was scheduled for lab appointment this morning with no lab orders. Pt states it was for her blood sugar . A1c was drawn

## 2015-09-09 ENCOUNTER — Encounter (HOSPITAL_COMMUNITY): Payer: Self-pay | Admitting: *Deleted

## 2015-09-09 ENCOUNTER — Emergency Department (HOSPITAL_COMMUNITY): Payer: BC Managed Care – PPO

## 2015-09-09 ENCOUNTER — Telehealth: Payer: Self-pay | Admitting: Family Medicine

## 2015-09-09 ENCOUNTER — Ambulatory Visit (INDEPENDENT_AMBULATORY_CARE_PROVIDER_SITE_OTHER): Payer: BC Managed Care – PPO | Admitting: Medical

## 2015-09-09 ENCOUNTER — Observation Stay (HOSPITAL_COMMUNITY)
Admission: EM | Admit: 2015-09-09 | Discharge: 2015-09-10 | Disposition: A | Discharge status: Home / Self Care | Payer: BC Managed Care – PPO | Attending: Internal Medicine | Admitting: Internal Medicine

## 2015-09-09 ENCOUNTER — Other Ambulatory Visit: Payer: Self-pay

## 2015-09-09 ENCOUNTER — Encounter: Payer: Self-pay | Admitting: Medical

## 2015-09-09 VITALS — BP 138/80 | HR 77 | Temp 98.6°F | Ht 66.0 in | Wt 200.2 lb

## 2015-09-09 DIAGNOSIS — Z79899 Other long term (current) drug therapy: Secondary | ICD-10-CM | POA: Diagnosis not present

## 2015-09-09 DIAGNOSIS — Z9012 Acquired absence of left breast and nipple: Secondary | ICD-10-CM | POA: Insufficient documentation

## 2015-09-09 DIAGNOSIS — F419 Anxiety disorder, unspecified: Secondary | ICD-10-CM | POA: Diagnosis not present

## 2015-09-09 DIAGNOSIS — R195 Other fecal abnormalities: Secondary | ICD-10-CM | POA: Diagnosis not present

## 2015-09-09 DIAGNOSIS — Z8742 Personal history of other diseases of the female genital tract: Secondary | ICD-10-CM | POA: Diagnosis not present

## 2015-09-09 DIAGNOSIS — Z7982 Long term (current) use of aspirin: Secondary | ICD-10-CM | POA: Diagnosis not present

## 2015-09-09 DIAGNOSIS — Z853 Personal history of malignant neoplasm of breast: Secondary | ICD-10-CM | POA: Diagnosis not present

## 2015-09-09 DIAGNOSIS — R51 Headache: Secondary | ICD-10-CM

## 2015-09-09 DIAGNOSIS — R519 Headache, unspecified: Secondary | ICD-10-CM

## 2015-09-09 DIAGNOSIS — E039 Hypothyroidism, unspecified: Secondary | ICD-10-CM | POA: Insufficient documentation

## 2015-09-09 DIAGNOSIS — Z87891 Personal history of nicotine dependence: Secondary | ICD-10-CM | POA: Diagnosis not present

## 2015-09-09 DIAGNOSIS — K219 Gastro-esophageal reflux disease without esophagitis: Secondary | ICD-10-CM | POA: Diagnosis not present

## 2015-09-09 DIAGNOSIS — G039 Meningitis, unspecified: Principal | ICD-10-CM | POA: Insufficient documentation

## 2015-09-09 LAB — CSF CELL COUNT WITH DIFFERENTIAL
Eosinophils, CSF: 3 % — ABNORMAL HIGH (ref 0–1)
LYMPHS CSF: 78 % (ref 40–80)
Lymphs, CSF: 66 % (ref 40–80)
MONOCYTE-MACROPHAGE-SPINAL FLUID: 4 % — AB (ref 15–45)
MONOCYTE-MACROPHAGE-SPINAL FLUID: 5 % — AB (ref 15–45)
RBC COUNT CSF: 4 /mm3 — AB
RBC COUNT CSF: 8 /mm3 — AB
SEGMENTED NEUTROPHILS-CSF: 19 % — AB (ref 0–6)
Segmented Neutrophils-CSF: 17 % — ABNORMAL HIGH (ref 0–6)
Tube #: 1
Tube #: 4
WBC CSF: 113 /mm3 — AB (ref 0–5)
WBC CSF: 173 /mm3 — AB (ref 0–5)

## 2015-09-09 LAB — CBC WITH DIFFERENTIAL/PLATELET
BASOS PCT: 0 %
Basophils Absolute: 0 10*3/uL (ref 0.0–0.1)
EOS ABS: 0 10*3/uL (ref 0.0–0.7)
EOS PCT: 0 %
HCT: 40.1 % (ref 36.0–46.0)
Hemoglobin: 13.5 g/dL (ref 12.0–15.0)
LYMPHS ABS: 1.6 10*3/uL (ref 0.7–4.0)
Lymphocytes Relative: 12 %
MCH: 29.3 pg (ref 26.0–34.0)
MCHC: 33.7 g/dL (ref 30.0–36.0)
MCV: 87 fL (ref 78.0–100.0)
MONOS PCT: 5 %
Monocytes Absolute: 0.7 10*3/uL (ref 0.1–1.0)
Neutro Abs: 11.1 10*3/uL — ABNORMAL HIGH (ref 1.7–7.7)
Neutrophils Relative %: 83 %
PLATELETS: 309 10*3/uL (ref 150–400)
RBC: 4.61 MIL/uL (ref 3.87–5.11)
RDW: 13.2 % (ref 11.5–15.5)
WBC: 13.4 10*3/uL — AB (ref 4.0–10.5)

## 2015-09-09 LAB — COMPREHENSIVE METABOLIC PANEL
ALK PHOS: 100 U/L (ref 38–126)
ALT: 23 U/L (ref 14–54)
AST: 21 U/L (ref 15–41)
Albumin: 5 g/dL (ref 3.5–5.0)
Anion gap: 12 (ref 5–15)
BILIRUBIN TOTAL: 1.1 mg/dL (ref 0.3–1.2)
BUN: 13 mg/dL (ref 6–20)
CALCIUM: 9.7 mg/dL (ref 8.9–10.3)
CHLORIDE: 102 mmol/L (ref 101–111)
CO2: 24 mmol/L (ref 22–32)
CREATININE: 0.9 mg/dL (ref 0.44–1.00)
Glucose, Bld: 106 mg/dL — ABNORMAL HIGH (ref 65–99)
Potassium: 3.5 mmol/L (ref 3.5–5.1)
Sodium: 138 mmol/L (ref 135–145)
Total Protein: 8.3 g/dL — ABNORMAL HIGH (ref 6.5–8.1)

## 2015-09-09 LAB — PROTEIN, CSF: Total  Protein, CSF: 98 mg/dL — ABNORMAL HIGH (ref 15–45)

## 2015-09-09 LAB — URINALYSIS, ROUTINE W REFLEX MICROSCOPIC
Bilirubin Urine: NEGATIVE
Glucose, UA: NEGATIVE mg/dL
Hgb urine dipstick: NEGATIVE
Ketones, ur: NEGATIVE mg/dL
NITRITE: NEGATIVE
PH: 7.5 (ref 5.0–8.0)
Protein, ur: NEGATIVE mg/dL
SPECIFIC GRAVITY, URINE: 1.013 (ref 1.005–1.030)

## 2015-09-09 LAB — I-STAT CG4 LACTIC ACID, ED: Lactic Acid, Venous: 0.99 mmol/L (ref 0.5–2.0)

## 2015-09-09 LAB — URINE MICROSCOPIC-ADD ON

## 2015-09-09 LAB — GLUCOSE, CSF: GLUCOSE CSF: 53 mg/dL (ref 40–70)

## 2015-09-09 MED ORDER — ALPRAZOLAM 0.25 MG PO TABS: 0.2500 mg | ORAL_TABLET | Freq: Three times a day (TID) | ORAL | Status: DC | PRN

## 2015-09-09 MED ORDER — VANCOMYCIN HCL IN DEXTROSE 1-5 GM/200ML-% IV SOLN: 1000.0000 mg | Freq: Once | INTRAVENOUS | Status: DC

## 2015-09-09 MED ORDER — LORATADINE 10 MG PO TABS
10.0000 mg | ORAL_TABLET | Freq: Every day | ORAL | Status: DC
  Administered 2015-09-10: 10 mg via ORAL
  Filled 2015-09-09: qty 1

## 2015-09-09 MED ORDER — KETOROLAC TROMETHAMINE 60 MG/2ML IM SOLN
60.0000 mg | Freq: Once | INTRAMUSCULAR | Status: AC
  Administered 2015-09-09: 60 mg via INTRAMUSCULAR

## 2015-09-09 MED ORDER — ACETAMINOPHEN 325 MG PO TABS: 650.0000 mg | ORAL_TABLET | Freq: Four times a day (QID) | ORAL | Status: DC | PRN

## 2015-09-09 MED ORDER — DEXAMETHASONE SODIUM PHOSPHATE 4 MG/ML IJ SOLN
12.0000 mg | Freq: Four times a day (QID) | INTRAMUSCULAR | Status: DC
  Administered 2015-09-10 (×2): 12 mg via INTRAVENOUS
  Filled 2015-09-09 (×5): qty 3

## 2015-09-09 MED ORDER — VANCOMYCIN HCL 10 G IV SOLR
1250.0000 mg | INTRAVENOUS | Status: AC
  Administered 2015-09-10: 1250 mg via INTRAVENOUS
  Filled 2015-09-09: qty 1250

## 2015-09-09 MED ORDER — ONDANSETRON HCL 4 MG PO TABS: 4.0000 mg | ORAL_TABLET | Freq: Four times a day (QID) | ORAL | Status: DC | PRN

## 2015-09-09 MED ORDER — DEXTROSE 5 % IV SOLN
1.0000 g | Freq: Once | INTRAVENOUS | Status: AC
  Administered 2015-09-10: 1 g via INTRAVENOUS
  Filled 2015-09-09: qty 10

## 2015-09-09 MED ORDER — ONDANSETRON HCL 4 MG/2ML IJ SOLN: 4.0000 mg | Freq: Four times a day (QID) | INTRAMUSCULAR | Status: DC | PRN

## 2015-09-09 MED ORDER — SODIUM CHLORIDE 0.9 % IV BOLUS (SEPSIS)
1000.0000 mL | Freq: Once | INTRAVENOUS | Status: AC
  Administered 2015-09-09: 1000 mL via INTRAVENOUS

## 2015-09-09 MED ORDER — DEXTROSE 5 % IV SOLN
700.0000 mg | Freq: Once | INTRAVENOUS | Status: AC
  Administered 2015-09-09: 700 mg via INTRAVENOUS
  Filled 2015-09-09: qty 14

## 2015-09-09 MED ORDER — HYDROMORPHONE HCL 1 MG/ML IJ SOLN
1.0000 mg | Freq: Once | INTRAMUSCULAR | Status: AC
  Administered 2015-09-09: 1 mg via INTRAVENOUS
  Filled 2015-09-09: qty 1

## 2015-09-09 MED ORDER — MORPHINE SULFATE (PF) 2 MG/ML IV SOLN
2.0000 mg | INTRAVENOUS | Status: DC | PRN
  Administered 2015-09-09: 2 mg via INTRAVENOUS
  Filled 2015-09-09: qty 1

## 2015-09-09 MED ORDER — SODIUM CHLORIDE 0.9 % IV SOLN
INTRAVENOUS | Status: DC
  Administered 2015-09-09: via INTRAVENOUS

## 2015-09-09 MED ORDER — ONDANSETRON HCL 4 MG/2ML IJ SOLN
4.0000 mg | Freq: Once | INTRAMUSCULAR | Status: DC
  Administered 2015-09-09: 4 mg via INTRAVENOUS
  Filled 2015-09-09: qty 2

## 2015-09-09 MED ORDER — MORPHINE SULFATE (PF) 4 MG/ML IV SOLN
4.0000 mg | Freq: Once | INTRAVENOUS | Status: AC
  Administered 2015-09-09: 4 mg via INTRAVENOUS
  Filled 2015-09-09: qty 1

## 2015-09-09 MED ORDER — PROCHLORPERAZINE EDISYLATE 5 MG/ML IJ SOLN
10.0000 mg | Freq: Once | INTRAMUSCULAR | Status: AC
  Administered 2015-09-09: 10 mg via INTRAVENOUS
  Filled 2015-09-09: qty 2

## 2015-09-09 MED ORDER — ONDANSETRON HCL 4 MG/2ML IJ SOLN
4.0000 mg | Freq: Once | INTRAMUSCULAR | Status: AC
  Administered 2015-09-09: 4 mg via INTRAVENOUS
  Filled 2015-09-09: qty 2

## 2015-09-09 MED ORDER — DEXAMETHASONE SODIUM PHOSPHATE 4 MG/ML IJ SOLN
12.0000 mg | Freq: Four times a day (QID) | INTRAMUSCULAR | Status: DC
  Filled 2015-09-09 (×2): qty 3

## 2015-09-09 MED ORDER — PANTOPRAZOLE SODIUM 40 MG PO TBEC
40.0000 mg | DELAYED_RELEASE_TABLET | Freq: Every day | ORAL | Status: DC
  Administered 2015-09-10: 40 mg via ORAL
  Filled 2015-09-09: qty 1

## 2015-09-09 MED ORDER — DEXTROSE 5 % IV SOLN
1.0000 g | Freq: Once | INTRAVENOUS | Status: AC
  Administered 2015-09-09: 1 g via INTRAVENOUS
  Filled 2015-09-09: qty 10

## 2015-09-09 MED ORDER — LEVOTHYROXINE SODIUM 100 MCG PO TABS: 100.0000 ug | ORAL_TABLET | Freq: Every day | ORAL | Status: DC

## 2015-09-09 MED ORDER — LEVOTHYROXINE SODIUM 100 MCG PO TABS
100.0000 ug | ORAL_TABLET | Freq: Every day | ORAL | Status: DC
  Administered 2015-09-10: 100 ug via ORAL
  Filled 2015-09-09 (×2): qty 1

## 2015-09-09 MED ORDER — ACETAMINOPHEN 650 MG RE SUPP
650.0000 mg | Freq: Four times a day (QID) | RECTAL | Status: DC | PRN
Start: 2015-09-09 — End: 2015-09-10

## 2015-09-09 MED ORDER — DEXAMETHASONE SODIUM PHOSPHATE 10 MG/ML IJ SOLN
10.0000 mg | Freq: Once | INTRAMUSCULAR | Status: AC
  Administered 2015-09-09: 10 mg via INTRAVENOUS
  Filled 2015-09-09: qty 1

## 2015-09-09 MED ORDER — ACETAMINOPHEN 325 MG PO TABS
650.0000 mg | ORAL_TABLET | Freq: Once | ORAL | Status: AC
  Administered 2015-09-09: 650 mg via ORAL
  Filled 2015-09-09: qty 2

## 2015-09-09 NOTE — Telephone Encounter (Signed)
Caller name: Lova Relationship to patient: self Can be reached: [phone]  Reason for call: Pt states that she is not feeling much better and is past 2 hours. She said a CT was discussed at her appt if not feeling better. Please notify pt how to proceed.

## 2015-09-09 NOTE — Progress Notes (Signed)
Pre visit review using our clinic review tool, if applicable. No additional management support is needed unless otherwise documented below in the visit note. 

## 2015-09-09 NOTE — Progress Notes (Signed)
Subjective:    Patient ID: Norma Kelley, female    DOB: [birth year], 57 y.o.   MRN: [id number]  HPI   Pt in some recent HA. Pt states ha since 6 pm last night. Pt denies any severe nasal congestion from allergies. Pt denies any history of chronic ha. Pt states ha is unusual. Pt states 8-9/10 level pain. States rare mild  occasional transient ha in the past. Usually brief and respond to ibuproen or tylenol. She states this is worse ha she every ha.  Pt also states she had chills last night. With 1 loose stool last night. Another one last night. . Pt ate [place] for lunch yesterday. Mild stomach cramping. No nausea. No vomiting. Decreased appetite.   Pt level of ha was 5/10 about 20 minutes after toradol.    Review of Systems  Constitutional: Negative for fever, chills, diaphoresis, activity change and fatigue.  Respiratory: Negative for cough, chest tightness and shortness of breath.   Cardiovascular: Negative for chest pain, palpitations and leg swelling.  Gastrointestinal: Positive for abdominal pain and diarrhea. Negative for nausea and vomiting.       See hpi  Musculoskeletal: Negative for neck pain and neck stiffness.  Neurological: Positive for headaches. Negative for dizziness, syncope, facial asymmetry, speech difficulty, weakness, light-headedness and numbness.  Psychiatric/Behavioral: Negative for behavioral problems, confusion and agitation. The patient is not nervous/anxious.    Past Medical History  Diagnosis Date  . Anxiety   . Thyroid disease   . Hypothyroidism   . GERD (gastroesophageal reflux disease)   . Breast cancer ([place]) 2007    left breast cancer     Social History   Social History  . Marital Status: Married    Spouse Name: N/A  . Number of Children: 3  . Years of Education: N/A   Occupational History  .  [employer]   Social History Main Topics  . Smoking status: Former Smoker -- 1.00 packs/day for 20 years    Types: Cigarettes  . Smokeless tobacco:  Never Used     Comment: Quit 14 years ago  . Alcohol Use: No  . Drug Use: No  . Sexual Activity: Yes   Other Topics Concern  . Not on file   Social History Narrative   Lives with husband.     Past Surgical History  Procedure Laterality Date  . Abdominal hysterectomy    . Tonsillectomy    . Breast surgery      Mastectomy and reconstruction  . Mastectomy Left 2007    mastectomy and tamoxifen  . Back surgery  2013    lumbar  . Breast lumpectomy with radioactive seed localization Right 08/17/2015    Procedure: BREAST LUMPECTOMY WITH RADIOACTIVE SEED LOCALIZATION;  Surgeon: Excell Seltzer, MD;  Location: [facility];  Service: General;  Laterality: Right;    Family History  Problem Relation Age of Onset  . Hyperlipidemia Mother   . Osteoporosis Mother   . Diabetes Mother   . Heart attack Mother 13  . Heart attack Father 49    MASSIVE MI,  Died age 54  . Colon cancer Maternal Grandfather     dx in late 60s-early 70s  . Pancreatic cancer Neg Hx   . Stomach cancer Neg Hx   . Rectal cancer Neg Hx   . Head & neck cancer Maternal Grandmother     cancer of the sinuses    Allergies  Allergen Reactions  . Sulfamethoxazole Itching  Swelling around eyes  . Sulfonamide Derivatives     Current Outpatient Prescriptions on File Prior to Visit  Medication Sig Dispense Refill  . ALPRAZolam (XANAX) 0.25 MG tablet TAKE 1 TABLET BY MOUTH THREE TIMES DAILY AS NEEDED 60 tablet 0  . aspirin 81 MG tablet Take 81 mg by mouth daily.    . cetirizine (ZYRTEC) 10 MG tablet Take 10 mg by mouth daily.    Marland Kitchen levothyroxine (SYNTHROID, LEVOTHROID) 112 MCG tablet TAKE 1 TABLET BY MOUTH EVERY DAY 90 tablet 0  . mometasone (ELOCON) 0.1 % cream APPLY TO THE AFFECTED AREA AS NEEDED 45 g 1  . Omeprazole ([facility] OMEPRAZOLE) 20 MG TBEC Take 1 tablet by mouth.     . valACYclovir (VALTREX) 500 MG tablet TAKE 1 TABLET BY MOUTH EVERY DAY AS NEEDED 30 tablet 2   No current  facility-administered medications on file prior to visit.    BP 126/80 mmHg  Pulse 77  Temp(Src) 98.6 F (37 C) (Oral)  Ht 5' 6"  (1.676 m)  Wt 200 lb 3.2 oz (90.81 kg)  BMI 32.33 kg/m2  SpO2 98%      Objective:   Physical Exam  General Mental Status- Alert. General Appearance- Not in acute distress.   Skin General: Color- Normal Color. Moisture- Normal Moisture.  Neck Carotid Arteries- Normal color. Moisture- Normal Moisture. No carotid bruits. No JVD.  Chest and Lung Exam Auscultation: Breath Sounds:-Normal.  Cardiovascular Auscultation:Rythm- Regular. Murmurs & Other Heart Sounds:Auscultation of the heart reveals- No Murmurs.  Abdomen Inspection:-Inspeection Normal. Palpation/Percussion:Note:No mass. Palpation and Percussion of the abdomen reveal- Non Tender, Non Distended + BS, no rebound or guarding.  Neurologic Cranial Nerve exam:- CN III-XII intact(No nystagmus), symmetric smile. Drift Test:- No drift. Romberg Exam:- Negative.  Heal to Toe Gait exam:-Normal. Finger to Nose:- Normal/Intact Strength:- 5/5 equal and symmetric strength both upper and lower extremities.      Assessment & Plan:  Your ha decreased a great deal after toradol im and you had negative exam. I want you to call our office at 2 pm and give update on level of ha at that time. If ha worsens or any neuro signs or symptoms as discussed then ED evaluation.  Also need to follow you closely and see if in chronic recurrent ha developing. I don't think ct of head indicated presently but may order later depending on how you do.  For possible  food poising bland diet guideline. Hydrate with propel. If more loose stools then get stool panel kit and turn in.  Follow up in 7-10 days or as needed

## 2015-09-09 NOTE — ED Notes (Addendum)
Pt complains of headache, neck pain, fever since last night. Pt states she took ibuprofen without relief. Pt states she began feeling nauseas this afternoon. Pt states she has a hx of meninginitis and states the symptoms feel similar. Pt states her lower back started hurting this afternoon.   Pt received Toradol shot from her PCP, which she states did not help. PCP told her to come to ED.

## 2015-09-09 NOTE — ED Provider Notes (Signed)
CSN: [id number]     Arrival date & time 09/09/15  1713 History   First MD Initiated Contact with Patient 09/09/15 1802     Chief Complaint  Patient presents with  . Headache  . Fever  . Neck Pain   PT IS HERE WITH H/A, NECK PAIN, AND FEVER.  PT SAID IT STARTED LAST NIGHT.  SHE HAS A HX OF MENINGITIS (12 YEARS AGO) AND IT FEELS SIMILAR TO THAT EPISODE.  THE PT RECEIVED TORADOL BY PCP WHICH DID NOT HELP.  PCP TOLD HER TO COME TO THE ED FOR MENINGITIS EVAL. (Consider location/radiation/quality/duration/timing/severity/associated sxs/prior Treatment) Patient is a 55 y.o. female presenting with headaches, fever, and neck pain. The history is provided by the patient.  Headache Pain location:  Generalized Quality:  Stabbing Radiates to:  Does not radiate Progression:  Worsening Chronicity:  New Worsened by:  Light Ineffective treatments:  NSAIDs Associated symptoms: fever and neck pain   Fever Associated symptoms: headaches   Neck Pain Associated symptoms: fever and headaches     Past Medical History  Diagnosis Date  . Anxiety   . Thyroid disease   . Hypothyroidism   . GERD (gastroesophageal reflux disease)   . Breast cancer [facility]) 2007    left breast cancer   Past Surgical History  Procedure Laterality Date  . Abdominal hysterectomy    . Tonsillectomy    . Breast surgery      Mastectomy and reconstruction  . Mastectomy Left 2007    mastectomy and tamoxifen  . Back surgery  2013    lumbar  . Breast lumpectomy with radioactive seed localization Right 08/17/2015    Procedure: BREAST LUMPECTOMY WITH RADIOACTIVE SEED LOCALIZATION;  Surgeon: Excell Seltzer, MD;  Location: [place];  Service: General;  Laterality: Right;   Family History  Problem Relation Age of Onset  . Hyperlipidemia Mother   . Osteoporosis Mother   . Diabetes Mother   . Heart attack Mother 51  . Heart attack Father 93    MASSIVE MI,  Died age 20  . Colon cancer Maternal Grandfather      dx in late 60s-early 70s  . Pancreatic cancer Neg Hx   . Stomach cancer Neg Hx   . Rectal cancer Neg Hx   . Head & neck cancer Maternal Grandmother     cancer of the sinuses   Social History  Substance Use Topics  . Smoking status: Former Smoker -- 1.00 packs/day for 20 years    Types: Cigarettes  . Smokeless tobacco: Never Used     Comment: Quit 14 years ago  . Alcohol Use: No   OB History    No data available     Review of Systems  Constitutional: Positive for fever.  Musculoskeletal: Positive for neck pain.  Neurological: Positive for headaches.  All other systems reviewed and are negative.     Allergies  Sulfamethoxazole and Sulfonamide derivatives  Home Medications   Prior to Admission medications   Medication Sig Start Date End Date Taking? Authorizing Provider  ALPRAZolam (XANAX) 0.25 MG tablet TAKE 1 TABLET BY MOUTH THREE TIMES DAILY AS NEEDED Patient taking differently: TAKE 1 TABLET BY MOUTH THREE TIMES DAILY AS NEEDED FOR SLEEP 08/25/15  Yes Yvonne R Lowne Chase, DO  aspirin 81 MG tablet Take 81 mg by mouth daily.   Yes Historical Provider, MD  cetirizine (ZYRTEC) 10 MG tablet Take 10 mg by mouth daily.   Yes Historical Provider, MD  levothyroxine (SYNTHROID,  LEVOTHROID) 100 MCG tablet Take 1 tablet (100 mcg total) by mouth daily. 09/09/15  Yes Yvonne R Lowne Chase, DO  Omeprazole ([employer]S OMEPRAZOLE) 20 MG TBEC Take 1 tablet by mouth daily.    Yes Historical Provider, MD  HYDROcodone-acetaminophen (NORCO/VICODIN) 5-325 MG tablet TK 1 TO 2 TS PO Q 4 H PRF MODERATE OR SEVERE PAIN 08/17/15   Historical Provider, MD  mometasone (ELOCON) 0.1 % cream APPLY TO THE AFFECTED AREA AS NEEDED Patient taking differently: APPLY TO THE AFFECTED AREA AS NEEDED FOR DRY RASH 02/23/15   Yvonne R Lowne Chase, DO  valACYclovir (VALTREX) 500 MG tablet TAKE 1 TABLET BY MOUTH EVERY DAY AS NEEDED Patient taking differently: TAKE 1 TABLET BY MOUTH EVERY DAY AS NEEDED FOR OUTBREAK 04/01/15    Yvonne R Lowne Chase, DO   BP 132/75 mmHg  Pulse 92  Temp(Src) 100.1 F (37.8 C)  Resp 16  SpO2 96% Physical Exam  Constitutional: She is oriented to person, place, and time. She appears well-developed and well-nourished.  HENT:  Head: Normocephalic and atraumatic.  Right Ear: External ear normal.  Left Ear: External ear normal.  Nose: Nose normal.  Mouth/Throat: Oropharynx is clear and moist.  Eyes: Conjunctivae and EOM are normal. Pupils are equal, round, and reactive to light.  Neck: Normal range of motion. Neck supple.  Cardiovascular: Normal rate, regular rhythm, normal heart sounds and intact distal pulses.   Pulmonary/Chest: Effort normal and breath sounds normal.  Abdominal: Soft. Bowel sounds are normal.  Musculoskeletal: Normal range of motion.  Neurological: She is alert and oriented to person, place, and time.  Skin: Skin is warm and dry.  Psychiatric: She has a normal mood and affect. Her behavior is normal. Judgment and thought content normal.  Nursing note and vitals reviewed.   ED Course  .Lumbar Puncture Date/Time: 09/09/2015 8:43 PM Performed by: Isla Pence Authorized by: Isla Pence Consent: Verbal consent obtained. Risks and benefits: risks, benefits and alternatives were discussed Consent given by: patient Patient understanding: patient states understanding of the procedure being performed Patient consent: the patient's understanding of the procedure matches consent given Procedure consent: procedure consent matches procedure scheduled Relevant documents: relevant documents present and verified Test results: test results available and properly labeled Site marked: the operative site was marked Imaging studies: imaging studies available Required items: required blood products, implants, devices, and special equipment available Patient identity confirmed: verbally with patient Indications: evaluation for infection Anesthesia: local  infiltration Local anesthetic: lidocaine 1% without epinephrine Anesthetic total: 2 ml Patient sedated: no Lumbar space: L3-L4 interspace Patient's position: sitting Needle gauge: 20 Needle type: spinal needle - Quincke tip Needle length: 3.5 in Number of attempts: 3 Fluid appearance: clear Tubes of fluid: 4 Total volume: 4 ml Post-procedure: site cleaned and adhesive bandage applied Patient tolerance: Patient tolerated the procedure well with no immediate complications   (including critical care time) Labs Review Labs Reviewed  COMPREHENSIVE METABOLIC PANEL - Abnormal; Notable for the following:    Glucose, Bld 106 (*)    Total Protein 8.3 (*)    All other components within normal limits  CBC WITH DIFFERENTIAL/PLATELET - Abnormal; Notable for the following:    WBC 13.4 (*)    Neutro Abs 11.1 (*)    All other components within normal limits  URINALYSIS, ROUTINE W REFLEX MICROSCOPIC (NOT AT [facility]) - Abnormal; Notable for the following:    Leukocytes, UA TRACE (*)    All other components within normal limits  URINE MICROSCOPIC-ADD ON -  Abnormal; Notable for the following:    Squamous Epithelial / LPF 6-30 (*)    Bacteria, UA FEW (*)    All other components within normal limits  CSF CELL COUNT WITH DIFFERENTIAL - Abnormal; Notable for the following:    RBC Count, CSF 4 (*)    WBC, CSF 113 (*)    Segmented Neutrophils-CSF 17 (*)    Monocyte-Macrophage-Spinal Fluid 5 (*)    All other components within normal limits  CSF CELL COUNT WITH DIFFERENTIAL - Abnormal; Notable for the following:    RBC Count, CSF 8 (*)    WBC, CSF 173 (*)    Segmented Neutrophils-CSF 19 (*)    Monocyte-Macrophage-Spinal Fluid 4 (*)    Eosinophils, CSF 3 (*)    All other components within normal limits  PROTEIN, CSF - Abnormal; Notable for the following:    Total  Protein, CSF 98 (*)    All other components within normal limits  CULTURE, BLOOD (ROUTINE X 2)  GRAM STAIN  URINE CULTURE  CULTURE,  BLOOD (ROUTINE X 2)  CSF CULTURE  HERPES SIMPLEX VIRUS CULTURE  GLUCOSE, CSF  HERPES SIMPLEX VIRUS(HSV) DNA BY PCR  PATHOLOGIST SMEAR REVIEW  COMPREHENSIVE METABOLIC PANEL  CBC WITH DIFFERENTIAL/PLATELET  I-STAT CG4 LACTIC ACID, ED  I-STAT CG4 LACTIC ACID, ED    Imaging Review Dg Chest 2 View  09/09/2015  CLINICAL DATA:  Headache, neck pain and fever. Onset last night. No relief from ibuprofen at home. History of meningitis. EXAM: CHEST  2 VIEW COMPARISON:  04/15/2015 FINDINGS: The lungs are clear. The pulmonary vasculature is normal. Heart size is normal. Hilar and mediastinal contours are unremarkable. There is no pleural effusion. IMPRESSION: No active cardiopulmonary disease. Electronically Signed   By: Andreas Newport M.D.   On: 09/09/2015 18:38   Dg Lumbar Spine Complete  09/09/2015  CLINICAL DATA:  55 year old with acute onset of headache, neck pain and fever yesterday and acute onset of low back pain earlier today. Prior L4-5 fusion. EXAM: LUMBAR SPINE - COMPLETE 4+ VIEW COMPARISON:  03/16/2011, 02/11/2011. FINDINGS: In order to be consistent with the numbering of the lumbar spine on the prior examinations, L4-5 will be designated the fusion level with L5 the last lumbar type vertebral body and T12 therefore having small, rudimentary ribs. Anatomic alignment. No fractures. Solid-appearing L4-5 fusion with intact hardware. Mild to moderate disc space narrowing and associated mild endplate spondylosis at L3-4, progressive since the prior examinations. Remaining disc spaces well preserved. No pars defects. No significant facet arthropathy. Sacroiliac joints intact. IMPRESSION: 1. No acute or subacute osseous abnormality. 2. Moderate degenerative disc disease at L3-4, progressive since 2012. 3. Solid-appearing L4-5 fusion without complicating features. 4. Please see above comments regarding the numbering of the lumbar spine. Electronically Signed   By: Evangeline Dakin M.D.   On: 09/09/2015  20:13   Ct Head Wo Contrast  09/09/2015  CLINICAL DATA:  New onset of headache, neck pain, and fever beginning last evening. The patient began feeling nauseated today. Personal history of meningitis with similar symptoms in the past. EXAM: CT HEAD WITHOUT CONTRAST TECHNIQUE: Contiguous axial images were obtained from the base of the skull through the vertex without intravenous contrast. COMPARISON:  None. FINDINGS: No acute cortical infarct, hemorrhage, or mass lesion is present. The ventricles are of normal size. No significant extra-axial fluid collection is evident. The paranasal sinuses and mastoid air cells are clear. The calvarium is intact. The globes and orbits are intact. No significant extracranial  soft tissue lesion is present. IMPRESSION: Negative CT of the head. Electronically Signed   By: San Morelle M.D.   On: 09/09/2015 19:19   I have personally reviewed and evaluated these images and lab results as part of my medical decision-making.   EKG Interpretation None      MDM   Final diagnoses:  Meningitis       Isla Pence, MD 09/09/15 2259

## 2015-09-09 NOTE — ED Notes (Signed)
Pt placed in supine position. Informed that she would need to stay in this position post procedure.

## 2015-09-09 NOTE — H&P (Signed)
History and Physical    Norma Kelley AC:[id number] DOB: 1960-10-04 DOA: 09/09/2015  Referring MD/NP/PA: Dr.Haviland. PCP: Ann Held, DO  Outpatient Specialists: Dr. Excell Seltzer surgeon. Patient coming from: Home.  Chief Complaint: Headache.  HPI: Norma Kelley is a 55 y.o. female with medical history significant of with history of breast cancer in remission, hypothyroidism who has had previous history of meningitis many years ago presents to the ER because of headache and neck pain over the last 24 hours. Patient also has been a subjective feeling of fever and chills. Patient had gone to her PCP had a Toradol shot despite which patient denied headache and presented to the ER. In the ER CT of the head was unremarkable chest x-ray was showing no infiltrates. Lumbar per Joe showing elevated WBC count around 113 and S of the labs are pending. Patient is being admitted for meningitis. Patient denies any recent sick contacts or travel. Patient's headache is global with some photophobia. Denies any loss of consciousness or weakness of extremities.   ED Course: Patient was started on empiric antibiotics after blood cultures and lumbar puncture was done for meningitis.  Review of Systems: As per HPI otherwise 10 point review of systems negative.    Past Medical History  Diagnosis Date  . Anxiety   . Thyroid disease   . Hypothyroidism   . GERD (gastroesophageal reflux disease)   . Breast cancer [facility]) 2007    left breast cancer    Past Surgical History  Procedure Laterality Date  . Abdominal hysterectomy    . Tonsillectomy    . Breast surgery      Mastectomy and reconstruction  . Mastectomy Left 2007    mastectomy and tamoxifen  . Back surgery  2013    lumbar  . Breast lumpectomy with radioactive seed localization Right 08/17/2015    Procedure: BREAST LUMPECTOMY WITH RADIOACTIVE SEED LOCALIZATION;  Surgeon: Excell Seltzer, MD;  Location: [place];  Service:  General;  Laterality: Right;     reports that she has quit smoking. Her smoking use included Cigarettes. She has a 20 pack-year smoking history. She has never used smokeless tobacco. She reports that she does not drink alcohol or use illicit drugs.  Allergies  Allergen Reactions  . Sulfamethoxazole Itching    Swelling around eyes  . Sulfonamide Derivatives     Family History  Problem Relation Age of Onset  . Hyperlipidemia Mother   . Osteoporosis Mother   . Diabetes Mother   . Heart attack Mother 50  . Heart attack Father 63    MASSIVE MI,  Died age 54  . Colon cancer Maternal Grandfather     dx in late 60s-early 70s  . Pancreatic cancer Neg Hx   . Stomach cancer Neg Hx   . Rectal cancer Neg Hx   . Head & neck cancer Maternal Grandmother     cancer of the sinuses    Prior to Admission medications   Medication Sig Start Date End Date Taking? Authorizing Provider  ALPRAZolam (XANAX) 0.25 MG tablet TAKE 1 TABLET BY MOUTH THREE TIMES DAILY AS NEEDED Patient taking differently: TAKE 1 TABLET BY MOUTH THREE TIMES DAILY AS NEEDED FOR SLEEP 08/25/15  Yes Yvonne R Lowne Chase, DO  aspirin 81 MG tablet Take 81 mg by mouth daily.   Yes Historical Provider, MD  cetirizine (ZYRTEC) 10 MG tablet Take 10 mg by mouth daily.   Yes Historical Provider, MD  levothyroxine (SYNTHROID,  LEVOTHROID) 100 MCG tablet Take 1 tablet (100 mcg total) by mouth daily. 09/09/15  Yes Yvonne R Lowne Chase, DO  Omeprazole ([facility] OMEPRAZOLE) 20 MG TBEC Take 1 tablet by mouth daily.    Yes Historical Provider, MD  HYDROcodone-acetaminophen (NORCO/VICODIN) 5-325 MG tablet TK 1 TO 2 TS PO Q 4 H PRF MODERATE OR SEVERE PAIN 08/17/15   Historical Provider, MD  mometasone (ELOCON) 0.1 % cream APPLY TO THE AFFECTED AREA AS NEEDED Patient taking differently: APPLY TO THE AFFECTED AREA AS NEEDED FOR DRY RASH 02/23/15   Yvonne R Lowne Chase, DO  valACYclovir (VALTREX) 500 MG tablet TAKE 1 TABLET BY MOUTH EVERY DAY AS  NEEDED Patient taking differently: TAKE 1 TABLET BY MOUTH EVERY DAY AS NEEDED FOR OUTBREAK 04/01/15   Ann Held, DO    Physical Exam: Filed Vitals:   09/09/15 1802 09/09/15 1804 09/09/15 2157  BP:  162/81 132/75  Pulse: 77  92  Temp: 100.1 F (37.8 C)    Resp:   16  SpO2: 99%  96%      Constitutional: Appears normal. Filed Vitals:   09/09/15 1802 09/09/15 1804 09/09/15 2157  BP:  162/81 132/75  Pulse: 77  92  Temp: 100.1 F (37.8 C)    Resp:   16  SpO2: 99%  96%   Eyes: Anicteric no pallor. ENMT: No discharge from the ears eyes nose or mouth. Neck: Neck rigidity present. No mass felt. Respiratory: No rhonchi or crepitations. Cardiovascular: S1-S2 heard. Abdomen: Soft nontender bowel sounds present. Musculoskeletal: No edema. Skin: No rash. Neurologic: Alert awake oriented to time place and person. Moves all extremities. Psychiatric: Appears normal.   Labs on Admission: I have personally reviewed following labs and imaging studies  CBC:  Recent Labs Lab 09/04/15 0756 09/09/15 1843  WBC 7.4 13.4*  NEUTROABS 5.0 11.1*  HGB 13.2 13.5  HCT 39.0 40.1  MCV 85.9 87.0  PLT 291.0 [id number]   Basic Metabolic Panel:  Recent Labs Lab 09/09/15 1843  NA 138  K 3.5  CL 102  CO2 24  GLUCOSE 106*  BUN 13  CREATININE 0.90  CALCIUM 9.7   GFR: Estimated Creatinine Clearance: 78.3 mL/min (by C-G formula based on Cr of 0.9). Liver Function Tests:  Recent Labs Lab 09/09/15 1843  AST 21  ALT 23  ALKPHOS 100  BILITOT 1.1  PROT 8.3*  ALBUMIN 5.0   No results for input(s): LIPASE, AMYLASE in the last 168 hours. No results for input(s): AMMONIA in the last 168 hours. Coagulation Profile: No results for input(s): INR, PROTIME in the last 168 hours. Cardiac Enzymes: No results for input(s): CKTOTAL, CKMB, CKMBINDEX, TROPONINI in the last 168 hours. BNP (last 3 results) No results for input(s): PROBNP in the last 8760 hours. HbA1C: No results for  input(s): HGBA1C in the last 72 hours. CBG: No results for input(s): GLUCAP in the last 168 hours. Lipid Profile: No results for input(s): CHOL, HDL, LDLCALC, TRIG, CHOLHDL, LDLDIRECT in the last 72 hours. Thyroid Function Tests: No results for input(s): TSH, T4TOTAL, FREET4, T3FREE, THYROIDAB in the last 72 hours. Anemia Panel: No results for input(s): VITAMINB12, FOLATE, FERRITIN, TIBC, IRON, RETICCTPCT in the last 72 hours. Urine analysis:    Component Value Date/Time   COLORURINE YELLOW 09/09/2015 1925   APPEARANCEUR CLEAR 09/09/2015 1925   LABSPEC 1.013 09/09/2015 1925   PHURINE 7.5 09/09/2015 1925   GLUCOSEU NEGATIVE 09/09/2015 1925   GLUCOSEU NEGATIVE 08/13/2007 1659   HGBUR NEGATIVE 09/09/2015  [place] negative 08/14/2008 0000   BILIRUBINUR NEGATIVE 09/09/2015 1925   BILIRUBINUR neg 09/04/2015 0910   KETONESUR NEGATIVE 09/09/2015 1925   PROTEINUR NEGATIVE 09/09/2015 1925   PROTEINUR neg 09/04/2015 0910   UROBILINOGEN 0.2 09/04/2015 0910   UROBILINOGEN negative 08/14/2008 0000   NITRITE NEGATIVE 09/09/2015 1925   NITRITE neg 09/04/2015 0910   LEUKOCYTESUR TRACE* 09/09/2015 1925   Sepsis Labs: @LABRCNTIP (procalcitonin:4,lacticidven:4) ) Recent Results (from the past 240 hour(s))  Blood culture (routine x 2)     Status: None (Preliminary result)   Collection Time: 09/09/15  6:50 PM  Result Value Ref Range Status   Specimen Description BLOOD RIGHT ANTECUBITAL  Final   Special Requests BOTTLES DRAWN AEROBIC AND ANAEROBIC 5 CC  Final   Culture PENDING  Incomplete   Report Status PENDING  Incomplete  Gram stain     Status: None (Preliminary result)   Collection Time: 09/09/15  8:38 PM  Result Value Ref Range Status   Specimen Description CSF  Final   Special Requests Normal  Final   Gram Stain   Final    CYTOSPIN SMEAR WBC PRESENT, PREDOMINANTLY MONONUCLEAR NO ORGANISMS SEEN Gram Stain Report Called to,Read Back By and Verified With: Nilsa Nutting RN 5.3.17 @  2153 BY RICEJ    Report Status PENDING  Incomplete     Radiological Exams on Admission: Dg Chest 2 View  09/09/2015  CLINICAL DATA:  Headache, neck pain and fever. Onset last night. No relief from ibuprofen at home. History of meningitis. EXAM: CHEST  2 VIEW COMPARISON:  04/15/2015 FINDINGS: The lungs are clear. The pulmonary vasculature is normal. Heart size is normal. Hilar and mediastinal contours are unremarkable. There is no pleural effusion. IMPRESSION: No active cardiopulmonary disease. Electronically Signed   By: Andreas Newport M.D.   On: 09/09/2015 18:38   Dg Lumbar Spine Complete  09/09/2015  CLINICAL DATA:  55 year old with acute onset of headache, neck pain and fever yesterday and acute onset of low back pain earlier today. Prior L4-5 fusion. EXAM: LUMBAR SPINE - COMPLETE 4+ VIEW COMPARISON:  03/16/2011, 02/11/2011. FINDINGS: In order to be consistent with the numbering of the lumbar spine on the prior examinations, L4-5 will be designated the fusion level with L5 the last lumbar type vertebral body and T12 therefore having small, rudimentary ribs. Anatomic alignment. No fractures. Solid-appearing L4-5 fusion with intact hardware. Mild to moderate disc space narrowing and associated mild endplate spondylosis at L3-4, progressive since the prior examinations. Remaining disc spaces well preserved. No pars defects. No significant facet arthropathy. Sacroiliac joints intact. IMPRESSION: 1. No acute or subacute osseous abnormality. 2. Moderate degenerative disc disease at L3-4, progressive since 2012. 3. Solid-appearing L4-5 fusion without complicating features. 4. Please see above comments regarding the numbering of the lumbar spine. Electronically Signed   By: Evangeline Dakin M.D.   On: 09/09/2015 20:13   Ct Head Wo Contrast  09/09/2015  CLINICAL DATA:  New onset of headache, neck pain, and fever beginning last evening. The patient began feeling nauseated today. Personal history of meningitis  with similar symptoms in the past. EXAM: CT HEAD WITHOUT CONTRAST TECHNIQUE: Contiguous axial images were obtained from the base of the skull through the vertex without intravenous contrast. COMPARISON:  None. FINDINGS: No acute cortical infarct, hemorrhage, or mass lesion is present. The ventricles are of normal size. No significant extra-axial fluid collection is evident. The paranasal sinuses and mastoid air cells are clear. The calvarium is intact. The globes and  orbits are intact. No significant extracranial soft tissue lesion is present. IMPRESSION: Negative CT of the head. Electronically Signed   By: San Morelle M.D.   On: 09/09/2015 19:19     Assessment/Plan Principal Problem:   Meningitis Active Problems:   BREAST CANCER, HX OF   Hypothyroidism    #1. Meningitis - patient's lumbar puncture shows elevated WBC count at 113. Rest of the lumbar puncture labs are pending. Please follow all pending lumbar puncture labs and blood cultures. For now I have placed patient on vancomycin and ceftriaxone and acyclovir and ampicillin. Patient also has received Decadron which will be continued until final results of the lumbar puncture results are available. Patient is also on pain relieving medications along with antiemetics. #2. Hypothyroidism on Synthroid. #3. History of breast cancer in remission and also has had a breast biopsy last month. Patient states a breast biopsy only showed dysplasia.   DVT prophylaxis: SCDs. Code Status: Full code.  Family Communication: No family at the bedside.  Disposition Plan: Home.  Consults called: None.  Admission status: Inpatient. Likely stay 2 days.    Rise Patience MD [facility] Pager [phone].  If 7PM-7AM, please contact night-coverage [email or web address] Password [email or web address]  09/09/2015, 10:46 PM

## 2015-09-09 NOTE — Progress Notes (Addendum)
Pharmacy Antibiotic Note  Norma Kelley is a 55 y.o. female admitted on 09/09/2015 with meningitis.  Patient presented with complaints of headache, neck pain and fever.  PMH includes history of meningitis.  Pharmacy has been consulted for Vancomycin, Acyclovir, Ceftriaxone dosing.  Patient received Ceftriaxone 1gm IV x 1 in the ED.  Initial one time orders for Vanc & Acyclovir ordered.  Patient BMI = 32 therefore Acyclovir will be dosed based off adjusted body weight  Plan:  Ceftriaxone 1gm IV x 1 additional dose now (for total initial dose of 2gm) then continue with 2gm IV q12h  Acyclovir 700mg  IV q8h  Vancomycin 1250mg  IV q12h  Vancomycin Trough Goal = 15-20 mcg/ml  Follow cultures/sensitivities, renal function    Temp (24hrs), Avg:99.2 F (37.3 C), Min:98.6 F (37 C), Max:100.1 F (37.8 C)   Recent Labs Lab 09/04/15 0756 09/09/15 1843 09/09/15 1907  WBC 7.4 13.4*  --   CREATININE  --  0.90  --   LATICACIDVEN  --   --  0.99    Estimated Creatinine Clearance: 78.3 mL/min (by C-G formula based on Cr of 0.9).    Allergies  Allergen Reactions  . Sulfamethoxazole Itching    Swelling around eyes  . Sulfonamide Derivatives     Antimicrobials this admission: 5/3 Vanc >>   5/3 Acyclovir >>   5/3 Ceftriaxone >>  Dose adjustments this admission:    Microbiology results: 5/3 BCx:   5/3 UCx:    5/3 CSF:    5/3 HSV:    Thank you for allowing pharmacy to be a part of this patient's care.  Everette Rank, PharmD 09/09/2015 11:06 PM

## 2015-09-09 NOTE — Telephone Encounter (Signed)
Spoke with pt and she states that she is not feeling much better and she wants to know if she will need to go ahead with the CT and she wants the headache to go away.  Please advise on note below.

## 2015-09-09 NOTE — Telephone Encounter (Signed)
I talked with pt. Her headache level came back up to 7-8. Then she mentioned history of meningitis in past with atypical presentation. Based on exam, lack of response to toradol I advised got to main ED at [facility] for further evaluation. She may need stronger pain med iv, ct of the head. Maybe mri and maybe spinal tap. Pt expressed understanding and stats would go. I explained we don't have mri available at [facility] except for weekends.

## 2015-09-09 NOTE — Patient Instructions (Addendum)
Your ha decreased a great deal after toradol im and you had negative exam. I want you to call our office at 2 pm and give update on level of ha at that time. If ha worsens or any neuro signs or symptoms as discussed then ED evaluation.  Also need to follow you closely and see if in chronic recurrent ha developing. I don't think ct of head indicated presently but may order later depending on how you do.  For possible  food poising bland diet guideline. Hydrate with propel. If more loose stools then get stool panel kit and turn in.  Follow up in 7-10 days or as needed

## 2015-09-10 DIAGNOSIS — E039 Hypothyroidism, unspecified: Secondary | ICD-10-CM | POA: Diagnosis not present

## 2015-09-10 DIAGNOSIS — Z853 Personal history of malignant neoplasm of breast: Secondary | ICD-10-CM | POA: Diagnosis not present

## 2015-09-10 DIAGNOSIS — G039 Meningitis, unspecified: Secondary | ICD-10-CM | POA: Diagnosis not present

## 2015-09-10 LAB — CBC WITH DIFFERENTIAL/PLATELET
BASOS ABS: 0 10*3/uL (ref 0.0–0.1)
BASOS PCT: 0 %
EOS PCT: 0 %
Eosinophils Absolute: 0 10*3/uL (ref 0.0–0.7)
HCT: 36.6 % (ref 36.0–46.0)
Hemoglobin: 12.3 g/dL (ref 12.0–15.0)
Lymphocytes Relative: 8 %
Lymphs Abs: 0.8 10*3/uL (ref 0.7–4.0)
MCH: 29.3 pg (ref 26.0–34.0)
MCHC: 33.6 g/dL (ref 30.0–36.0)
MCV: 87.1 fL (ref 78.0–100.0)
MONO ABS: 0 10*3/uL — AB (ref 0.1–1.0)
Monocytes Relative: 0 %
Neutro Abs: 9.6 10*3/uL — ABNORMAL HIGH (ref 1.7–7.7)
Neutrophils Relative %: 92 %
PLATELETS: 270 10*3/uL (ref 150–400)
RBC: 4.2 MIL/uL (ref 3.87–5.11)
RDW: 13.1 % (ref 11.5–15.5)
WBC: 10.4 10*3/uL (ref 4.0–10.5)

## 2015-09-10 LAB — COMPREHENSIVE METABOLIC PANEL
ALBUMIN: 4 g/dL (ref 3.5–5.0)
ALK PHOS: 85 U/L (ref 38–126)
ALT: 19 U/L (ref 14–54)
AST: 21 U/L (ref 15–41)
Anion gap: 9 (ref 5–15)
BILIRUBIN TOTAL: 1 mg/dL (ref 0.3–1.2)
BUN: 11 mg/dL (ref 6–20)
CALCIUM: 8.9 mg/dL (ref 8.9–10.3)
CO2: 21 mmol/L — ABNORMAL LOW (ref 22–32)
CREATININE: 0.89 mg/dL (ref 0.44–1.00)
Chloride: 105 mmol/L (ref 101–111)
GFR calc Af Amer: >60 mL/min (ref >60)
GFR calc non Af Amer: >60 mL/min (ref >60)
GLUCOSE: 191 mg/dL — AB (ref 65–99)
Potassium: 4 mmol/L (ref 3.5–5.1)
Sodium: 135 mmol/L (ref 135–145)
TOTAL PROTEIN: 6.9 g/dL (ref 6.5–8.1)

## 2015-09-10 LAB — GRAM STAIN: Special Requests: NORMAL

## 2015-09-10 LAB — PATHOLOGIST SMEAR REVIEW

## 2015-09-10 LAB — CG4 I-STAT (LACTIC ACID): Lactic Acid, Venous: 0.73 mmol/L (ref 0.5–2.0)

## 2015-09-10 MED ORDER — VALACYCLOVIR HCL 1 G PO TABS: 1000.0000 mg | ORAL_TABLET | Freq: Three times a day (TID) | ORAL | Status: DC

## 2015-09-10 MED ORDER — DEXTROSE 5 % IV SOLN
2.0000 g | Freq: Two times a day (BID) | INTRAVENOUS | Status: DC
  Filled 2015-09-10: qty 2

## 2015-09-10 MED ORDER — DEXTROSE 5 % IV SOLN
700.0000 mg | Freq: Three times a day (TID) | INTRAVENOUS | Status: DC
  Administered 2015-09-10: 700 mg via INTRAVENOUS
  Filled 2015-09-10 (×2): qty 14

## 2015-09-10 MED ORDER — VANCOMYCIN HCL 10 G IV SOLR
1250.0000 mg | Freq: Two times a day (BID) | INTRAVENOUS | Status: DC
  Filled 2015-09-10: qty 1250

## 2015-09-10 NOTE — Progress Notes (Signed)
Discharge instructions discussed with patient and family, verbalized agreement and understanding.  Prescriptions given to patient, discharged with family via private auto

## 2015-09-10 NOTE — Discharge Summary (Signed)
Physician Discharge Summary  MARQUISHA NIKOLOV ZOX:[id number] DOB: 1960-09-25 DOA: 09/09/2015  PCP: Ann Held, DO  Admit date: 09/09/2015 Discharge date: 09/10/2015  Time spent: 45 minutes  Recommendations for Outpatient Follow-up:  Patient will be discharged to home.  Patient will need to follow up with primary care provider within one week of discharge.  Patient should continue medications as prescribed.  Patient should follow a regular diet.   Discharge Diagnoses:  Meningitis, likely viral Hypothyroidism History of breast cancer  Discharge Condition: Stble  Diet recommendation: Regular  Filed Weights   09/09/15 2300  Weight: 91.9 kg (202 lb 9.6 oz)    History of present illness:  On 09/09/2015 by Dr. Gean Birchwood Nyeema A Schachter is a 55 y.o. female with medical history significant of with history of breast cancer in remission, hypothyroidism who has had previous history of meningitis many years ago presents to the ER because of headache and neck pain over the last 24 hours. Patient also has been a subjective feeling of fever and chills. Patient had gone to her PCP had a Toradol shot despite which patient denied headache and presented to the ER. In the ER CT of the head was unremarkable chest x-ray was showing no infiltrates. Lumbar per Joe showing elevated WBC count around 113 and S of the labs are pending. Patient is being admitted for meningitis. Patient denies any recent sick contacts or travel. Patient's headache is global with some photophobia. Denies any loss of consciousness or weakness of extremities.   Hospital Course:  Meningitis/headache -Likely viral -Patient did have lumbar puncture done upon admission. WBCs were noted to be 113, glucose was normal -CSF culture shows no growth, no organisms -Patient initially placed on vancomycin, ceftriaxone, acyclovir and ampicillin -She was given Decadron -Spoke with infectious disease Dr. Drucilla Schmidt, who agreed that patient  did not need antibiotics. Could discharge patient without tracts, 1 g 3 times a day for 10 days. -Currently patient is asymptomatic, has been afebrile since admission, no leukocytosis, no headaches, neck or back pain. -Headaches may also have been tension headaches.  -HSV and HIV pending  Hypothyroidism -Continue Synthroid  History of breast cancer -Currently in remission. 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 Tommy Medal, via phone  Discharge Exam: Filed Vitals:   09/09/15 2300 09/10/15 0638  BP: 113/62 123/60  Pulse: 87 73  Temp: 99 F (37.2 C) 97.5 F (36.4 C)  Resp: 16 16     General: Well developed, well nourished, NAD, appears stated age  HEENT: NCAT, mucous membranes moist.  Neck: Supple, no JVD, no masses, Full ROM  Cardiovascular: S1 S2 auscultated, no rubs, murmurs or gallops. Regular rate and rhythm.  Respiratory: Clear to auscultation bilaterally with equal chest rise  Abdomen: Soft, nontender, nondistended, + bowel sounds  Extremities: warm dry without cyanosis clubbing or edema  Neuro: AAOx3, nonfocal  Psych: Normal affect and demeanor with intact judgement and insight  Discharge Instructions      Discharge Instructions    Discharge instructions    Complete by:  As directed   Patient will be discharged to home.  Patient will need to follow up with primary care provider within one week of discharge.  Patient should continue medications as prescribed.  Patient should follow a regular diet.            Medication  List    TAKE these medications        ALPRAZolam 0.25 MG tablet  Commonly known as:  XANAX  TAKE 1 TABLET BY MOUTH THREE TIMES DAILY AS NEEDED     aspirin 81 MG tablet  Take 81 mg by mouth daily.     cetirizine 10 MG tablet  Commonly known as:  ZYRTEC  Take 10  mg by mouth daily.     [employer] OMEPRAZOLE 20 MG Tbec  Generic drug:  Omeprazole  Take 1 tablet by mouth daily.     HYDROcodone-acetaminophen 5-325 MG tablet  Commonly known as:  NORCO/VICODIN  TK 1 TO 2 TS PO Q 4 H PRF MODERATE OR SEVERE PAIN     levothyroxine 100 MCG tablet  Commonly known as:  SYNTHROID, LEVOTHROID  Take 1 tablet (100 mcg total) by mouth daily.     mometasone 0.1 % cream  Commonly known as:  ELOCON  APPLY TO THE AFFECTED AREA AS NEEDED     valACYclovir 500 MG tablet  Commonly known as:  VALTREX  TAKE 1 TABLET BY MOUTH EVERY DAY AS NEEDED     valACYclovir 1000 MG tablet  Commonly known as:  VALTREX  Take 1 tablet (1,000 mg total) by mouth 3 (three) times daily.       Allergies  Allergen Reactions  . Sulfamethoxazole Itching    Swelling around eyes  . Sulfonamide Derivatives       The results of significant diagnostics from this hospitalization (including imaging, microbiology, ancillary and laboratory) are listed below for reference.    Significant Diagnostic Studies: Dg Chest 2 View  09/09/2015  CLINICAL DATA:  Headache, neck pain and fever. Onset last night. No relief from ibuprofen at home. History of meningitis. EXAM: CHEST  2 VIEW COMPARISON:  04/15/2015 FINDINGS: The lungs are clear. The pulmonary vasculature is normal. Heart size is normal. Hilar and mediastinal contours are unremarkable. There is no pleural effusion. IMPRESSION: No active cardiopulmonary disease. Electronically Signed   By: Andreas Newport M.D.   On: 09/09/2015 18:38   Dg Lumbar Spine Complete  09/09/2015  CLINICAL DATA:  55 year old with acute onset of headache, neck pain and fever yesterday and acute onset of low back pain earlier today. Prior L4-5 fusion. EXAM: LUMBAR SPINE - COMPLETE 4+ VIEW COMPARISON:  03/16/2011, 02/11/2011. FINDINGS: In order to be consistent with the numbering of the lumbar spine on the prior examinations, L4-5 will be designated the fusion level with L5 the  last lumbar type vertebral body and T12 therefore having small, rudimentary ribs. Anatomic alignment. No fractures. Solid-appearing L4-5 fusion with intact hardware. Mild to moderate disc space narrowing and associated mild endplate spondylosis at L3-4, progressive since the prior examinations. Remaining disc spaces well preserved. No pars defects. No significant facet arthropathy. Sacroiliac joints intact. IMPRESSION: 1. No acute or subacute osseous abnormality. 2. Moderate degenerative disc disease at L3-4, progressive since 2012. 3. Solid-appearing L4-5 fusion without complicating features. 4. Please see above comments regarding the numbering of the lumbar spine. Electronically Signed   By: Evangeline Dakin M.D.   On: 09/09/2015 20:13   Ct Head Wo Contrast  09/09/2015  CLINICAL DATA:  New onset of headache, neck pain, and fever beginning last evening. The patient began feeling nauseated today. Personal history of meningitis with similar symptoms in the past. EXAM: CT HEAD WITHOUT CONTRAST TECHNIQUE: Contiguous axial images were obtained from the base of the skull through the vertex without intravenous contrast. COMPARISON:  None. FINDINGS:  No acute cortical infarct, hemorrhage, or mass lesion is present. The ventricles are of normal size. No significant extra-axial fluid collection is evident. The paranasal sinuses and mastoid air cells are clear. The calvarium is intact. The globes and orbits are intact. No significant extracranial soft tissue lesion is present. IMPRESSION: Negative CT of the head. Electronically Signed   By: San Morelle M.D.   On: 09/09/2015 19:19   Mm Breast Surgical Specimen  08/17/2015  CLINICAL DATA:  Evaluate surgical specimen following radioactive seed localization and right breast excision of lobular neoplasia. EXAM: SPECIMEN RADIOGRAPH OF THE RIGHT BREAST COMPARISON:  Previous exam(s). FINDINGS: Status post excision of the right breast. The radioactive seed and biopsy  marker clip are present, completely intact, and were marked for pathology. IMPRESSION: Specimen radiograph of the right breast. Electronically Signed   By: Margarette Canada M.D.   On: 08/17/2015 09:44   Mm Rt Radioactive Seed Loc Mammo Guide  08/14/2015  CLINICAL DATA:  Lobular neoplasia upper-outer quadrant right breast EXAM: MAMMOGRAPHIC GUIDED RADIOACTIVE SEED LOCALIZATION OF THE RIGHT BREAST COMPARISON:  Previous exam(s). FINDINGS: Patient presents for radioactive seed localization prior to excisional biopsy. I met with the patient and we discussed the procedure of seed localization including benefits and alternatives. We discussed the high likelihood of a successful procedure. We discussed the risks of the procedure including infection, bleeding, tissue injury and further surgery. We discussed the low dose of radioactivity involved in the procedure. Informed, written consent was given. The usual time-out protocol was performed immediately prior to the procedure. Using mammographic guidance, sterile technique, 1% lidocaine and an I-125 radioactive seed, the marker clip was localized using a lateral medial approach. The follow-up mammogram images confirm the seed in the expected location and were marked for Dr. Excell Seltzer. Follow-up survey of the patient confirms presence of the radioactive seed. Order number of I-125 seed:  [id number]. Total activity:  0.250 mCi  reference Date: 07/31/15 The patient tolerated the procedure well and was released from the [facility]. She was given instructions regarding seed removal. IMPRESSION: Radioactive seed localization right breast. No apparent complications. Electronically Signed   By: Skipper Cliche M.D.   On: 08/14/2015 13:45    Microbiology: Recent Results (from the past 240 hour(s))  Blood culture (routine x 2)     Status: None (Preliminary result)   Collection Time: 09/09/15  6:50 PM  Result Value Ref Range Status   Specimen Description BLOOD RIGHT ANTECUBITAL   Final   Special Requests BOTTLES DRAWN AEROBIC AND ANAEROBIC 5 CC  Final   Culture PENDING  Incomplete   Report Status PENDING  Incomplete  Urine culture     Status: None (Preliminary result)   Collection Time: 09/09/15  7:25 PM  Result Value Ref Range Status   Specimen Description URINE, CLEAN CATCH  Final   Special Requests Normal  Final   Culture   Final    TOO YOUNG TO READ Performed at [facility]    Report Status PENDING  Incomplete  CSF culture     Status: None (Preliminary result)   Collection Time: 09/09/15  8:38 PM  Result Value Ref Range Status   Specimen Description CSF  Final   Special Requests Normal  Final   Gram Stain   Final    WBC PRESENT, PREDOMINANTLY MONONUCLEAR NO ORGANISMS SEEN CYTOSPIN SMEAR    Culture   Final    NO GROWTH < 12 HOURS Performed at [facility]    Report  Status PENDING  Incomplete  Gram stain     Status: None   Collection Time: 09/09/15  8:38 PM  Result Value Ref Range Status   Specimen Description CSF  Final   Special Requests Normal  Final   Gram Stain   Final    CYTOSPIN SMEAR WBC PRESENT, PREDOMINANTLY MONONUCLEAR NO ORGANISMS SEEN Gram Stain Report Called to,Read Back By and Verified With: TERRI DOSTER RN 5.3.17 @ 2153 BY RICEJ    Report Status 09/10/2015 FINAL  Final     Labs: Basic Metabolic Panel:  Recent Labs Lab 09/09/15 1843 09/10/15 0436  NA 138 135  K 3.5 4.0  CL 102 105  CO2 24 21*  GLUCOSE 106* 191*  BUN 13 11  CREATININE 0.90 0.89  CALCIUM 9.7 8.9   Liver Function Tests:  Recent Labs Lab 09/09/15 1843 09/10/15 0436  AST 21 21  ALT 23 19  ALKPHOS 100 85  BILITOT 1.1 1.0  PROT 8.3* 6.9  ALBUMIN 5.0 4.0   No results for input(s): LIPASE, AMYLASE in the last 168 hours. No results for input(s): AMMONIA in the last 168 hours. CBC:  Recent Labs Lab 09/04/15 0756 09/09/15 1843 09/10/15 0436  WBC 7.4 13.4* 10.4  NEUTROABS 5.0 11.1* 9.6*  HGB 13.2 13.5 12.3  HCT 39.0 40.1  36.6  MCV 85.9 87.0 87.1  PLT 291.0 309 270   Cardiac Enzymes: No results for input(s): CKTOTAL, CKMB, CKMBINDEX, TROPONINI in the last 168 hours. BNP: BNP (last 3 results) No results for input(s): BNP in the last 8760 hours.  ProBNP (last 3 results) No results for input(s): PROBNP in the last 8760 hours.  CBG: No results for input(s): GLUCAP in the last 168 hours.     SignedCristal Ford  [facility] 09/10/2015, 1:51 PM

## 2015-09-11 ENCOUNTER — Telehealth: Payer: Self-pay | Admitting: Behavioral Health

## 2015-09-11 LAB — URINE CULTURE: SPECIAL REQUESTS: NORMAL

## 2015-09-11 LAB — HIV ANTIBODY (ROUTINE TESTING W REFLEX): HIV SCREEN 4TH GENERATION: NONREACTIVE

## 2015-09-11 NOTE — Telephone Encounter (Signed)
Transition Care Management Follow-up Telephone Call  PCP: Ann Held, DO  Admit date: 09/09/2015 Discharge date: 09/10/2015  Recommendations for Outpatient Follow-up:  Patient will be discharged to home. Patient will need to follow up with primary care provider within one week of discharge. Patient should continue medications as prescribed. Patient should follow a regular diet.   Discharge Diagnoses:  Meningitis, likely viral Hypothyroidism History of breast cancer  Discharge Condition: Stble    How have you been since you were released from the hospital? Patient stated, "I've been ok, just resting".   Do you understand why you were in the hospital? yes   Do you understand the discharge instructions? yes   Where were you discharged to? Home with husband.   Items Reviewed:  Medications reviewed: yes  Allergies reviewed: yes  Dietary changes reviewed: yes, no changes were made to diet.  Referrals reviewed: yes, patient only instructed to follow-up with PCP.   Functional Questionnaire:   Activities of Daily Living (ADLs):   She states they are independent in the following: ambulation, bathing and hygiene, feeding, continence, grooming, toileting and dressing States they require assistance with the following: None   Any transportation issues/concerns?: no   Any patient concerns? no   Confirmed importance and date/time of follow-up visits scheduled yes, 09/18/15 at 11:30 AM.  Provider Appointment booked with Dr. Carollee Herter.  Confirmed with patient if condition begins to worsen call PCP or go to the ER.  Patient was given the office number and encouraged to call back with question or concerns.  : yes

## 2015-09-13 LAB — HERPES SIMPLEX VIRUS(HSV) DNA BY PCR
HSV 1 DNA: NEGATIVE
HSV 2 DNA: POSITIVE — AB

## 2015-09-13 LAB — CSF CULTURE: CULTURE: NO GROWTH

## 2015-09-13 LAB — CSF CULTURE W GRAM STAIN: Special Requests: NORMAL

## 2015-09-14 LAB — CULTURE, BLOOD (ROUTINE X 2)
Culture: NO GROWTH
Culture: NO GROWTH

## 2015-09-18 ENCOUNTER — Ambulatory Visit (INDEPENDENT_AMBULATORY_CARE_PROVIDER_SITE_OTHER): Payer: BC Managed Care – PPO | Admitting: Family Medicine

## 2015-09-18 ENCOUNTER — Encounter: Payer: Self-pay | Admitting: Family Medicine

## 2015-09-18 VITALS — BP 129/80 | HR 74 | Temp 98.8°F | Ht 66.0 in | Wt 201.0 lb

## 2015-09-18 DIAGNOSIS — G039 Meningitis, unspecified: Secondary | ICD-10-CM

## 2015-09-18 NOTE — Progress Notes (Signed)
Subjective:    Patient ID: Norma Kelley, female    DOB: 1960/07/08, 55 y.o.   MRN: [id number]  Chief Complaint  Patient presents with  . Hospitalization Follow-up    Viral Menningitis    HPI Patient is in today for f/u hosp stay for viral meningitis.  Pt was seen 5/3 in office by PA and treated for migraine and later that day her headache worsened and she developed fever and neck pain.  She called here and was told to go to ER.  Xrays, ct and lumbar puncture was done and pt dx with viral meningitis.  Pt d/c on 4th with valtrex .  She stopped it 2 days ago because of nausea.  Pt went back to work 1/2 days and she tires easily but othewise improving daily.   She will go back to work full time Monday.    Past Medical History  Diagnosis Date  . Anxiety   . Thyroid disease   . Hypothyroidism   . GERD (gastroesophageal reflux disease)   . Breast cancer [facility]) 2007    left breast cancer    Past Surgical History  Procedure Laterality Date  . Abdominal hysterectomy    . Tonsillectomy    . Breast surgery      Mastectomy and reconstruction  . Mastectomy Left 2007    mastectomy and tamoxifen  . Back surgery  2013    lumbar  . Breast lumpectomy with radioactive seed localization Right 08/17/2015    Procedure: BREAST LUMPECTOMY WITH RADIOACTIVE SEED LOCALIZATION;  Surgeon: Excell Seltzer, MD;  Location: [facility];  Service: General;  Laterality: Right;    Family History  Problem Relation Age of Onset  . Hyperlipidemia Mother   . Osteoporosis Mother   . Diabetes Mother   . Heart attack Mother 63  . Heart attack Father 21    MASSIVE MI,  Died age 62  . Colon cancer Maternal Grandfather     dx in late 60s-early 70s  . Pancreatic cancer Neg Hx   . Stomach cancer Neg Hx   . Rectal cancer Neg Hx   . Head & neck cancer Maternal Grandmother     cancer of the sinuses    Social History   Social History  . Marital Status: Married    Spouse Name: N/A  . Number of  Children: 3  . Years of Education: N/A   Occupational History  .  [employer]   Social History Main Topics  . Smoking status: Former Smoker -- 1.00 packs/day for 20 years    Types: Cigarettes  . Smokeless tobacco: Never Used     Comment: Quit 14 years ago  . Alcohol Use: No  . Drug Use: No  . Sexual Activity: Yes   Other Topics Concern  . Not on file   Social History Narrative   Lives with husband.     Outpatient Prescriptions Prior to Visit  Medication Sig Dispense Refill  . ALPRAZolam (XANAX) 0.25 MG tablet TAKE 1 TABLET BY MOUTH THREE TIMES DAILY AS NEEDED (Patient taking differently: TAKE 1 TABLET BY MOUTH THREE TIMES DAILY AS NEEDED FOR SLEEP) 60 tablet 0  . aspirin 81 MG tablet Take 81 mg by mouth daily.    . cetirizine (ZYRTEC) 10 MG tablet Take 10 mg by mouth daily.    Marland Kitchen levothyroxine (SYNTHROID, LEVOTHROID) 100 MCG tablet Take 1 tablet (100 mcg total) by mouth daily. 90 tablet 0  . mometasone (ELOCON) 0.1 % cream  APPLY TO THE AFFECTED AREA AS NEEDED (Patient taking differently: APPLY TO THE AFFECTED AREA AS NEEDED FOR DRY RASH) 45 g 1  . Omeprazole ([employer] OMEPRAZOLE) 20 MG TBEC Take 1 tablet by mouth daily.     . valACYclovir (VALTREX) 1000 MG tablet Take 1 tablet (1,000 mg total) by mouth 3 (three) times daily. (Patient not taking: Reported on 09/18/2015) 30 tablet 0  . valACYclovir (VALTREX) 500 MG tablet TAKE 1 TABLET BY MOUTH EVERY DAY AS NEEDED (Patient not taking: Reported on 09/18/2015) 30 tablet 2   No facility-administered medications prior to visit.    Allergies  Allergen Reactions  . Sulfamethoxazole Itching    Swelling around eyes  . Sulfonamide Derivatives     Review of Systems  Constitutional: Negative for fever, chills and malaise/fatigue.  HENT: Negative for congestion and hearing loss.   Eyes: Negative for discharge.  Respiratory: Negative for cough, sputum production and shortness of breath.   Cardiovascular: Negative for chest pain, palpitations and  leg swelling.  Gastrointestinal: Negative for heartburn, nausea, vomiting, abdominal pain, diarrhea, constipation and blood in stool.  Genitourinary: Negative for dysuria, urgency, frequency and hematuria.  Musculoskeletal: Negative for myalgias, back pain and falls.  Skin: Negative for rash.  Neurological: Negative for dizziness, sensory change, loss of consciousness, weakness and headaches.  Endo/Heme/Allergies: Negative for environmental allergies. Does not bruise/bleed easily.  Psychiatric/Behavioral: Negative for depression and suicidal ideas. The patient is not nervous/anxious and does not have insomnia.        Objective:    Physical Exam  Constitutional: She is oriented to person, place, and time. She appears well-developed and well-nourished.  HENT:  Head: Normocephalic and atraumatic.  Eyes: Conjunctivae and EOM are normal.  Neck: Normal range of motion. Neck supple. No JVD present. Carotid bruit is not present. No thyromegaly present.  Cardiovascular: Normal rate, regular rhythm and normal heart sounds.   No murmur heard. Pulmonary/Chest: Effort normal and breath sounds normal. No respiratory distress. She has no wheezes. She has no rales. She exhibits no tenderness.  Musculoskeletal: She exhibits no edema.  Neurological: She is alert and oriented to person, place, and time.  Psychiatric: She has a normal mood and affect. Her behavior is normal.  Nursing note and vitals reviewed.   BP 129/80 mmHg  Pulse 74  Temp(Src) 98.8 F (37.1 C) (Oral)  Ht 5\' 6"  (1.676 m)  Wt 201 lb (91.173 kg)  BMI 32.46 kg/m2  SpO2 100% Wt Readings from Last 3 Encounters:  09/18/15 201 lb (91.173 kg)  09/09/15 202 lb 9.6 oz (91.9 kg)  09/09/15 200 lb 3.2 oz (90.81 kg)     Lab Results  Component Value Date   WBC 10.4 09/10/2015   HGB 12.3 09/10/2015   HCT 36.6 09/10/2015   PLT 270 09/10/2015   GLUCOSE 191* 09/10/2015   CHOL 165 09/04/2015   TRIG 89.0 09/04/2015   HDL 54.80  09/04/2015   LDLCALC 92 09/04/2015   ALT 19 09/10/2015   AST 21 09/10/2015   NA 135 09/10/2015   K 4.0 09/10/2015   CL 105 09/10/2015   CREATININE 0.89 09/10/2015   BUN 11 09/10/2015   CO2 21* 09/10/2015   TSH 0.25* 09/04/2015   MICROALBUR 0.4 07/16/2012    Lab Results  Component Value Date   TSH 0.25* 09/04/2015   Lab Results  Component Value Date   WBC 10.4 09/10/2015   HGB 12.3 09/10/2015   HCT 36.6 09/10/2015   MCV 87.1 09/10/2015  PLT 270 09/10/2015   Lab Results  Component Value Date   NA 135 09/10/2015   K 4.0 09/10/2015   CO2 21* 09/10/2015   GLUCOSE 191* 09/10/2015   BUN 11 09/10/2015   CREATININE 0.89 09/10/2015   BILITOT 1.0 09/10/2015   ALKPHOS 85 09/10/2015   AST 21 09/10/2015   ALT 19 09/10/2015   PROT 6.9 09/10/2015   ALBUMIN 4.0 09/10/2015   CALCIUM 8.9 09/10/2015   ANIONGAP 9 09/10/2015   GFR 83.80 03/14/2014   Lab Results  Component Value Date   CHOL 165 09/04/2015   Lab Results  Component Value Date   HDL 54.80 09/04/2015   Lab Results  Component Value Date   LDLCALC 92 09/04/2015   Lab Results  Component Value Date   TRIG 89.0 09/04/2015   Lab Results  Component Value Date   CHOLHDL 3 09/04/2015   No results found for: HGBA1C     Assessment & Plan:   Problem List Items Addressed This Visit      Unprioritized   Meningitis - Primary    Viral  Symptoms have resolved F/u prn          I am having Ms. Mcmonigle maintain her Omeprazole, aspirin, mometasone, valACYclovir, cetirizine, ALPRAZolam, levothyroxine, and valACYclovir.  No orders of the defined types were placed in this encounter.     Ann Held, DO

## 2015-09-18 NOTE — Patient Instructions (Signed)
Viral Encephalitis Viral encephalitis is inflammation of the brain caused by infection with a virus. It can result in an illness with flulike symptoms. In some cases, the illness may be severe and require immediate treatment in a hospital. CAUSES Many viruses can cause viral encephalitis. These viruses can be spread in different ways, including:  The passing of the virus from person to person.  Contact with infected animals or insects. A bite from a mosquito or tick can pass certain types of viruses that may cause viral encephalitis. SIGNS AND SYMPTOMS Symptoms of viral encephalitis may vary. Common signs and symptoms include:  Fever.  Muscle aches.  Headache.  Eye pain caused by light.  Stiff neck.  Backache.  Confusion.  Unusual drowsiness.  Nausea and vomiting.  Difficulty speaking.  Irritability.  Clumsiness.  Uncontrolled stiffening, twitching, or jerking of your body (convulsions). DIAGNOSIS  Your health care provider will do a physical exam and take your medical history. Various tests may be done to help confirm the diagnosis. These tests can also help to determine which virus is causing the infection. Tests may include:  Blood tests.  A procedure that involves using a needle to take a sample of the fluid around your spinal cord (spinal tap).  Imaging studies of the brain, such as a CT scan or MRI. TREATMENT Some of the viruses that cause encephalitis can be treated with antiviral medicines. For other viruses, there is no specific treatment. Except in very mild cases, hospitalization is needed to treat symptoms and to watch for complications. Medicines may be given to treat convulsions, fever, and pain. Sometimes, antibiotic medicines are given for a day or two to be sure that the infection is not due to a bacterial germ. Intravenous fluids may be given if:  Vomiting has led to dehydration.  You cannot keep fluids down. HOME CARE INSTRUCTIONS  Rest in a  quiet room while you are recovering. Keep the room dark if you are sensitive to light.  Take medicines only as directed by your health care provider.  Drink enough fluid to keep your urine clear or pale yellow.  Take steps to avoid spreading the infection.  Wash your hands often.  Stay away from other people as much as possible during your recovery.  Keep all follow-up visits as directed by your health care provider. This is important. SEEK MEDICAL CARE IF:  You develop new symptoms.  You have a fever. SEEK IMMEDIATE MEDICAL CARE IF:  You have convulsions.  You faint or lose consciousness.  You have weakness in your arm or leg.  You have confusion or memory loss.  You have signs of dehydration, including:  Increased thirst.  Weakness.  Drowsiness.  Dizziness.  Decreased urination.   This information is not intended to replace advice given to you by your health care provider. Make sure you discuss any questions you have with your health care provider.   Document Released: 05/15/2007 Document Revised: 05/16/2014 Document Reviewed: 12/02/2013 Elsevier Interactive Patient Education [employer].

## 2015-09-18 NOTE — Progress Notes (Signed)
Pre visit review using our clinic tool,if applicable. No additional management support is needed unless otherwise documented below in the visit note.  

## 2015-09-18 NOTE — Assessment & Plan Note (Signed)
Viral  Symptoms have resolved F/u prn

## 2015-09-21 ENCOUNTER — Other Ambulatory Visit: Payer: Self-pay | Admitting: Family Medicine

## 2015-09-21 NOTE — Telephone Encounter (Signed)
Requesting Xanax 0.25mg -Take 1 tablet by mouth three times daily as needed. Last refill:08/25/15;#60,0 Last OV:09/18/15 UDS:09/04/15-Low risk-Next screen:03/05/16 Please advise.//AB/CMA

## 2015-09-22 NOTE — Telephone Encounter (Addendum)
Rx printed, signed and faxed to the pharmacy.  Confirmation received.//AB/CMA

## 2015-10-19 ENCOUNTER — Other Ambulatory Visit: Payer: Self-pay | Admitting: Family Medicine

## 2015-10-19 NOTE — Telephone Encounter (Signed)
Last seen 09/18/15 and filled 09/21/15 #60  Please advise    KP

## 2015-11-05 ENCOUNTER — Encounter: Payer: Self-pay | Admitting: Family Medicine

## 2015-11-16 ENCOUNTER — Other Ambulatory Visit: Payer: Self-pay | Admitting: Family Medicine

## 2015-11-16 NOTE — Telephone Encounter (Signed)
Last seen 09/18/15 and filled 10/19/15 #60   Please advise    KP

## 2015-12-04 ENCOUNTER — Other Ambulatory Visit: Payer: Self-pay | Admitting: Family Medicine

## 2015-12-07 ENCOUNTER — Other Ambulatory Visit: Payer: Self-pay

## 2015-12-07 DIAGNOSIS — E0590 Thyrotoxicosis, unspecified without thyrotoxic crisis or storm: Secondary | ICD-10-CM

## 2015-12-08 ENCOUNTER — Other Ambulatory Visit (INDEPENDENT_AMBULATORY_CARE_PROVIDER_SITE_OTHER): Payer: BC Managed Care – PPO

## 2015-12-08 DIAGNOSIS — E0590 Thyrotoxicosis, unspecified without thyrotoxic crisis or storm: Secondary | ICD-10-CM

## 2015-12-08 LAB — TSH: TSH: 1.09 u[IU]/mL (ref 0.35–4.50)

## 2015-12-15 ENCOUNTER — Other Ambulatory Visit: Payer: Self-pay | Admitting: Family Medicine

## 2015-12-15 NOTE — Telephone Encounter (Signed)
Last Rx was 11/16/15,#60  UDS 09/04/15, low risk Next OV 08/2016 Rx printed and forwarded to PCP for signature.

## 2015-12-28 ENCOUNTER — Encounter: Payer: Self-pay | Admitting: Family Medicine

## 2015-12-28 MED ORDER — LEVOTHYROXINE SODIUM 100 MCG PO TABS: 100.0000 ug | ORAL_TABLET | Freq: Every day | ORAL | 5 refills | Status: DC

## 2016-01-11 ENCOUNTER — Other Ambulatory Visit: Payer: Self-pay | Admitting: Family Medicine

## 2016-01-12 NOTE — Telephone Encounter (Signed)
Last seen 09/18/15 and filled 12/15/15 #60  Please advise    KP

## 2016-01-12 NOTE — Telephone Encounter (Signed)
Rx faxed.    KP 

## 2016-02-10 ENCOUNTER — Other Ambulatory Visit: Payer: Self-pay | Admitting: Family Medicine

## 2016-02-10 NOTE — Telephone Encounter (Signed)
Last seen 09/18/15 and filled 01/12/2016 #60   Please advise     KP

## 2016-02-11 MED ORDER — ALPRAZOLAM 0.25 MG PO TABS: 0.2500 mg | ORAL_TABLET | Freq: Three times a day (TID) | ORAL | 0 refills | Status: DC | PRN

## 2016-02-11 NOTE — Addendum Note (Signed)
Addended by: Ewing Schlein on: 02/11/2016 10:53 AM   Modules accepted: Orders

## 2016-03-09 ENCOUNTER — Other Ambulatory Visit: Payer: Self-pay | Admitting: Family Medicine

## 2016-03-09 NOTE — Telephone Encounter (Signed)
Last alprazolam Rx: 02/11/15, #60 Last OV: 09/18/15 Next OV: 09/01/16 UDS: 09/04/15 and was due 03/05/16  Rx printed and forwarded to PCP for signature. Pt will need to pick up Rx and provide UDS at that time.

## 2016-05-03 ENCOUNTER — Other Ambulatory Visit: Payer: Self-pay | Admitting: Family Medicine

## 2016-05-03 NOTE — Telephone Encounter (Signed)
Last ov 09/18/15. Last 03/09/16 #60 0. Please advise. LB

## 2016-05-04 NOTE — Telephone Encounter (Signed)
Confirmation fax received. LB

## 2016-05-04 NOTE — Telephone Encounter (Signed)
Faxed Rx to pharmacy. LB 

## 2016-05-31 ENCOUNTER — Other Ambulatory Visit: Payer: Self-pay | Admitting: Family Medicine

## 2016-05-31 DIAGNOSIS — Z853 Personal history of malignant neoplasm of breast: Secondary | ICD-10-CM

## 2016-05-31 NOTE — Telephone Encounter (Signed)
Faxed hardcopy for alprazolam to [facility] in [place] 

## 2016-05-31 NOTE — Telephone Encounter (Signed)
Requesting:  alprazolam Contract   05/16/2014 UDS   Low risk Last OV   09/18/2015 Last Refill   #60 with 0 refills on 05/03/2016  Please Advise

## 2016-06-17 ENCOUNTER — Encounter: Payer: Self-pay | Admitting: Family Medicine

## 2016-06-18 ENCOUNTER — Other Ambulatory Visit: Payer: Self-pay | Admitting: Family Medicine

## 2016-06-20 NOTE — Telephone Encounter (Signed)
Please put her on waitning list

## 2016-06-23 ENCOUNTER — Encounter: Payer: Self-pay | Admitting: Family Medicine

## 2016-06-23 ENCOUNTER — Ambulatory Visit (INDEPENDENT_AMBULATORY_CARE_PROVIDER_SITE_OTHER): Payer: BC Managed Care – PPO | Admitting: Family Medicine

## 2016-06-23 VITALS — BP 142/80 | HR 71 | Temp 98.6°F | Resp 16 | Ht 66.0 in | Wt 202.6 lb

## 2016-06-23 DIAGNOSIS — L03012 Cellulitis of left finger: Secondary | ICD-10-CM | POA: Diagnosis not present

## 2016-06-23 DIAGNOSIS — F419 Anxiety disorder, unspecified: Secondary | ICD-10-CM

## 2016-06-23 DIAGNOSIS — F418 Other specified anxiety disorders: Secondary | ICD-10-CM | POA: Diagnosis not present

## 2016-06-23 DIAGNOSIS — F339 Major depressive disorder, recurrent, unspecified: Secondary | ICD-10-CM | POA: Diagnosis not present

## 2016-06-23 DIAGNOSIS — F329 Major depressive disorder, single episode, unspecified: Secondary | ICD-10-CM

## 2016-06-23 MED ORDER — FLUOXETINE HCL 20 MG PO TABS
20.0000 mg | ORAL_TABLET | Freq: Every day | ORAL | 3 refills | Status: DC
Start: 2016-06-23 — End: 2016-12-27

## 2016-06-23 MED ORDER — CEPHALEXIN 500 MG PO CAPS: 500.0000 mg | ORAL_CAPSULE | Freq: Two times a day (BID) | ORAL | 0 refills | Status: DC

## 2016-06-23 MED ORDER — ALPRAZOLAM 0.25 MG PO TABS
0.2500 mg | ORAL_TABLET | Freq: Three times a day (TID) | ORAL | 0 refills | Status: DC | PRN
Start: 2016-06-23 — End: 2016-07-25

## 2016-06-23 NOTE — Progress Notes (Signed)
Subjective:    Patient ID: Norma Kelley, female    DOB: Apr 20, 1961, 56 y.o.   MRN: [id number]  I acted as a Education administrator for Dr. Royden Purl, LPN    Chief Complaint  Patient presents with  . Depression    Depression         This is a recurrent problem.  The current episode started more than 1 year ago.   The problem occurs constantly.  Associated symptoms include decreased concentration, fatigue, hopelessness and restlessness.  Associated symptoms include no headaches.     The symptoms are aggravated by family issues.  Compliance with treatment is good.   Patient is in today for depression. Patient report she's has experienced depression and after a while came off the medication but want to start back on the depression med. Patient also c/o left middle finger pain, redness, and swollen for a couple weeks.  Past Medical History:  Diagnosis Date  . Anxiety   . Breast cancer ([place]) 2007   left breast cancer  . GERD (gastroesophageal reflux disease)   . Hypothyroidism   . Thyroid disease     Past Surgical History:  Procedure Laterality Date  . ABDOMINAL HYSTERECTOMY    . BACK SURGERY  2013   lumbar  . BREAST LUMPECTOMY WITH RADIOACTIVE SEED LOCALIZATION Right 08/17/2015   Procedure: BREAST LUMPECTOMY WITH RADIOACTIVE SEED LOCALIZATION;  Surgeon: Excell Seltzer, MD;  Location: [facility];  Service: General;  Laterality: Right;  . BREAST SURGERY     Mastectomy and reconstruction  . MASTECTOMY Left 2007   mastectomy and tamoxifen  . TONSILLECTOMY      Family History  Problem Relation Age of Onset  . Hyperlipidemia Mother   . Osteoporosis Mother   . Diabetes Mother   . Heart attack Mother 60  . Heart attack Father 22    MASSIVE MI,  Died age 57  . Colon cancer Maternal Grandfather     dx in late 60s-early 70s  . Pancreatic cancer Neg Hx   . Stomach cancer Neg Hx   . Rectal cancer Neg Hx   . Head & neck cancer Maternal Grandmother     cancer of the  sinuses    Social History   Social History  . Marital status: Married    Spouse name: N/A  . Number of children: 3  . Years of education: N/A   Occupational History  .  [employer]   Social History Main Topics  . Smoking status: Former Smoker    Packs/day: 1.00    Years: 20.00    Types: Cigarettes  . Smokeless tobacco: Never Used     Comment: Quit 14 years ago  . Alcohol use No  . Drug use: No  . Sexual activity: Yes   Other Topics Concern  . Not on file   Social History Narrative   Lives with husband.     Outpatient Medications Prior to Visit  Medication Sig Dispense Refill  . aspirin 81 MG tablet Take 81 mg by mouth daily.    . cetirizine (ZYRTEC) 10 MG tablet Take 10 mg by mouth daily.    Marland Kitchen levothyroxine (SYNTHROID, LEVOTHROID) 100 MCG tablet Take 1 tablet (100 mcg total) by mouth daily. 30 tablet 5  . mometasone (ELOCON) 0.1 % cream APPLY TO THE AFFECTED AREA AS NEEDED (Patient taking differently: APPLY TO THE AFFECTED AREA AS NEEDED FOR DRY RASH) 45 g 1  . Omeprazole ([facility] OMEPRAZOLE) 20 MG TBEC Take  1 tablet by mouth daily.     . valACYclovir (VALTREX) 500 MG tablet TAKE 1 TABLET BY MOUTH EVERY DAY AS NEEDED 30 tablet 0  . ALPRAZolam (XANAX) 0.25 MG tablet TAKE 1 TABLET BY MOUTH THREE TIMES DAILY AS NEEDED 60 tablet 0   No facility-administered medications prior to visit.     Allergies  Allergen Reactions  . Sulfamethoxazole Itching    Swelling around eyes  . Sulfonamide Derivatives     Review of Systems  Constitutional: Positive for fatigue. Negative for fever.  HENT: Negative for congestion.   Eyes: Negative for blurred vision.  Respiratory: Negative for cough.   Cardiovascular: Negative for chest pain and palpitations.  Gastrointestinal: Negative for vomiting.  Musculoskeletal: Negative for back pain.  Skin: Negative for rash.  Neurological: Negative for loss of consciousness and headaches.  Psychiatric/Behavioral: Positive for decreased concentration  and depression.       Objective:    Physical Exam  Constitutional: She is oriented to person, place, and time. She appears well-developed and well-nourished. No distress.  HENT:  Head: Normocephalic and atraumatic.  Eyes: Conjunctivae are normal. Pupils are equal, round, and reactive to light.  Neck: Normal range of motion. No thyromegaly present.  Cardiovascular: Normal rate and regular rhythm.   Pulmonary/Chest: Effort normal and breath sounds normal. She has no wheezes.  Abdominal: Soft. Bowel sounds are normal. There is no tenderness.  Musculoskeletal: Normal range of motion. She exhibits no edema or deformity.  Neurological: She is alert and oriented to person, place, and time.  Skin: Skin is warm and dry. She is not diaphoretic. There is erythema.  Paronychia-- L middle finger  Psychiatric: She has a normal mood and affect.    BP (!) 142/80 (BP Location: Right Arm, Patient Position: Sitting, Cuff Size: Normal)   Pulse 71   Temp 98.6 F (37 C) (Oral)   Resp 16   Ht 5\' 6"  (1.676 m)   Wt 202 lb 9.6 oz (91.9 kg)   SpO2 97%   BMI 32.70 kg/m  Wt Readings from Last 3 Encounters:  06/23/16 202 lb 9.6 oz (91.9 kg)  09/18/15 201 lb (91.2 kg)  09/09/15 202 lb 9.6 oz (91.9 kg)     Lab Results  Component Value Date   WBC 10.4 09/10/2015   HGB 12.3 09/10/2015   HCT 36.6 09/10/2015   PLT 270 09/10/2015   GLUCOSE 191 (H) 09/10/2015   CHOL 165 09/04/2015   TRIG 89.0 09/04/2015   HDL 54.80 09/04/2015   LDLCALC 92 09/04/2015   ALT 19 09/10/2015   AST 21 09/10/2015   NA 135 09/10/2015   K 4.0 09/10/2015   CL 105 09/10/2015   CREATININE 0.89 09/10/2015   BUN 11 09/10/2015   CO2 21 (L) 09/10/2015   TSH 1.09 12/08/2015   MICROALBUR 0.4 07/16/2012    Lab Results  Component Value Date   TSH 1.09 12/08/2015   Lab Results  Component Value Date   WBC 10.4 09/10/2015   HGB 12.3 09/10/2015   HCT 36.6 09/10/2015   MCV 87.1 09/10/2015   PLT 270 09/10/2015   Lab Results   Component Value Date   NA 135 09/10/2015   K 4.0 09/10/2015   CO2 21 (L) 09/10/2015   GLUCOSE 191 (H) 09/10/2015   BUN 11 09/10/2015   CREATININE 0.89 09/10/2015   BILITOT 1.0 09/10/2015   ALKPHOS 85 09/10/2015   AST 21 09/10/2015   ALT 19 09/10/2015   PROT 6.9 09/10/2015  ALBUMIN 4.0 09/10/2015   CALCIUM 8.9 09/10/2015   ANIONGAP 9 09/10/2015   GFR 83.80 03/14/2014   Lab Results  Component Value Date   CHOL 165 09/04/2015   Lab Results  Component Value Date   HDL 54.80 09/04/2015   Lab Results  Component Value Date   LDLCALC 92 09/04/2015   Lab Results  Component Value Date   TRIG 89.0 09/04/2015   Lab Results  Component Value Date   CHOLHDL 3 09/04/2015   No results found for: HGBA1C     Assessment & Plan:   Problem List Items Addressed This Visit      Unprioritized   Paronychia of left middle finger    Start Keflex per orders Call prn        Other Visit Diagnoses    Depression, recurrent ([facility])    -  Primary   Relevant Medications   FLUoxetine (PROZAC) 20 MG tablet   ALPRAZolam (XANAX) 0.25 MG tablet   Anxiety and depression       Relevant Medications   ALPRAZolam (XANAX) 0.25 MG tablet      I have changed Ms. Fess's ALPRAZolam. I am also having her start on FLUoxetine and cephALEXin. Additionally, I am having her maintain her Omeprazole, aspirin, mometasone, cetirizine, levothyroxine, and valACYclovir.  Meds ordered this encounter  Medications  . FLUoxetine (PROZAC) 20 MG tablet    Sig: Take 1 tablet (20 mg total) by mouth daily.    Dispense:  30 tablet    Refill:  3  . ALPRAZolam (XANAX) 0.25 MG tablet    Sig: Take 1 tablet (0.25 mg total) by mouth 3 (three) times daily as needed.    Dispense:  60 tablet    Refill:  0  . cephALEXin (KEFLEX) 500 MG capsule    Sig: Take 1 capsule (500 mg total) by mouth 2 (two) times daily.    Dispense:  20 capsule    Refill:  0    CMA served as scribe during this visit. History, Physical and Plan  performed by medical provider. Documentation and orders reviewed and attested to.  Ann Held, DO Patient ID: Norma Kelley, female   DOB: [birth year], 56 y.o.   MRN: [id number]

## 2016-06-23 NOTE — Patient Instructions (Signed)
Major Depressive Disorder, Adult Major depressive disorder (MDD) is a mental health condition. MDD often makes you feel sad, hopeless, or helpless. MDD can also cause symptoms in your body. MDD can affect your:  Work.  School.  Relationships.  Other normal activities. MDD can range from mild to very bad. It may occur once (single episode MDD). It can also occur many times (recurrent MDD). The main symptoms of MDD often include:  Feeling sad, depressed, or irritable most of the time.  Loss of interest. MDD symptoms also include:  Sleeping too much or too little.  Eating too much or too little.  A change in your weight.  Feeling tired (fatigue) or having low energy.  Feeling worthless.  Feeling guilty.  Trouble making decisions.  Trouble thinking clearly.  Thoughts of suicide or harming others.  Feeling weak.  Feeling agitated.  Keeping yourself from being around other people (isolation). Follow these instructions at home: Activity  Do these things as told by your doctor:  Go back to your normal activities.  Exercise regularly.  Spend time outdoors. Alcohol  Talk with your doctor about how alcohol can affect your antidepressant medicines.  Do not drink alcohol. Or, limit how much alcohol you drink.  This means no more than 1 drink a day for nonpregnant women and 2 drinks a day for men. One drink equals one of these:  12 oz of beer.  5 oz of wine.  1 oz of hard liquor. General instructions  Take over-the-counter and prescription medicines only as told by your doctor.  Eat a healthy diet.  Get plenty of sleep.  Find activities that you enjoy. Make time to do them.  Think about joining a support group. Your doctor may be able to suggest a group for you.  Keep all follow-up visits as told by your doctor. This is important. Where to find more information:  [employer] on Mental Illness:  www.nami.org  U.S. National Institute of Mental  Health:  https://carter.com/  National Suicide Prevention Lifeline:  561-470-9924. This is free, 24-hour help. Contact a doctor if:  Your symptoms get worse.  You have new symptoms. Get help right away if:  You self-harm.  You see, hear, taste, smell, or feel things that are not present (hallucinate). If you ever feel like you may hurt yourself or others, or have thoughts about taking your own life, get help right away. You can go to your nearest emergency department or call:  Your local emergency services (911 in the [place]  A suicide crisis helpline, such as the [place]:  803-800-9968. This is open 24 hours a day. This information is not intended to replace advice given to you by your health care provider. Make sure you discuss any questions you have with your health care provider. Document Released: 04/06/2015 Document Revised: 01/10/2016 Document Reviewed: 01/10/2016 Elsevier Interactive Patient Education  2017 [place] is an infection of the skin that surrounds a nail. It usually affects the skin around a fingernail, but it may also occur near a toenail. It often causes pain and swelling around the nail. This condition may come on suddenly or develop over a longer period. In some cases, a collection of pus (abscess) can form near or under the nail. Usually, paronychia is not serious and it clears up with treatment. What are the causes? This condition may be caused by bacteria or fungi. It is commonly caused by either Streptococcus or Staphylococcus bacteria.  The bacteria or fungi often cause the infection by getting into the affected area through an opening in the skin, such as a cut or a hangnail. What increases the risk? This condition is more likely to develop in:  People who get their hands wet often, such as those who work as Designer, industrial/product, bartenders, or nurses.  People who bite their fingernails  or suck their thumbs.  People who trim their nails too short.  People who have hangnails or injured fingertips.  People who get manicures.  People who have diabetes. What are the signs or symptoms? Symptoms of this condition include:  Redness and swelling of the skin near the nail.  Tenderness around the nail when you touch the area.  Pus-filled bumps under the cuticle. The cuticle is the skin at the base or sides of the nail.  Fluid or pus under the nail.  Throbbing pain in the area. How is this diagnosed? This condition is usually diagnosed with a physical exam. In some cases, a sample of pus may be taken from an abscess to be tested in a lab. This can help to determine what type of bacteria or fungi is causing the condition. How is this treated? Treatment for this condition depends on the cause and severity of the condition. If the condition is mild, it may clear up on its own in a few days. Your health care provider may recommend soaking the affected area in warm water a few times a day. When treatment is needed, the options may include:  Antibiotic medicine, if the condition is caused by a bacterial infection.  Antifungal medicine, if the condition is caused by a fungal infection.  Incision and drainage, if an abscess is present. In this procedure, the health care provider will cut open the abscess so the pus can drain out. Follow these instructions at home:  Soak the affected area in warm water if directed to do so by your health care provider. You may be told to do this for 20 minutes, 2-3 times a day. Keep the area dry in between soakings.  Take medicines only as directed by your health care provider.  If you were prescribed an antibiotic medicine, finish all of it even if you start to feel better.  Keep the affected area clean.  Do not try to drain a fluid-filled bump yourself.  If you will be washing dishes or performing other tasks that require your hands to get  wet, wear rubber gloves. You should also wear gloves if your hands might come in contact with irritating substances, such as cleaners or chemicals.  Follow your health care provider's instructions about:  Wound care.  Bandage (dressing) changes and removal. Contact a health care provider if:  Your symptoms get worse or do not improve with treatment.  You have a fever or chills.  You have redness spreading from the affected area.  You have continued or increased fluid, blood, or pus coming from the affected area.  Your finger or knuckle becomes swollen or is difficult to move. This information is not intended to replace advice given to you by your health care provider. Make sure you discuss any questions you have with your health care provider. Document Released: 10/19/2000 Document Revised: 10/01/2015 Document Reviewed: 04/02/2014  2017 Elsevier

## 2016-06-23 NOTE — Progress Notes (Signed)
Pre visit review using our clinic review tool, if applicable. No additional management support is needed unless otherwise documented below in the visit note. 

## 2016-06-26 DIAGNOSIS — L03012 Cellulitis of left finger: Secondary | ICD-10-CM | POA: Insufficient documentation

## 2016-06-26 NOTE — Assessment & Plan Note (Signed)
Start Keflex per orders Call prn

## 2016-06-27 ENCOUNTER — Ambulatory Visit: Payer: BC Managed Care – PPO | Admitting: Family Medicine

## 2016-07-01 ENCOUNTER — Other Ambulatory Visit: Payer: Self-pay | Admitting: Family Medicine

## 2016-07-11 ENCOUNTER — Ambulatory Visit
Admission: RE | Admit: 2016-07-11 | Discharge: 2016-07-11 | Disposition: A | Discharge status: Home / Self Care | Payer: BC Managed Care – PPO | Source: Ambulatory Visit | Attending: Family Medicine | Admitting: Family Medicine

## 2016-07-11 ENCOUNTER — Other Ambulatory Visit: Payer: Self-pay | Admitting: Family Medicine

## 2016-07-11 DIAGNOSIS — Z1231 Encounter for screening mammogram for malignant neoplasm of breast: Secondary | ICD-10-CM

## 2016-07-11 DIAGNOSIS — Z853 Personal history of malignant neoplasm of breast: Secondary | ICD-10-CM

## 2016-07-15 ENCOUNTER — Ambulatory Visit: Payer: Self-pay | Admitting: Family Medicine

## 2016-07-25 ENCOUNTER — Encounter: Payer: Self-pay | Admitting: Family Medicine

## 2016-07-25 ENCOUNTER — Other Ambulatory Visit: Payer: Self-pay | Admitting: Family Medicine

## 2016-07-25 DIAGNOSIS — F419 Anxiety disorder, unspecified: Principal | ICD-10-CM

## 2016-07-25 DIAGNOSIS — F329 Major depressive disorder, single episode, unspecified: Secondary | ICD-10-CM

## 2016-07-25 NOTE — Telephone Encounter (Signed)
Faxed hardcopy for alprazolm to [facility]

## 2016-07-25 NOTE — Telephone Encounter (Signed)
Glad its helping

## 2016-07-25 NOTE — Telephone Encounter (Signed)
Requesting:  alprazolam Contract  none UDS   Low risk--overdue Last OV    06/23/16---next scheduled appt is on 09/01/16 Last Refill    #60 no refills on 06/23/16  Please Advise

## 2016-08-23 ENCOUNTER — Other Ambulatory Visit: Payer: Self-pay | Admitting: Family Medicine

## 2016-08-23 DIAGNOSIS — F329 Major depressive disorder, single episode, unspecified: Secondary | ICD-10-CM

## 2016-08-23 DIAGNOSIS — F32A Depression, unspecified: Secondary | ICD-10-CM

## 2016-08-23 DIAGNOSIS — F419 Anxiety disorder, unspecified: Principal | ICD-10-CM

## 2016-08-23 NOTE — Telephone Encounter (Signed)
Requesting:    alprazolam Contract    1.8.2016 UDS   Low risk was due to be redone on 03/05/2016 Last OV    07/03/2016-----future appt 09/01/2016 Last Refill     #60 no refills on 07/25/2016  Please Advise

## 2016-08-24 NOTE — Telephone Encounter (Signed)
Once PCP signs will fax to [facility] in [place].

## 2016-09-01 ENCOUNTER — Ambulatory Visit (HOSPITAL_BASED_OUTPATIENT_CLINIC_OR_DEPARTMENT_OTHER)
Admission: RE | Admit: 2016-09-01 | Discharge: 2016-09-01 | Disposition: A | Discharge status: Home / Self Care | Payer: BC Managed Care – PPO | Source: Ambulatory Visit | Attending: Family Medicine | Admitting: Family Medicine

## 2016-09-01 ENCOUNTER — Encounter: Payer: Self-pay | Admitting: Family Medicine

## 2016-09-01 ENCOUNTER — Ambulatory Visit (INDEPENDENT_AMBULATORY_CARE_PROVIDER_SITE_OTHER): Payer: BC Managed Care – PPO | Admitting: Family Medicine

## 2016-09-01 VITALS — BP 106/80 | HR 75 | Temp 98.0°F | Resp 16 | Ht 66.0 in | Wt 202.0 lb

## 2016-09-01 DIAGNOSIS — Z8041 Family history of malignant neoplasm of ovary: Secondary | ICD-10-CM | POA: Diagnosis not present

## 2016-09-01 DIAGNOSIS — Z0000 Encounter for general adult medical examination without abnormal findings: Secondary | ICD-10-CM | POA: Diagnosis not present

## 2016-09-01 DIAGNOSIS — Z90710 Acquired absence of both cervix and uterus: Secondary | ICD-10-CM | POA: Insufficient documentation

## 2016-09-01 DIAGNOSIS — E039 Hypothyroidism, unspecified: Secondary | ICD-10-CM

## 2016-09-01 DIAGNOSIS — R140 Abdominal distension (gaseous): Secondary | ICD-10-CM

## 2016-09-01 LAB — CBC WITH DIFFERENTIAL/PLATELET
Basophils Absolute: 0 10*3/uL (ref 0.0–0.1)
Basophils Relative: 0.5 % (ref 0.0–3.0)
EOS ABS: 0.1 10*3/uL (ref 0.0–0.7)
Eosinophils Relative: 1.5 % (ref 0.0–5.0)
HEMATOCRIT: 41.3 % (ref 36.0–46.0)
HEMOGLOBIN: 13.7 g/dL (ref 12.0–15.0)
LYMPHS PCT: 25.8 % (ref 12.0–46.0)
Lymphs Abs: 2 10*3/uL (ref 0.7–4.0)
MCHC: 33.1 g/dL (ref 30.0–36.0)
MCV: 87.1 fl (ref 78.0–100.0)
Monocytes Absolute: 0.4 10*3/uL (ref 0.1–1.0)
Monocytes Relative: 5.5 % (ref 3.0–12.0)
Neutro Abs: 5.2 10*3/uL (ref 1.4–7.7)
Neutrophils Relative %: 66.7 % (ref 43.0–77.0)
Platelets: 300 10*3/uL (ref 150.0–400.0)
RBC: 4.74 Mil/uL (ref 3.87–5.11)
RDW: 13.3 % (ref 11.5–15.5)
WBC: 7.8 10*3/uL (ref 4.0–10.5)

## 2016-09-01 LAB — COMPREHENSIVE METABOLIC PANEL
ALBUMIN: 4.8 g/dL (ref 3.5–5.2)
ALT: 22 U/L (ref 0–35)
AST: 22 U/L (ref 0–37)
Alkaline Phosphatase: 84 U/L (ref 39–117)
BUN: 14 mg/dL (ref 6–23)
CALCIUM: 10.1 mg/dL (ref 8.4–10.5)
CHLORIDE: 107 meq/L (ref 96–112)
CO2: 29 mEq/L (ref 19–32)
CREATININE: 0.88 mg/dL (ref 0.40–1.20)
GFR: 70.14 mL/min (ref >60.00)
Glucose, Bld: 102 mg/dL — ABNORMAL HIGH (ref 70–99)
Potassium: 4.8 mEq/L (ref 3.5–5.1)
Sodium: 142 mEq/L (ref 135–145)
Total Bilirubin: 0.7 mg/dL (ref 0.2–1.2)
Total Protein: 7.5 g/dL (ref 6.0–8.3)

## 2016-09-01 LAB — LIPID PANEL
CHOL/HDL RATIO: 3
Cholesterol: 161 mg/dL (ref 0–200)
HDL: 57.4 mg/dL (ref >39.00)
LDL CALC: 93 mg/dL (ref 0–99)
NonHDL: 103.33
TRIGLYCERIDES: 54 mg/dL (ref 0.0–149.0)
VLDL: 10.8 mg/dL (ref 0.0–40.0)

## 2016-09-01 LAB — POC URINALSYSI DIPSTICK (AUTOMATED)
Bilirubin, UA: NEGATIVE
Glucose, UA: NEGATIVE
KETONES UA: NEGATIVE
Leukocytes, UA: NEGATIVE
Nitrite, UA: NEGATIVE
PROTEIN UA: NEGATIVE
RBC UA: NEGATIVE
SPEC GRAV UA: 1.025 (ref 1.010–1.025)
Urobilinogen, UA: 0.2 E.U./dL
pH, UA: 6 (ref 5.0–8.0)

## 2016-09-01 LAB — TSH: TSH: 2.66 u[IU]/mL (ref 0.35–4.50)

## 2016-09-01 MED ORDER — LIRAGLUTIDE -WEIGHT MANAGEMENT 18 MG/3ML ~~LOC~~ SOPN: 3.0000 mg | PEN_INJECTOR | Freq: Every day | SUBCUTANEOUS | 3 refills | Status: DC

## 2016-09-01 MED ORDER — INSULIN PEN NEEDLE 32G X 4 MM MISC: 2 refills | Status: DC

## 2016-09-01 NOTE — Patient Instructions (Signed)
Saxenda.    0.66m daily for 1 week 1.234mdaily for 1 week 1.68m30maily for 1 week 2.4mg84mily for 1 week 3mg 69mly thereafter   Preventive Care 40-64 Years, Female Preventive care refers to lifestyle choices and visits with your health care provider that can promote health and wellness. What does preventive care include?  A yearly physical exam. This is also called an annual well check.  Dental exams once or twice a year.  Routine eye exams. Ask your health care provider how often you should have your eyes checked.  Personal lifestyle choices, including:  Daily care of your teeth and gums.  Regular physical activity.  Eating a healthy diet.  Avoiding tobacco and drug use.  Limiting alcohol use.  Practicing safe sex.  Taking low-dose aspirin daily starting at age 68.  1king vitamin and mineral supplements as recommended by your health care provider. What happens during an annual well check? The services and screenings done by your health care provider during your annual well check will depend on your age, overall health, lifestyle risk factors, and family history of disease. Counseling  Your health care provider may ask you questions about your:  Alcohol use.  Tobacco use.  Drug use.  Emotional well-being.  Home and relationship well-being.  Sexual activity.  Eating habits.  Work and work envirStatisticianthod of birth control.  Menstrual cycle.  Pregnancy history. Screening  You may have the following tests or measurements:  Height, weight, and BMI.  Blood pressure.  Lipid and cholesterol levels. These may be checked every 5 years, or more frequently if you are over 50 ye6s old.  Skin check.  Lung cancer screening. You may have this screening every year starting at age 51 if20ou have a 30-pack-year history of smoking and currently smoke or have quit within the past 15 years.  Fecal occult blood test (FOBT) of the stool. You may have this test  every year starting at age 68.  14exible sigmoidoscopy or colonoscopy. You may have a sigmoidoscopy every 5 years or a colonoscopy every 10 years starting at age 68.  22patitis C blood test.  Hepatitis B blood test.  Sexually transmitted disease (STD) testing.  Diabetes screening. This is done by checking your blood sugar (glucose) after you have not eaten for a while (fasting). You may have this done every 1-3 years.  Mammogram. This may be done every 1-2 years. Talk to your health care provider about when you should start having regular mammograms. This may depend on whether you have a family history of breast cancer.  BRCA-related cancer screening. This may be done if you have a family history of breast, ovarian, tubal, or peritoneal cancers.  Pelvic exam and Pap test. This may be done every 3 years starting at age 82. S57rting at age 30, t30s may be done every 5 years if you have a Pap test in combination with an HPV test.  Bone density scan. This is done to screen for osteoporosis. You may have this scan if you are at high risk for osteoporosis. Discuss your test results, treatment options, and if necessary, the need for more tests with your health care provider. Vaccines  Your health care provider may recommend certain vaccines, such as:  Influenza vaccine. This is recommended every year.  Tetanus, diphtheria, and acellular pertussis (Tdap, Td) vaccine. You may need a Td booster every 10 years.  Varicella vaccine. You may need this if you have not been vaccinated.  Zoster vaccine. You may need this after age 62.  Measles, mumps, and rubella (MMR) vaccine. You may need at least one dose of MMR if you were born in 1957 or later. You may also need a second dose.  Pneumococcal 13-valent conjugate (PCV13) vaccine. You may need this if you have certain conditions and were not previously vaccinated.  Pneumococcal polysaccharide (PPSV23) vaccine. You may need one or two doses if you  smoke cigarettes or if you have certain conditions.  Meningococcal vaccine. You may need this if you have certain conditions.  Hepatitis A vaccine. You may need this if you have certain conditions or if you travel or work in places where you may be exposed to hepatitis A.  Hepatitis B vaccine. You may need this if you have certain conditions or if you travel or work in places where you may be exposed to hepatitis B.  Haemophilus influenzae type b (Hib) vaccine. You may need this if you have certain conditions. Talk to your health care provider about which screenings and vaccines you need and how often you need them. This information is not intended to replace advice given to you by your health care provider. Make sure you discuss any questions you have with your health care provider. Document Released: 05/22/2015 Document Revised: 01/13/2016 Document Reviewed: 02/24/2015 Elsevier Interactive Patient Education  2017 [employer].

## 2016-09-01 NOTE — Progress Notes (Signed)
Patient ID: Norma Kelley, female   DOB: Jan 15, 1961, 56 y.o.   MRN: [id number]   I acted as a Education administrator for Dr. Carollee Herter.  Guerry Bruin, CMA  Subjective:     Norma Kelley is a 56 y.o. female and is here for a comprehensive physical exam. The patient reports that she would like to talk to you about starting the fluoxetine for hot flashes.  She never started it because she wanted to try and herbal remedy that seems like it is not working.  Social History   Social History  . Marital status: Married    Spouse name: N/A  . Number of children: 3  . Years of education: N/A   Occupational History  .  [employer]   Social History Main Topics  . Smoking status: Former Smoker    Packs/day: 1.00    Years: 20.00    Types: Cigarettes  . Smokeless tobacco: Never Used     Comment: Quit 14 years ago  . Alcohol use No  . Drug use: No  . Sexual activity: Yes   Other Topics Concern  . Not on file   Social History Narrative   Lives with husband.  Occasional exercise.   Health Maintenance  Topic Date Due  . INFLUENZA VACCINE  12/07/2016  . MAMMOGRAM  07/12/2018  . COLONOSCOPY  11/15/2018  . TETANUS/TDAP  12/21/2022  . Hepatitis C Screening  Completed  . HIV Screening  Completed    The following portions of the patient's history were reviewed and updated as appropriate:  She  has a past medical history of Anxiety; Breast cancer ([place]) (2007); GERD (gastroesophageal reflux disease); Hypothyroidism; and Thyroid disease. She  does not have any pertinent problems on file. She  has a past surgical history that includes Abdominal hysterectomy; Tonsillectomy; Breast surgery; Mastectomy (Left, 2007); Back surgery (2013); and Breast lumpectomy with radioactive seed localization (Right, 08/17/2015). Her family history includes Colon cancer in her maternal grandfather; Diabetes in her mother; Head & neck cancer in her maternal grandmother; Heart attack (age of onset: 22) in her father; Heart attack (age of  onset: 67) in her mother; Hyperlipidemia in her mother; Osteoporosis in her mother. She  reports that she has quit smoking. Her smoking use included Cigarettes. She has a 20.00 pack-year smoking history. She has never used smokeless tobacco. She reports that she does not drink alcohol or use drugs. She has a current medication list which includes the following prescription(s): alprazolam, aspirin, cetirizine, levothyroxine, mometasone, omeprazole, valacyclovir, desonide, and fluoxetine. Current Outpatient Prescriptions on File Prior to Visit  Medication Sig Dispense Refill  . ALPRAZolam (XANAX) 0.25 MG tablet TAKE 1 TABLET BY MOUTH THREE TIMES DAILY AS NEEDED 60 tablet 0  . aspirin 81 MG tablet Take 81 mg by mouth daily.    . cetirizine (ZYRTEC) 10 MG tablet Take 10 mg by mouth daily.    Marland Kitchen levothyroxine (SYNTHROID, LEVOTHROID) 100 MCG tablet TAKE 1 TABLET(100 MCG) BY MOUTH DAILY 30 tablet 4  . mometasone (ELOCON) 0.1 % cream APPLY TO THE AFFECTED AREA AS NEEDED (Patient taking differently: APPLY TO THE AFFECTED AREA AS NEEDED FOR DRY RASH) 45 g 1  . Omeprazole ([facility] OMEPRAZOLE) 20 MG TBEC Take 1 tablet by mouth daily.     . valACYclovir (VALTREX) 500 MG tablet TAKE 1 TABLET BY MOUTH EVERY DAY AS NEEDED 30 tablet 0  . FLUoxetine (PROZAC) 20 MG tablet Take 1 tablet (20 mg total) by mouth daily. (Patient not taking: Reported on  09/01/2016) 30 tablet 3   No current facility-administered medications on file prior to visit.    She is allergic to sulfamethoxazole and sulfonamide derivatives..  Review of Systems Review of Systems  Constitutional: Negative for activity change, appetite change and fatigue.  HENT: Negative for hearing loss, congestion, tinnitus and ear discharge.  dentist q87m Eyes: Negative for visual disturbance (see optho q1y -- vision corrected to 20/20 with glasses).  Respiratory: Negative for cough, chest tightness and shortness of breath.   Cardiovascular: Negative for chest pain,  palpitations and leg swelling.  Gastrointestinal: Negative for abdominal pain, diarrhea, constipation and abdominal distention.  Genitourinary: Negative for urgency, frequency, decreased urine volume and difficulty urinating.  Musculoskeletal: Negative for back pain, arthralgias and gait problem.  Skin: Negative for color change, pallor and rash.  Neurological: Negative for dizziness, light-headedness, numbness and headaches.  Hematological: Negative for adenopathy. Does not bruise/bleed easily.  Psychiatric/Behavioral: Negative for suicidal ideas, confusion, sleep disturbance, self-injury, dysphoric mood, decreased concentration and agitation.       Objective:    BP 106/80 (BP Location: Right Arm, Cuff Size: Large)   Pulse 75   Temp 98 F (36.7 C) (Oral)   Resp 16   Ht 5\' 6"  (1.676 m)   Wt 202 lb (91.6 kg)   SpO2 98%   BMI 32.60 kg/m  General appearance: alert, cooperative, appears stated age and no distress Head: Normocephalic, without obvious abnormality, atraumatic Eyes: conjunctivae/corneas clear. PERRL, EOM's intact. Fundi benign. Ears: normal TM's and external ear canals both ears Nose: Nares normal. Septum midline. Mucosa normal. No drainage or sinus tenderness. Throat: lips, mucosa, and tongue normal; teeth and gums normal Neck: no adenopathy, no carotid bruit, no JVD, supple, symmetrical, trachea midline and thyroid not enlarged, symmetric, no tenderness/mass/nodules Back: symmetric, no curvature. ROM normal. No CVA tenderness. Lungs: clear to auscultation bilaterally Breasts: normal appearance, no masses or tenderness--  R breast scar from lumpectomy Heart: regular rate and rhythm, S1, S2 normal, no murmur, click, rub or gallop Abdomen: soft, non-tender; bowel sounds normal; no masses,  no organomegaly Pelvic: external genitalia normal, no adnexal masses or tenderness, no bladder tenderness, rectovaginal septum normal, uterus surgically absent and vagina normal without  discharge Extremities: extremities normal, atraumatic, no cyanosis or edema Pulses: 2+ and symmetric Skin: Skin color, texture, turgor normal. No rashes or lesions Lymph nodes: Cervical, supraclavicular, and axillary nodes normal. Neurologic: Alert and oriented X 3, normal strength and tone. Normal symmetric reflexes. Normal coordination and gait    Assessment:    Healthy female exam.      Plan:    ghm utd Check labs See After Visit Summary for Counseling Recommendations    1. Preventative health care See above - Comprehensive metabolic panel - CBC with Differential/Platelet - Lipid panel - TSH - POCT Urinalysis Dipstick (Automated)  2. Hypothyroidism, unspecified type  - TSH  3. Abdominal bloating fam hx ovarian cancer - US Pelvis Complete; Future - US Transvaginal Non-OB; Future  4. Family history of ovarian cancer  - US Pelvis Complete; Future - US Transvaginal Non-OB; Future

## 2016-09-01 NOTE — Progress Notes (Signed)
Pre visit review using our clinic review tool, if applicable. No additional management support is needed unless otherwise documented below in the visit note. 

## 2016-09-02 ENCOUNTER — Telehealth: Payer: Self-pay

## 2016-09-02 NOTE — Telephone Encounter (Signed)
Received PA approval. PA approved through 01/02/2017. PA notification sent for scanning.

## 2016-09-02 NOTE — Telephone Encounter (Signed)
PA initiated via Covermymeds; KEY: [id number]. Awaiting determination.

## 2016-09-29 ENCOUNTER — Telehealth: Payer: Self-pay | Admitting: Family Medicine

## 2016-09-29 DIAGNOSIS — F419 Anxiety disorder, unspecified: Principal | ICD-10-CM

## 2016-09-29 DIAGNOSIS — F329 Major depressive disorder, single episode, unspecified: Secondary | ICD-10-CM

## 2016-09-29 MED ORDER — ALPRAZOLAM 0.25 MG PO TABS: 0.2500 mg | ORAL_TABLET | Freq: Three times a day (TID) | ORAL | 0 refills | Status: DC | PRN

## 2016-09-29 NOTE — Telephone Encounter (Signed)
Requesting:   alprazolam Contract    05/16/2014 UDS   Low risk  09/04/2015 Last OV    09/01/2016 Last Refill    #60 on 08/24/2016  Please Advise--pharmacy [facility] [place]  [phone]

## 2016-09-29 NOTE — Telephone Encounter (Signed)
Faxed hardcopy for alprazolam to [facility] in [place]

## 2016-09-29 NOTE — Telephone Encounter (Signed)
Refill x1 

## 2016-10-13 ENCOUNTER — Other Ambulatory Visit: Payer: Self-pay | Admitting: Family Medicine

## 2016-10-27 ENCOUNTER — Other Ambulatory Visit: Payer: Self-pay | Admitting: Family Medicine

## 2016-10-27 DIAGNOSIS — F419 Anxiety disorder, unspecified: Principal | ICD-10-CM

## 2016-10-27 DIAGNOSIS — F32A Depression, unspecified: Secondary | ICD-10-CM

## 2016-10-27 DIAGNOSIS — F329 Major depressive disorder, single episode, unspecified: Secondary | ICD-10-CM

## 2016-10-28 NOTE — Telephone Encounter (Signed)
Database printed, placed on ledge.  

## 2016-10-28 NOTE — Telephone Encounter (Signed)
Refill x1   1 refill 

## 2016-10-28 NOTE — Telephone Encounter (Signed)
Rx faxed to [facility] pharmacy.  

## 2016-10-28 NOTE — Telephone Encounter (Signed)
Rx printed, awaiting DO signature.  

## 2016-10-28 NOTE — Telephone Encounter (Signed)
Pt is requesting refill on alprazolam 0.25mg   Last OV: 09/01/2016 Last Fill: 09/29/2016 #60 and 0RF  UDS: 09/04/2015 Low risk  Please advise.

## 2016-11-24 ENCOUNTER — Other Ambulatory Visit: Payer: Self-pay | Admitting: Family Medicine

## 2016-12-27 ENCOUNTER — Ambulatory Visit (INDEPENDENT_AMBULATORY_CARE_PROVIDER_SITE_OTHER): Payer: BC Managed Care – PPO | Admitting: Family Medicine

## 2016-12-27 ENCOUNTER — Encounter: Payer: Self-pay | Admitting: Family Medicine

## 2016-12-27 VITALS — BP 128/78 | HR 73 | Wt 202.0 lb

## 2016-12-27 DIAGNOSIS — R200 Anesthesia of skin: Secondary | ICD-10-CM | POA: Diagnosis not present

## 2016-12-27 DIAGNOSIS — R202 Paresthesia of skin: Secondary | ICD-10-CM | POA: Diagnosis not present

## 2016-12-27 DIAGNOSIS — M62838 Other muscle spasm: Secondary | ICD-10-CM

## 2016-12-27 DIAGNOSIS — M25512 Pain in left shoulder: Secondary | ICD-10-CM | POA: Diagnosis not present

## 2016-12-27 MED ORDER — CYCLOBENZAPRINE HCL 10 MG PO TABS: 10.0000 mg | ORAL_TABLET | Freq: Three times a day (TID) | ORAL | 0 refills | Status: DC | PRN

## 2016-12-27 NOTE — Patient Instructions (Signed)
Carpal Tunnel Release, Care After °Refer to this sheet in the next few weeks. These instructions provide you with information about caring for yourself after your procedure. Your health care provider may also give you more specific instructions. Your treatment has been planned according to current medical practices, but problems sometimes occur. Call your health care provider if you have any problems or questions after your procedure. °What can I expect after the procedure? °After your procedure, it is typical to have the following: °· Pain. °· Numbness. °· Tingling. °· Swelling. °· Stiffness. °· Bruising. ° °Follow these instructions at home: °· Take medicines only as directed by your health care provider. °· There are many different ways to close and cover an incision, including stitches (sutures), skin glue, and adhesive strips. Follow your health care provider's instructions about: °? Incision care. °? Bandage (dressing) changes and removal. °? Incision closure removal. °· Wear a splint or a brace as directed by your surgeon. You may need to do this for 2-3 weeks. °· Keep your hand raised (elevated) above the level of your heart while you are resting. Move your fingers often. °· Avoid activities that cause hand pain. °· Ask your surgeon when you can start to do all of your usual activities again, such as: °? Driving. °? Returning to work. °? Bathing and swimming. °· Keep all follow-up visits as directed by your health care provider. This is important. You may need physical therapy for several months to speed healing and regain movement. °Contact a health care provider if: °· You have drainage, redness, swelling, or pain at your incision site. °· You have a fever. °· You have chills. °· Your pain medicine is not working. °· Your symptoms do not go away after 2 months. °· Your symptoms go away and then return. °Get help right away if: °· You have pain or numbness that is getting worse. °· Your fingers change  color. °· You are not able to move your fingers. °This information is not intended to replace advice given to you by your health care provider. Make sure you discuss any questions you have with your health care provider. °Document Released: 11/12/2004 Document Revised: 10/01/2015 Document Reviewed: 12/11/2013 °Elsevier Interactive Patient Education © 2017 [employer] ° °

## 2016-12-27 NOTE — Progress Notes (Signed)
Patient ID: Norma Kelley, female    DOB: 11-Feb-1961  Age: 56 y.o. MRN: [id number]    Subjective:  Subjective  HPI Norma Kelley presents for numbness and tingling in L hand and pain in L shoulder pain and tingling in forearm  ---started end of June . Feeling is there all the time.  Her shoulder has started to pop -- no known injury.      Bending the L wrist does make numbness worse -----  Review of Systems  Constitutional: Negative for appetite change, diaphoresis, fatigue and unexpected weight change.  Eyes: Negative for pain, redness and visual disturbance.  Respiratory: Negative for cough, chest tightness, shortness of breath and wheezing.   Cardiovascular: Negative for chest pain, palpitations and leg swelling.  Endocrine: Negative for cold intolerance, heat intolerance, polydipsia, polyphagia and polyuria.  Genitourinary: Negative for difficulty urinating, dysuria and frequency.  Neurological: Positive for numbness. Negative for dizziness, weakness, light-headedness and headaches.    History Past Medical History:  Diagnosis Date  . Anxiety   . Breast cancer ([place]) 2007   left breast cancer  . GERD (gastroesophageal reflux disease)   . Hypothyroidism   . Thyroid disease     She has a past surgical history that includes Abdominal hysterectomy; Tonsillectomy; Breast surgery; Mastectomy (Left, 2007); Back surgery (2013); and Breast lumpectomy with radioactive seed localization (Right, 08/17/2015).   Her family history includes Colon cancer in her maternal grandfather; Diabetes in her mother; Head & neck cancer in her maternal grandmother; Heart attack (age of onset: 38) in her father; Heart attack (age of onset: 39) in her mother; Hyperlipidemia in her mother; Osteoporosis in her mother.She reports that she has quit smoking. Her smoking use included Cigarettes. She has a 20.00 pack-year smoking history. She has never used smokeless tobacco. She reports that she does not drink alcohol  or use drugs.  Current Outpatient Prescriptions on File Prior to Visit  Medication Sig Dispense Refill  . ALPRAZolam (XANAX) 0.25 MG tablet Take 1 tablet (0.25 mg total) by mouth 3 (three) times daily as needed. 60 tablet 1  . aspirin 81 MG tablet Take 81 mg by mouth daily.    . cetirizine (ZYRTEC) 10 MG tablet Take 10 mg by mouth daily.    Marland Kitchen desonide (DESOWEN) 0.05 % ointment APP EXT AA BID FOR 14 DAYS  0  . levothyroxine (SYNTHROID, LEVOTHROID) 100 MCG tablet TAKE 1 TABLET(100 MCG) BY MOUTH DAILY 30 tablet 0  . mometasone (ELOCON) 0.1 % cream APPLY TO THE AFFECTED AREA AS NEEDED (Patient taking differently: APPLY TO THE AFFECTED AREA AS NEEDED FOR DRY RASH) 45 g 1  . Omeprazole ([facility]S OMEPRAZOLE) 20 MG TBEC Take 1 tablet by mouth daily.     . valACYclovir (VALTREX) 500 MG tablet TAKE 1 TABLET BY MOUTH EVERY DAY AS NEEDED 30 tablet 0   No current facility-administered medications on file prior to visit.      Objective:  Objective  Physical Exam  Constitutional: She is oriented to person, place, and time. She appears well-developed and well-nourished.  HENT:  Head: Normocephalic and atraumatic.  Eyes: Conjunctivae and EOM are normal.  Neck: Normal range of motion. Neck supple. No JVD present. Carotid bruit is not present. No thyromegaly present.  Cardiovascular: Normal rate, regular rhythm and normal heart sounds.   No murmur heard. Pulmonary/Chest: Effort normal and breath sounds normal. No respiratory distress. She has no wheezes. She has no rales. She exhibits no tenderness.  Musculoskeletal: She exhibits  no edema.  Neurological: She is alert and oriented to person, place, and time.  Pain in L shoulder  Tingling in L foream and numbness L hand   Psychiatric: She has a normal mood and affect.  Nursing note and vitals reviewed.  BP 128/78   Pulse 73   Wt 202 lb (91.6 kg)   SpO2 98%   BMI 32.60 kg/m  Wt Readings from Last 3 Encounters:  12/27/16 202 lb (91.6 kg)  09/01/16 202  lb (91.6 kg)  06/23/16 202 lb 9.6 oz (91.9 kg)     Lab Results  Component Value Date   WBC 7.8 09/01/2016   HGB 13.7 09/01/2016   HCT 41.3 09/01/2016   PLT 300.0 09/01/2016   GLUCOSE 102 (H) 09/01/2016   CHOL 161 09/01/2016   TRIG 54.0 09/01/2016   HDL 57.40 09/01/2016   LDLCALC 93 09/01/2016   ALT 22 09/01/2016   AST 22 09/01/2016   NA 142 09/01/2016   K 4.8 09/01/2016   CL 107 09/01/2016   CREATININE 0.88 09/01/2016   BUN 14 09/01/2016   CO2 29 09/01/2016   TSH 2.66 09/01/2016   MICROALBUR 0.4 07/16/2012    US Transvaginal Non-ob  Result Date: 09/01/2016 CLINICAL DATA:  Initial evaluation for abdominal bloating for 2 years. Family history of ovarian cancer. History of prior hysterectomy. EXAM: TRANSABDOMINAL AND TRANSVAGINAL ULTRASOUND OF PELVIS TECHNIQUE: Both transabdominal and transvaginal ultrasound examinations of the pelvis were performed. Transabdominal technique was performed for global imaging of the pelvis including uterus, ovaries, adnexal regions, and pelvic cul-de-sac. It was necessary to proceed with endovaginal exam following the transabdominal exam to visualize the pelvic viscera. COMPARISON:  None available. FINDINGS: Uterus Surgically absent.  No abnormality seen at the vaginal cuff. Endometrium Surgically absent. Right ovary Measurements: 2.6 x 1.0 x 2.0 cm. Normal appearance/no adnexal mass. Left ovary Not visualized.  No adnexal mass. Other findings No abnormal free fluid. IMPRESSION: 1. No acute abnormality identified within the pelvis. No findings to explain patient's symptoms. 2. Status post hysterectomy. 3. Normal sonographic appearance of the right ovary. 4. Nonvisualization of the left ovary. No adnexal mass lesions identified. Electronically Signed   By: Jeannine Boga M.D.   On: 09/01/2016 15:47   US Pelvis Complete  Result Date: 09/01/2016 CLINICAL DATA:  Initial evaluation for abdominal bloating for 2 years. Family history of ovarian cancer.  History of prior hysterectomy. EXAM: TRANSABDOMINAL AND TRANSVAGINAL ULTRASOUND OF PELVIS TECHNIQUE: Both transabdominal and transvaginal ultrasound examinations of the pelvis were performed. Transabdominal technique was performed for global imaging of the pelvis including uterus, ovaries, adnexal regions, and pelvic cul-de-sac. It was necessary to proceed with endovaginal exam following the transabdominal exam to visualize the pelvic viscera. COMPARISON:  None available. FINDINGS: Uterus Surgically absent.  No abnormality seen at the vaginal cuff. Endometrium Surgically absent. Right ovary Measurements: 2.6 x 1.0 x 2.0 cm. Normal appearance/no adnexal mass. Left ovary Not visualized.  No adnexal mass. Other findings No abnormal free fluid. IMPRESSION: 1. No acute abnormality identified within the pelvis. No findings to explain patient's symptoms. 2. Status post hysterectomy. 3. Normal sonographic appearance of the right ovary. 4. Nonvisualization of the left ovary. No adnexal mass lesions identified. Electronically Signed   By: Jeannine Boga M.D.   On: 09/01/2016 15:47     Assessment & Plan:  Plan  I have discontinued Ms. Chipps's FLUoxetine, Liraglutide -Weight Management, and Insulin Pen Needle. I am also having her start on cyclobenzaprine. Additionally, I am having  her maintain her Omeprazole, aspirin, mometasone, cetirizine, desonide, valACYclovir, ALPRAZolam, and levothyroxine.  Meds ordered this encounter  Medications  . cyclobenzaprine (FLEXERIL) 10 MG tablet    Sig: Take 1 tablet (10 mg total) by mouth 3 (three) times daily as needed for muscle spasms.    Dispense:  30 tablet    Refill:  0    Problem List Items Addressed This Visit    None    Visit Diagnoses    Numbness and tingling in left hand    -  Primary   Relevant Orders   Ambulatory referral to Orthopedic Surgery   Muscle spasm       Relevant Medications   cyclobenzaprine (FLEXERIL) 10 MG tablet   Acute pain of left  shoulder       Relevant Orders   Ambulatory referral to Orthopedic Surgery    wrist splint L hand-  Refer to hand specialist or further evaluation  Follow-up: Return if symptoms worsen or fail to improve.  Ann Held, DO

## 2016-12-28 ENCOUNTER — Other Ambulatory Visit: Payer: Self-pay | Admitting: Family Medicine

## 2016-12-30 ENCOUNTER — Other Ambulatory Visit: Payer: Self-pay

## 2016-12-30 MED ORDER — LEVOTHYROXINE SODIUM 100 MCG PO TABS: ORAL_TABLET | ORAL | 2 refills | Status: DC

## 2016-12-30 NOTE — Telephone Encounter (Signed)
Faxed Levothyroxine 30d+2 till OV/thx dmf

## 2017-01-02 ENCOUNTER — Other Ambulatory Visit: Payer: Self-pay | Admitting: Family Medicine

## 2017-01-02 DIAGNOSIS — F419 Anxiety disorder, unspecified: Principal | ICD-10-CM

## 2017-01-02 DIAGNOSIS — F329 Major depressive disorder, single episode, unspecified: Secondary | ICD-10-CM

## 2017-01-03 NOTE — Telephone Encounter (Signed)
Rx faxed to [facility] pharmacy.  

## 2017-01-03 NOTE — Telephone Encounter (Signed)
Pt is requesting refill on alprazolam 0.25mg .  Last OV: 12/27/2016  Last Fill: 10/28/2016 #60 and 1RF UDS: 09/04/2015 Low risk  [place] database 11/02/2016 (in media)  Please advise.

## 2017-01-03 NOTE — Telephone Encounter (Signed)
Refill x1 

## 2017-01-03 NOTE — Telephone Encounter (Signed)
Rx printed, awaiting DO signature.  

## 2017-01-04 ENCOUNTER — Encounter: Payer: Self-pay | Admitting: Family Medicine

## 2017-01-17 ENCOUNTER — Other Ambulatory Visit: Payer: Self-pay | Admitting: Family Medicine

## 2017-01-29 ENCOUNTER — Encounter: Payer: Self-pay | Admitting: Family Medicine

## 2017-01-30 MED ORDER — MOMETASONE FUROATE 0.1 % EX CREA: TOPICAL_CREAM | CUTANEOUS | 1 refills | Status: DC

## 2017-02-05 ENCOUNTER — Other Ambulatory Visit: Payer: Self-pay | Admitting: Family Medicine

## 2017-02-05 DIAGNOSIS — F329 Major depressive disorder, single episode, unspecified: Secondary | ICD-10-CM

## 2017-02-05 DIAGNOSIS — F419 Anxiety disorder, unspecified: Principal | ICD-10-CM

## 2017-02-06 NOTE — Telephone Encounter (Signed)
Rx faxed to pharmacy   [facility]

## 2017-02-06 NOTE — Telephone Encounter (Signed)
Covering for Dr. Etter Sjogren  Requesting:Xanax  Contract:No UDS:03/05/16 low risk  Last OV:12/27/16 Next OV:03/10/17 Last Refill:01/03/17 #60-0rf   Please advise

## 2017-03-10 ENCOUNTER — Encounter: Payer: Self-pay | Admitting: Family Medicine

## 2017-03-10 ENCOUNTER — Ambulatory Visit (INDEPENDENT_AMBULATORY_CARE_PROVIDER_SITE_OTHER): Payer: BC Managed Care – PPO | Admitting: Family Medicine

## 2017-03-10 VITALS — BP 120/78 | HR 69 | Temp 98.4°F | Resp 16 | Ht 66.0 in | Wt 205.8 lb

## 2017-03-10 DIAGNOSIS — R739 Hyperglycemia, unspecified: Secondary | ICD-10-CM

## 2017-03-10 DIAGNOSIS — R5383 Other fatigue: Secondary | ICD-10-CM | POA: Diagnosis not present

## 2017-03-10 DIAGNOSIS — Z23 Encounter for immunization: Secondary | ICD-10-CM | POA: Diagnosis not present

## 2017-03-10 DIAGNOSIS — E039 Hypothyroidism, unspecified: Secondary | ICD-10-CM | POA: Diagnosis not present

## 2017-03-10 LAB — COMPREHENSIVE METABOLIC PANEL
ALK PHOS: 90 U/L (ref 39–117)
ALT: 15 U/L (ref 0–35)
AST: 16 U/L (ref 0–37)
Albumin: 4.6 g/dL (ref 3.5–5.2)
BUN: 16 mg/dL (ref 6–23)
CO2: 29 meq/L (ref 19–32)
Calcium: 9.8 mg/dL (ref 8.4–10.5)
Chloride: 105 mEq/L (ref 96–112)
Creatinine, Ser: 0.74 mg/dL (ref 0.40–1.20)
GFR: 85.51 mL/min (ref >60.00)
GLUCOSE: 97 mg/dL (ref 70–99)
POTASSIUM: 4.2 meq/L (ref 3.5–5.1)
Sodium: 141 mEq/L (ref 135–145)
TOTAL PROTEIN: 7.3 g/dL (ref 6.0–8.3)
Total Bilirubin: 0.6 mg/dL (ref 0.2–1.2)

## 2017-03-10 LAB — LIPID PANEL
Cholesterol: 146 mg/dL (ref 0–200)
HDL: 61.4 mg/dL (ref >39.00)
LDL Cholesterol: 73 mg/dL (ref 0–99)
NONHDL: 84.55
Total CHOL/HDL Ratio: 2
Triglycerides: 58 mg/dL (ref 0.0–149.0)
VLDL: 11.6 mg/dL (ref 0.0–40.0)

## 2017-03-10 LAB — CBC WITH DIFFERENTIAL/PLATELET
Basophils Absolute: 0 10*3/uL (ref 0.0–0.1)
Basophils Relative: 0.6 % (ref 0.0–3.0)
EOS PCT: 2 % (ref 0.0–5.0)
Eosinophils Absolute: 0.1 10*3/uL (ref 0.0–0.7)
HEMATOCRIT: 40.2 % (ref 36.0–46.0)
HEMOGLOBIN: 13.4 g/dL (ref 12.0–15.0)
LYMPHS ABS: 1.9 10*3/uL (ref 0.7–4.0)
LYMPHS PCT: 26.1 % (ref 12.0–46.0)
MCHC: 33.4 g/dL (ref 30.0–36.0)
MCV: 89 fl (ref 78.0–100.0)
MONOS PCT: 5.6 % (ref 3.0–12.0)
Monocytes Absolute: 0.4 10*3/uL (ref 0.1–1.0)
Neutro Abs: 4.8 10*3/uL (ref 1.4–7.7)
Neutrophils Relative %: 65.7 % (ref 43.0–77.0)
PLATELETS: 276 10*3/uL (ref 150.0–400.0)
RBC: 4.51 Mil/uL (ref 3.87–5.11)
RDW: 13.6 % (ref 11.5–15.5)
WBC: 7.3 10*3/uL (ref 4.0–10.5)

## 2017-03-10 LAB — VITAMIN B12: VITAMIN B 12: 548 pg/mL (ref 211–911)

## 2017-03-10 LAB — TSH: TSH: 1.61 u[IU]/mL (ref 0.35–4.50)

## 2017-03-10 LAB — HEMOGLOBIN A1C: Hgb A1c MFr Bld: 6 % (ref 4.6–6.5)

## 2017-03-10 NOTE — Progress Notes (Signed)
Patient ID: Norma Kelley, female   DOB: 06-May-1961, 56 y.o.   MRN: [id number]     Subjective:  I acted as a Education administrator for Dr. Carollee Herter.  Guerry Bruin, [place]   Patient ID: Norma Kelley, female    DOB: [birth year], 56 y.o.   MRN: [id number]  Chief Complaint  Patient presents with  . Fatigue  . Hypothyroidism    HPI  Patient is in today for 6 month follow up.  Patient also complains about fatigue and is interested in getting her thyroid checked.  Patient Care Team: Carollee Herter, Alferd Apa, DO as PCP - General   Past Medical History:  Diagnosis Date  . Anxiety   . Breast cancer ([place]) 2007   left breast cancer  . GERD (gastroesophageal reflux disease)   . Hypothyroidism   . Thyroid disease     Past Surgical History:  Procedure Laterality Date  . ABDOMINAL HYSTERECTOMY    . BACK SURGERY  2013   lumbar  . BREAST LUMPECTOMY WITH RADIOACTIVE SEED LOCALIZATION Right 08/17/2015   Procedure: BREAST LUMPECTOMY WITH RADIOACTIVE SEED LOCALIZATION;  Surgeon: Excell Seltzer, MD;  Location: [facility];  Service: General;  Laterality: Right;  . BREAST SURGERY     Mastectomy and reconstruction  . MASTECTOMY Left 2007   mastectomy and tamoxifen  . TONSILLECTOMY      Family History  Problem Relation Age of Onset  . Hyperlipidemia Mother   . Osteoporosis Mother   . Diabetes Mother   . Heart attack Mother 69  . Heart attack Father 73       MASSIVE MI,  Died age 6  . Colon cancer Maternal Grandfather        dx in late 60s-early 70s  . Head & neck cancer Maternal Grandmother        cancer of the sinuses  . Pancreatic cancer Neg Hx   . Stomach cancer Neg Hx   . Rectal cancer Neg Hx     Social History   Social History  . Marital status: Married    Spouse name: N/A  . Number of children: 3  . Years of education: N/A   Occupational History  .  [employer]   Social History Main Topics  . Smoking status: Former Smoker    Packs/day: 1.00    Years: 20.00    Types:  Cigarettes  . Smokeless tobacco: Never Used     Comment: Quit 14 years ago  . Alcohol use No  . Drug use: No  . Sexual activity: Yes   Other Topics Concern  . Not on file   Social History Narrative   Lives with husband.  Occasional exercise.    Outpatient Medications Prior to Visit  Medication Sig Dispense Refill  . ALPRAZolam (XANAX) 0.25 MG tablet TAKE 1 TABLET BY MOUTH THREE TIMES DAILY AS NEEDED 60 tablet 0  . aspirin 81 MG tablet Take 81 mg by mouth daily.    . cetirizine (ZYRTEC) 10 MG tablet Take 10 mg by mouth daily.    Marland Kitchen desonide (DESOWEN) 0.05 % ointment APP EXT AA BID FOR 14 DAYS  0  . levothyroxine (SYNTHROID, LEVOTHROID) 100 MCG tablet TAKE 1 TABLET(100 MCG) BY MOUTH DAILY 30 tablet 2  . mometasone (ELOCON) 0.1 % cream APPLY TO THE AFFECTED AREA AS NEEDED FOR DRY RASH 45 g 1  . Omeprazole ([facility] OMEPRAZOLE) 20 MG TBEC Take 1 tablet by mouth daily.     . valACYclovir (VALTREX) 500 MG  tablet TAKE 1 TABLET BY MOUTH EVERY DAY AS NEEDED 30 tablet 0  . cyclobenzaprine (FLEXERIL) 10 MG tablet Take 1 tablet (10 mg total) by mouth 3 (three) times daily as needed for muscle spasms. 30 tablet 0   No facility-administered medications prior to visit.     Allergies  Allergen Reactions  . Sulfamethoxazole Itching    Swelling around eyes  . Sulfonamide Derivatives     Review of Systems  Constitutional: Positive for malaise/fatigue. Negative for chills and fever.  HENT: Negative for congestion and hearing loss.   Eyes: Negative for discharge.  Respiratory: Negative for cough, sputum production and shortness of breath.   Cardiovascular: Negative for chest pain, palpitations and leg swelling.  Gastrointestinal: Negative for abdominal pain, blood in stool, constipation, diarrhea, heartburn, nausea and vomiting.  Genitourinary: Negative for dysuria, frequency, hematuria and urgency.  Musculoskeletal: Negative for back pain, falls and myalgias.  Skin: Negative for rash.    Neurological: Negative for dizziness, sensory change, loss of consciousness, weakness and headaches.  Endo/Heme/Allergies: Negative for environmental allergies. Does not bruise/bleed easily.  Psychiatric/Behavioral: Negative for depression and suicidal ideas. The patient is not nervous/anxious and does not have insomnia.        Objective:    Physical Exam  Constitutional: She is oriented to person, place, and time. She appears well-developed and well-nourished.  HENT:  Head: Normocephalic and atraumatic.  Eyes: Conjunctivae and EOM are normal.  Neck: Normal range of motion. Neck supple. No JVD present. Carotid bruit is not present. No thyromegaly present.  Cardiovascular: Normal rate, regular rhythm and normal heart sounds.   No murmur heard. Pulmonary/Chest: Effort normal and breath sounds normal. No respiratory distress. She has no wheezes. She has no rales. She exhibits no tenderness.  Musculoskeletal: She exhibits no edema.  Neurological: She is alert and oriented to person, place, and time.  Psychiatric: She has a normal mood and affect.  Nursing note and vitals reviewed.   BP 120/78 (BP Location: Left Arm, Cuff Size: Normal)   Pulse 69   Temp 98.4 F (36.9 C) (Oral)   Resp 16   Ht 5\' 6"  (1.676 m)   Wt 205 lb 12.8 oz (93.4 kg)   SpO2 98%   BMI 33.22 kg/m  Wt Readings from Last 3 Encounters:  03/10/17 205 lb 12.8 oz (93.4 kg)  12/27/16 202 lb (91.6 kg)  09/01/16 202 lb (91.6 kg)   BP Readings from Last 3 Encounters:  03/10/17 120/78  12/27/16 128/78  09/01/16 106/80     Immunization History  Administered Date(s) Administered  . Influenza,inj,Quad PF,6+ Mos 02/20/2013, 03/10/2017  . Influenza-Unspecified 02/26/2014, 02/07/2015, 02/07/2016  . Tdap 12/20/2012    Health Maintenance  Topic Date Due  . MAMMOGRAM  07/12/2018  . COLONOSCOPY  11/15/2018  . TETANUS/TDAP  12/21/2022  . INFLUENZA VACCINE  Completed  . Hepatitis C Screening  Completed  . HIV  Screening  Completed    Lab Results  Component Value Date   WBC 7.3 03/10/2017   HGB 13.4 03/10/2017   HCT 40.2 03/10/2017   PLT 276.0 03/10/2017   GLUCOSE 97 03/10/2017   CHOL 146 03/10/2017   TRIG 58.0 03/10/2017   HDL 61.40 03/10/2017   LDLCALC 73 03/10/2017   ALT 15 03/10/2017   AST 16 03/10/2017   NA 141 03/10/2017   K 4.2 03/10/2017   CL 105 03/10/2017   CREATININE 0.74 03/10/2017   BUN 16 03/10/2017   CO2 29 03/10/2017   TSH 1.61  03/10/2017   HGBA1C 6.0 03/10/2017   MICROALBUR 0.4 07/16/2012    Lab Results  Component Value Date   TSH 1.61 03/10/2017   Lab Results  Component Value Date   WBC 7.3 03/10/2017   HGB 13.4 03/10/2017   HCT 40.2 03/10/2017   MCV 89.0 03/10/2017   PLT 276.0 03/10/2017   Lab Results  Component Value Date   NA 141 03/10/2017   K 4.2 03/10/2017   CO2 29 03/10/2017   GLUCOSE 97 03/10/2017   BUN 16 03/10/2017   CREATININE 0.74 03/10/2017   BILITOT 0.6 03/10/2017   ALKPHOS 90 03/10/2017   AST 16 03/10/2017   ALT 15 03/10/2017   PROT 7.3 03/10/2017   ALBUMIN 4.6 03/10/2017   CALCIUM 9.8 03/10/2017   ANIONGAP 9 09/10/2015   GFR 85.51 03/10/2017   Lab Results  Component Value Date   CHOL 146 03/10/2017   Lab Results  Component Value Date   HDL 61.40 03/10/2017   Lab Results  Component Value Date   LDLCALC 73 03/10/2017   Lab Results  Component Value Date   TRIG 58.0 03/10/2017   Lab Results  Component Value Date   CHOLHDL 2 03/10/2017   Lab Results  Component Value Date   HGBA1C 6.0 03/10/2017         Assessment & Plan:   Problem List Items Addressed This Visit      Unprioritized   Hypothyroidism - Primary    Check labs  con't synthroid      Relevant Orders   TSH (Completed)    Other Visit Diagnoses    Hyperglycemia       Relevant Orders   Hemoglobin A1c (Completed)   Fatigue, unspecified type       Relevant Orders   Vitamin B12 (Completed)   TSH (Completed)   Lipid panel (Completed)   CBC  with Differential/Platelet (Completed)   Comprehensive metabolic panel (Completed)   Hemoglobin A1c (Completed)   Needs flu shot       Relevant Orders   Flu Vaccine QUAD 36+ mos IM (Fluarix & Fluzone Quad PF (Completed)      I have discontinued Ms. Kem's cyclobenzaprine. I am also having her maintain her Omeprazole, aspirin, cetirizine, desonide, levothyroxine, valACYclovir, mometasone, and ALPRAZolam.  No orders of the defined types were placed in this encounter.   CMA served as Education administrator during this visit. History, Physical and Plan performed by medical provider. Documentation and orders reviewed and attested to.  Ann Held, DO

## 2017-03-10 NOTE — Patient Instructions (Signed)
Hyperglycemia Hyperglycemia is when the sugar (glucose) level in your blood is too high. It may not cause symptoms. If you do have symptoms, they may include warning signs, such as:  Feeling more thirsty than normal.  Hunger.  Feeling tired.  Needing to pee (urinate) more than normal.  Blurry eyesight (vision). You may get other symptoms as it gets worse, such as:  Dry mouth.  Not being hungry (loss of appetite).  Fruity-smelling breath.  Weakness.  Weight gain or loss that is not planned. Weight loss may be fast.  A tingling or numb feeling in your hands or feet.  Headache.  Skin that does not bounce back quickly when it is lightly pinched and released (poor skin turgor).  Pain in your belly (abdomen).  Cuts or bruises that heal slowly. High blood sugar can happen to people who do or do not have diabetes. High blood sugar can happen slowly or quickly, and it can be an emergency. Follow these instructions at home: General instructions  Take over-the-counter and prescription medicines only as told by your doctor.  Do not use products that contain nicotine or tobacco, such as cigarettes and e-cigarettes. If you need help quitting, ask your doctor.  Limit alcohol intake to no more than 1 drink per day for nonpregnant women and 2 drinks per day for men. One drink equals 12 oz of beer, 5 oz of wine, or 1 oz of hard liquor.  Manage stress. If you need help with this, ask your doctor.  Keep all follow-up visits as told by your doctor. This is important. Eating and drinking  Stay at a healthy weight.  Exercise regularly, as told by your doctor.  Drink enough fluid, especially when you:  Exercise.  Get sick.  Are in hot temperatures.  Eat healthy foods, such as:  Low-fat (lean) proteins.  Complex carbs (complex carbohydrates), such as whole wheat bread or brown rice.  Fresh fruits and vegetables.  Low-fat dairy products.  Healthy fats.  Drink enough  fluid to keep your pee (urine) clear or pale yellow. If you have diabetes:  Make sure you know the symptoms of hyperglycemia.  Follow your diabetes management plan, as told by your doctor. Make sure you:  Take insulin and medicines as told.  Follow your exercise plan.  Follow your meal plan. Eat on time. Do not skip meals.  Check your blood sugar as often as told. Make sure to check before and after exercise. If you exercise longer or in a different way than you normally do, check your blood sugar more often.  Follow your sick day plan whenever you cannot eat or drink normally. Make this plan ahead of time with your doctor.  Share your diabetes management plan with people in your workplace, school, and household.  Check your urine for ketones when you are ill and as told by your doctor.  Carry a card or wear jewelry that says that you have diabetes. Contact a doctor if:  Your blood sugar level is higher than 240 mg/dL (13.3 mmol/L) for 2 days in a row.  You have problems keeping your blood sugar in your target range.  High blood sugar happens often for you. Get help right away if:  You have trouble breathing.  You have a change in how you think, feel, or act (mental status).  You feel sick to your stomach (nauseous), and that feeling does not go away.  You cannot stop throwing up (vomiting). These symptoms may be an   emergency. Do not wait to see if the symptoms will go away. Get medical help right away. Call your local emergency services (911 in the [place] Do not drive yourself to the hospital.  Summary  Hyperglycemia is when the sugar (glucose) level in your blood is too high.  High blood sugar can happen to people who do or do not have diabetes.  Make sure you drink enough fluids, eat healthy foods, and exercise regularly.  Contact your doctor if you have problems keeping your blood sugar in your target range. This information is not intended to replace advice given  to you by your health care provider. Make sure you discuss any questions you have with your health care provider. Document Released: 02/20/2009 Document Revised: 01/11/2016 Document Reviewed: 01/11/2016 Elsevier Interactive Patient Education  2017 [employer]  

## 2017-03-10 NOTE — Assessment & Plan Note (Signed)
Check labs con't synthroid 

## 2017-03-10 NOTE — Addendum Note (Signed)
Addended by: Kem Boroughs D on: 03/10/2017 01:19 PM   Modules accepted: Orders

## 2017-03-13 ENCOUNTER — Other Ambulatory Visit: Payer: Self-pay

## 2017-03-13 DIAGNOSIS — F329 Major depressive disorder, single episode, unspecified: Secondary | ICD-10-CM

## 2017-03-13 DIAGNOSIS — F419 Anxiety disorder, unspecified: Principal | ICD-10-CM

## 2017-03-13 MED ORDER — ALPRAZOLAM 0.25 MG PO TABS: 0.2500 mg | ORAL_TABLET | Freq: Three times a day (TID) | ORAL | 0 refills | Status: DC | PRN

## 2017-03-13 NOTE — Telephone Encounter (Signed)
rx faxed to [facility].  

## 2017-03-13 NOTE — Telephone Encounter (Signed)
RequestingDuanne Moron Contract: None UDS: None Last OV: 03/10/17 Next OV: 09/08/17 Last Refill: 02/05/17 [id number]   Please advise

## 2017-03-26 ENCOUNTER — Other Ambulatory Visit: Payer: Self-pay | Admitting: Family Medicine

## 2017-04-12 ENCOUNTER — Other Ambulatory Visit: Payer: Self-pay | Admitting: Family Medicine

## 2017-04-12 DIAGNOSIS — F329 Major depressive disorder, single episode, unspecified: Secondary | ICD-10-CM

## 2017-04-12 DIAGNOSIS — F419 Anxiety disorder, unspecified: Principal | ICD-10-CM

## 2017-04-13 NOTE — Telephone Encounter (Signed)
[facility] [address] requesting refill for alprazolam  Database ran and is on your desk for review.'  Last filled per database: 03/13/17 Last written: 03/13/17 Last ov: 03/10/17 Next ov: 09/08/17 Contract: none UDS: none

## 2017-04-21 ENCOUNTER — Other Ambulatory Visit: Payer: Self-pay | Admitting: Family Medicine

## 2017-05-11 ENCOUNTER — Other Ambulatory Visit: Payer: Self-pay | Admitting: Family Medicine

## 2017-05-11 DIAGNOSIS — F419 Anxiety disorder, unspecified: Principal | ICD-10-CM

## 2017-05-11 DIAGNOSIS — F329 Major depressive disorder, single episode, unspecified: Secondary | ICD-10-CM

## 2017-05-12 NOTE — Telephone Encounter (Signed)
Called Pt and scheduled a lab appointment for UDS on 05/17/16.

## 2017-05-12 NOTE — Telephone Encounter (Signed)
Refill x1 each Pt needs uds and contract

## 2017-05-12 NOTE — Telephone Encounter (Signed)
Last filled per database: 04/13/17  Last written:  04/13/17 Last ov: 03/10/17 Next ov: 09/08/17 Contract: No contract UDS: New screening due

## 2017-05-17 ENCOUNTER — Other Ambulatory Visit: Payer: BC Managed Care – PPO

## 2017-05-29 ENCOUNTER — Other Ambulatory Visit: Payer: BC Managed Care – PPO

## 2017-05-30 ENCOUNTER — Other Ambulatory Visit: Payer: Self-pay | Admitting: Family Medicine

## 2017-05-30 ENCOUNTER — Telehealth: Payer: Self-pay | Admitting: Family Medicine

## 2017-05-30 DIAGNOSIS — Z1231 Encounter for screening mammogram for malignant neoplasm of breast: Secondary | ICD-10-CM

## 2017-05-30 DIAGNOSIS — G4700 Insomnia, unspecified: Secondary | ICD-10-CM

## 2017-05-30 DIAGNOSIS — Z79899 Other long term (current) drug therapy: Secondary | ICD-10-CM

## 2017-05-30 NOTE — Telephone Encounter (Signed)
Copied from [place] [id number]. Topic: Inquiry >> May 30, 2017  9:09 AM Bea Graff, NT wrote: Reason for CRM: Patient is needing a procedure code for a urine drug screen. She states [employer] may not cover the lab to be done and she is needing this code in order to see if insurance will cover and needs to know which lab such at [facility] or [facility] will be used. Please advise.

## 2017-05-30 NOTE — Telephone Encounter (Signed)
I do not see orders for UDS.

## 2017-05-31 ENCOUNTER — Encounter: Payer: Self-pay | Admitting: Family Medicine

## 2017-06-01 ENCOUNTER — Encounter: Payer: Self-pay | Admitting: *Deleted

## 2017-06-01 NOTE — Telephone Encounter (Signed)
Advised patient of new laws and that we have changed who we do UDSs with.  She will be in tomorrow for UDS and sign contract.

## 2017-06-02 ENCOUNTER — Other Ambulatory Visit: Payer: BC Managed Care – PPO

## 2017-06-02 ENCOUNTER — Ambulatory Visit (INDEPENDENT_AMBULATORY_CARE_PROVIDER_SITE_OTHER): Payer: BC Managed Care – PPO

## 2017-06-02 DIAGNOSIS — Z79899 Other long term (current) drug therapy: Secondary | ICD-10-CM

## 2017-06-02 DIAGNOSIS — Z23 Encounter for immunization: Secondary | ICD-10-CM | POA: Diagnosis not present

## 2017-06-02 DIAGNOSIS — G4700 Insomnia, unspecified: Secondary | ICD-10-CM

## 2017-06-07 LAB — PAIN MGMT, PROFILE 8 W/CONF, U
6 Acetylmorphine: NEGATIVE ng/mL (ref <10)
ALCOHOL METABOLITES: NEGATIVE ng/mL (ref <500)
ALPHAHYDROXYMIDAZOLAM: NEGATIVE ng/mL (ref <50)
AMINOCLONAZEPAM: NEGATIVE ng/mL (ref <25)
Alphahydroxyalprazolam: 44 ng/mL — ABNORMAL HIGH (ref <25)
Alphahydroxytriazolam: NEGATIVE ng/mL (ref <50)
Amphetamines: NEGATIVE ng/mL (ref <500)
BENZODIAZEPINES: POSITIVE ng/mL — AB (ref <100)
BUPRENORPHINE, URINE: NEGATIVE ng/mL (ref <5)
COCAINE METABOLITE: NEGATIVE ng/mL (ref <150)
CREATININE: 22.9 mg/dL
HYDROXYETHYLFLURAZEPAM: NEGATIVE ng/mL (ref <50)
LORAZEPAM: NEGATIVE ng/mL (ref <50)
MARIJUANA METABOLITE: NEGATIVE ng/mL (ref <20)
MDMA: NEGATIVE ng/mL (ref <500)
Nordiazepam: NEGATIVE ng/mL (ref <50)
OXIDANT: NEGATIVE ug/mL (ref <200)
Opiates: NEGATIVE ng/mL (ref <100)
Oxazepam: NEGATIVE ng/mL (ref <50)
Oxycodone: NEGATIVE ng/mL (ref <100)
TEMAZEPAM: NEGATIVE ng/mL (ref <50)
pH: 6.26 (ref 4.5–9.0)

## 2017-06-15 ENCOUNTER — Other Ambulatory Visit: Payer: Self-pay | Admitting: Family Medicine

## 2017-06-15 DIAGNOSIS — F32A Depression, unspecified: Secondary | ICD-10-CM

## 2017-06-15 DIAGNOSIS — F419 Anxiety disorder, unspecified: Principal | ICD-10-CM

## 2017-06-15 DIAGNOSIS — F329 Major depressive disorder, single episode, unspecified: Secondary | ICD-10-CM

## 2017-06-16 NOTE — Telephone Encounter (Signed)
Requesting: Alprazolam Contract: Yes UDS: 1.25.2019 Low-risk; next screen 1.25.2020 Last OV:11.02.2018 Next OV: 5.17.2019 Last Refill: 1.04.2018   Please advise

## 2017-07-12 ENCOUNTER — Ambulatory Visit
Admission: RE | Admit: 2017-07-12 | Discharge: 2017-07-12 | Disposition: A | Discharge status: Home / Self Care | Payer: BC Managed Care – PPO | Source: Ambulatory Visit | Attending: Family Medicine | Admitting: Family Medicine

## 2017-07-12 DIAGNOSIS — Z1231 Encounter for screening mammogram for malignant neoplasm of breast: Secondary | ICD-10-CM

## 2017-09-08 ENCOUNTER — Encounter: Payer: BC Managed Care – PPO | Admitting: Family Medicine

## 2017-09-11 ENCOUNTER — Other Ambulatory Visit: Payer: Self-pay | Admitting: Family Medicine

## 2017-09-11 DIAGNOSIS — F32A Depression, unspecified: Secondary | ICD-10-CM

## 2017-09-11 DIAGNOSIS — F419 Anxiety disorder, unspecified: Principal | ICD-10-CM

## 2017-09-11 DIAGNOSIS — F329 Major depressive disorder, single episode, unspecified: Secondary | ICD-10-CM

## 2017-09-12 NOTE — Telephone Encounter (Signed)
Database ran and is on your desk for review.  Last filled per database:08/13/17 Last written: 06/16/17 Last ov: 03/10/17 Next ov: 09/22/17 Contract: 06/02/18 UDS: 06/02/18

## 2017-09-22 ENCOUNTER — Ambulatory Visit (INDEPENDENT_AMBULATORY_CARE_PROVIDER_SITE_OTHER): Payer: BC Managed Care – PPO | Admitting: Family Medicine

## 2017-09-22 ENCOUNTER — Encounter: Payer: Self-pay | Admitting: Family Medicine

## 2017-09-22 VITALS — BP 129/78 | HR 70 | Temp 98.4°F | Resp 16 | Ht 66.0 in | Wt 205.0 lb

## 2017-09-22 DIAGNOSIS — E039 Hypothyroidism, unspecified: Secondary | ICD-10-CM

## 2017-09-22 DIAGNOSIS — K649 Unspecified hemorrhoids: Secondary | ICD-10-CM | POA: Diagnosis not present

## 2017-09-22 DIAGNOSIS — Z0000 Encounter for general adult medical examination without abnormal findings: Secondary | ICD-10-CM

## 2017-09-22 DIAGNOSIS — N898 Other specified noninflammatory disorders of vagina: Secondary | ICD-10-CM | POA: Diagnosis not present

## 2017-09-22 LAB — COMPREHENSIVE METABOLIC PANEL
ALT: 17 U/L (ref 0–35)
AST: 16 U/L (ref 0–37)
Albumin: 4.5 g/dL (ref 3.5–5.2)
Alkaline Phosphatase: 97 U/L (ref 39–117)
BUN: 17 mg/dL (ref 6–23)
CO2: 29 meq/L (ref 19–32)
CREATININE: 0.82 mg/dL (ref 0.40–1.20)
Calcium: 9.7 mg/dL (ref 8.4–10.5)
Chloride: 105 mEq/L (ref 96–112)
GFR: 75.82 mL/min (ref >60.00)
Glucose, Bld: 93 mg/dL (ref 70–99)
POTASSIUM: 4.5 meq/L (ref 3.5–5.1)
SODIUM: 141 meq/L (ref 135–145)
Total Bilirubin: 0.8 mg/dL (ref 0.2–1.2)
Total Protein: 7.2 g/dL (ref 6.0–8.3)

## 2017-09-22 LAB — LIPID PANEL
CHOL/HDL RATIO: 3
CHOLESTEROL: 168 mg/dL (ref 0–200)
HDL: 59.4 mg/dL (ref >39.00)
LDL CALC: 96 mg/dL (ref 0–99)
NonHDL: 108.72
TRIGLYCERIDES: 66 mg/dL (ref 0.0–149.0)
VLDL: 13.2 mg/dL (ref 0.0–40.0)

## 2017-09-22 LAB — CBC WITH DIFFERENTIAL/PLATELET
BASOS ABS: 0.1 10*3/uL (ref 0.0–0.1)
Basophils Relative: 0.8 % (ref 0.0–3.0)
EOS ABS: 0.4 10*3/uL (ref 0.0–0.7)
Eosinophils Relative: 5.3 % — ABNORMAL HIGH (ref 0.0–5.0)
HEMATOCRIT: 40.6 % (ref 36.0–46.0)
HEMOGLOBIN: 13.4 g/dL (ref 12.0–15.0)
LYMPHS PCT: 23.8 % (ref 12.0–46.0)
Lymphs Abs: 1.6 10*3/uL (ref 0.7–4.0)
MCHC: 33 g/dL (ref 30.0–36.0)
MCV: 87.8 fl (ref 78.0–100.0)
MONO ABS: 0.4 10*3/uL (ref 0.1–1.0)
Monocytes Relative: 6.2 % (ref 3.0–12.0)
Neutro Abs: 4.4 10*3/uL (ref 1.4–7.7)
Neutrophils Relative %: 63.9 % (ref 43.0–77.0)
Platelets: 271 10*3/uL (ref 150.0–400.0)
RBC: 4.63 Mil/uL (ref 3.87–5.11)
RDW: 13.7 % (ref 11.5–15.5)
WBC: 6.8 10*3/uL (ref 4.0–10.5)

## 2017-09-22 LAB — TSH: TSH: 1.65 u[IU]/mL (ref 0.35–4.50)

## 2017-09-22 MED ORDER — HYDROCORTISONE ACE-PRAMOXINE 1-1 % RE FOAM: 1.0000 | Freq: Two times a day (BID) | RECTAL | 0 refills | Status: DC

## 2017-09-22 MED ORDER — CLOTRIMAZOLE 1 % EX CREA: 1.0000 "application " | TOPICAL_CREAM | Freq: Two times a day (BID) | CUTANEOUS | 0 refills | Status: DC

## 2017-09-22 NOTE — Progress Notes (Signed)
Subjective:  I acted as a Education administrator for [employer]. Yancey Flemings, [place]   Patient ID: Norma Kelley, female    DOB: 08/25/1960, 57 y.o.   MRN: [id number]  Chief Complaint  Patient presents with  . Annual Exam    HPI  Patient is in today for annual exam.  Pt c/o vaginal and rectal itching -- no vaginal d/c   No other complaints  Patient Care Team: Carollee Herter, Alferd Apa, DO as PCP - General   Past Medical History:  Diagnosis Date  . Anxiety   . Breast cancer ([place]) 2007   left breast cancer  . Diet-controlled diabetes mellitus ([place])   . GERD (gastroesophageal reflux disease)   . Hypothyroidism   . Thyroid disease     Past Surgical History:  Procedure Laterality Date  . ABDOMINAL HYSTERECTOMY    . BACK SURGERY  2013   lumbar  . 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 BREAST LUMPECTOMY WITH RADIOACTIVE SEED LOCALIZATION Right 08/17/2015   Procedure: BREAST LUMPECTOMY WITH RADIOACTIVE SEED LOCALIZATION;  Surgeon: Excell Seltzer, MD;  Location: [facility];  Service: General;  Laterality: Right;  . BREAST SURGERY     Mastectomy and reconstruction  . MASTECTOMY Left 2007   mastectomy and tamoxifen  . TONSILLECTOMY      Family History  Problem Relation Age of Onset  . Hyperlipidemia Mother   . Osteoporosis Mother   . Diabetes Mother   . Heart attack Mother 76  . Breast cancer Mother 34  . Heart attack Father 39       MASSIVE MI,  Died age 11  . Colon cancer Maternal Grandfather        dx in late 60s-early 70s  . Head & neck cancer Maternal Grandmother        cancer of the sinuses  . Pancreatic cancer Neg Hx   . Stomach cancer Neg Hx   . Rectal cancer Neg Hx     Social History   Socioeconomic History  . Marital status: Married    Spouse name: Not on file  . Number of children: 3  . Years of education: Not on file  . Highest education level: Not on file  Occupational History    Employer: [employer]  . Financial resource  strain: Not on file  . Food insecurity:    Worry: Not on file    Inability: Not on file  . Transportation needs:    Medical: Not on file    Non-medical: Not on file  Tobacco Use  . Smoking status: Former Smoker    Packs/day: 1.00    Years: 20.00    Pack years: 20.00    Types: Cigarettes  . Smokeless tobacco: Never Used  . Tobacco comment: Quit 14 years ago  Substance and Sexual Activity  . Alcohol use: No  . Drug use: No  . Sexual activity: Yes  Lifestyle  . Physical activity:    Days per week: Not on file    Minutes per session: Not on file  . Stress: Not on file  Relationships  . Social connections:    Talks on phone: Not on file    Gets together: Not on file    Attends religious service: Not on file    Active member of club or organization: Not on file    Attends meetings of clubs or organizations: Not on file    Relationship status: Not on file  .  Intimate partner violence:    Fear of current or ex partner: Not on file    Emotionally abused: Not on file    Physically abused: Not on file    Forced sexual activity: Not on file  Other Topics Concern  . Not on file  Social History Narrative   Lives with husband.  Occasional exercise.    Outpatient Medications Prior to Visit  Medication Sig Dispense Refill  . ALPRAZolam (XANAX) 0.25 MG tablet TAKE 1 TABLET BY MOUTH THREE TIMES DAILY AS NEEDED 60 tablet 0  . aspirin 81 MG tablet Take 81 mg by mouth daily.    . cetirizine (ZYRTEC) 10 MG tablet Take 10 mg by mouth daily.    Marland Kitchen desonide (DESOWEN) 0.05 % ointment APP EXT AA BID FOR 14 DAYS  0  . levothyroxine (SYNTHROID, LEVOTHROID) 100 MCG tablet Take 1 tablet (100 mcg total) by mouth daily. 30 tablet 5  . mometasone (ELOCON) 0.1 % cream APPLY TO THE AFFECTED AREA AS NEEDED FOR DRY RASH 45 g 1  . Omeprazole ([facility] OMEPRAZOLE) 20 MG TBEC Take 1 tablet by mouth daily.     . valACYclovir (VALTREX) 500 MG tablet TAKE 1 TABLET BY MOUTH EVERY DAY AS NEEDED 30 tablet 0   No  facility-administered medications prior to visit.     Allergies  Allergen Reactions  . Sulfamethoxazole Itching    Swelling around eyes  . Sulfonamide Derivatives     Review of Systems  Constitutional: Negative for chills, fever and malaise/fatigue.  HENT: Negative for congestion and hearing loss.   Eyes: Negative for blurred vision and discharge.  Respiratory: Negative for cough, sputum production and shortness of breath.   Cardiovascular: Negative for chest pain, palpitations and leg swelling.  Gastrointestinal: Negative for abdominal pain, blood in stool, constipation, diarrhea, heartburn, nausea and vomiting.  Genitourinary: Negative for dysuria, frequency, hematuria and urgency.  Musculoskeletal: Negative for back pain, falls and myalgias.  Skin: Negative for rash.  Neurological: Negative for dizziness, sensory change, loss of consciousness, weakness and headaches.  Endo/Heme/Allergies: Negative for environmental allergies. Does not bruise/bleed easily.  Psychiatric/Behavioral: Negative for depression and suicidal ideas. The patient is not nervous/anxious and does not have insomnia.        Objective:    Physical Exam  Constitutional: She is oriented to person, place, and time. She appears well-developed and well-nourished. No distress.  HENT:  Head: Normocephalic and atraumatic.  Right Ear: External ear normal.  Left Ear: External ear normal.  Nose: Nose normal.  Mouth/Throat: Oropharynx is clear and moist.  Eyes: Pupils are equal, round, and reactive to light. Conjunctivae and EOM are normal. Right eye exhibits no discharge. Left eye exhibits no discharge.  Neck: Normal range of motion. Neck supple. No JVD present. No thyromegaly present.  Cardiovascular: Normal rate, regular rhythm, normal heart sounds and intact distal pulses.  No murmur heard. Pulmonary/Chest: Effort normal and breath sounds normal. No respiratory distress. She has no wheezes. She has no rales. She  exhibits no tenderness.  Abdominal: Soft. Bowel sounds are normal. She exhibits no distension and no mass. There is no tenderness. There is no rebound and no guarding.  Genitourinary: Rectal exam shows guaiac negative stool. No vaginal discharge found.  Musculoskeletal: Normal range of motion. She exhibits no edema or tenderness.  Lymphadenopathy:    She has no cervical adenopathy.  Neurological: She is alert and oriented to person, place, and time. She has normal reflexes. No cranial nerve deficit.  Skin: Skin  is warm and dry. No rash noted. She is not diaphoretic. No erythema.  Psychiatric: She has a normal mood and affect. Her behavior is normal. Judgment and thought content normal.  Nursing note and vitals reviewed.   BP 129/78 (BP Location: Left Arm, Patient Position: Sitting, Cuff Size: Large)   Pulse 70   Temp 98.4 F (36.9 C) (Oral)   Resp 16   Ht 5\' 6"  (1.676 m)   Wt 205 lb (93 kg)   SpO2 99%   BMI 33.09 kg/m  Wt Readings from Last 3 Encounters:  09/22/17 205 lb (93 kg)  03/10/17 205 lb 12.8 oz (93.4 kg)  12/27/16 202 lb (91.6 kg)   BP Readings from Last 3 Encounters:  09/22/17 129/78  03/10/17 120/78  12/27/16 128/78     Immunization History  Administered Date(s) Administered  . Influenza,inj,Quad PF,6+ Mos 02/20/2013, 03/10/2017  . Influenza-Unspecified 02/26/2014, 02/07/2015, 02/07/2016  . Tdap 12/20/2012  . Zoster Recombinat (Shingrix) 03/10/2017, 06/02/2017    Health Maintenance  Topic Date Due  . INFLUENZA VACCINE  12/07/2017  . COLONOSCOPY  11/15/2018  . MAMMOGRAM  07/13/2019  . TETANUS/TDAP  12/21/2022  . Hepatitis C Screening  Completed  . HIV Screening  Completed    Lab Results  Component Value Date   WBC 6.8 09/22/2017   HGB 13.4 09/22/2017   HCT 40.6 09/22/2017   PLT 271.0 09/22/2017   GLUCOSE 93 09/22/2017   CHOL 168 09/22/2017   TRIG 66.0 09/22/2017   HDL 59.40 09/22/2017   LDLCALC 96 09/22/2017   ALT 17 09/22/2017   AST 16  09/22/2017   NA 141 09/22/2017   K 4.5 09/22/2017   CL 105 09/22/2017   CREATININE 0.82 09/22/2017   BUN 17 09/22/2017   CO2 29 09/22/2017   TSH 1.65 09/22/2017   HGBA1C 6.0 03/10/2017   MICROALBUR 0.4 07/16/2012    Lab Results  Component Value Date   TSH 1.65 09/22/2017   Lab Results  Component Value Date   WBC 6.8 09/22/2017   HGB 13.4 09/22/2017   HCT 40.6 09/22/2017   MCV 87.8 09/22/2017   PLT 271.0 09/22/2017   Lab Results  Component Value Date   NA 141 09/22/2017   K 4.5 09/22/2017   CO2 29 09/22/2017   GLUCOSE 93 09/22/2017   BUN 17 09/22/2017   CREATININE 0.82 09/22/2017   BILITOT 0.8 09/22/2017   ALKPHOS 97 09/22/2017   AST 16 09/22/2017   ALT 17 09/22/2017   PROT 7.2 09/22/2017   ALBUMIN 4.5 09/22/2017   CALCIUM 9.7 09/22/2017   ANIONGAP 9 09/10/2015   GFR 75.82 09/22/2017   Lab Results  Component Value Date   CHOL 168 09/22/2017   Lab Results  Component Value Date   HDL 59.40 09/22/2017   Lab Results  Component Value Date   LDLCALC 96 09/22/2017   Lab Results  Component Value Date   TRIG 66.0 09/22/2017   Lab Results  Component Value Date   CHOLHDL 3 09/22/2017   Lab Results  Component Value Date   HGBA1C 6.0 03/10/2017         Assessment & Plan:   Problem List Items Addressed This Visit      Unprioritized   Hemorrhoids    See orders        Relevant Medications   hydrocortisone-pramoxine (PROCTOFOAM HC) rectal foam   Hypothyroidism   Relevant Orders   TSH (Completed)   Preventative health care - Primary    ghm  utd Check labs See AVS      Relevant Orders   CBC with Differential/Platelet (Completed)   Lipid panel (Completed)   TSH (Completed)   Comprehensive metabolic panel (Completed)   Vaginal itching    Cream per orders Vulvar candida-- no d/c       Relevant Medications   clotrimazole (LOTRIMIN) 1 % cream      I am having Najia A. Samford start on hydrocortisone-pramoxine and clotrimazole. I am also  having her maintain her Omeprazole, aspirin, cetirizine, desonide, mometasone, levothyroxine, valACYclovir, and ALPRAZolam.  Meds ordered this encounter  Medications  . hydrocortisone-pramoxine (PROCTOFOAM HC) rectal foam    Sig: Place 1 applicator rectally 2 (two) times daily.    Dispense:  10 g    Refill:  0  . clotrimazole (LOTRIMIN) 1 % cream    Sig: Apply 1 application topically 2 (two) times daily.    Dispense:  30 g    Refill:  0    CMA served as scribe during this visit. History, Physical and Plan performed by medical provider. Documentation and orders reviewed and attested to.  Ann Held, DO

## 2017-09-22 NOTE — Patient Instructions (Signed)
Preventive Care 40-64 Years, Female Preventive care refers to lifestyle choices and visits with your health care provider that can promote health and wellness. What does preventive care include?  A yearly physical exam. This is also called an annual well check.  Dental exams once or twice a year.  Routine eye exams. Ask your health care provider how often you should have your eyes checked.  Personal lifestyle choices, including: ? Daily care of your teeth and gums. ? Regular physical activity. ? Eating a healthy diet. ? Avoiding tobacco and drug use. ? Limiting alcohol use. ? Practicing safe sex. ? Taking low-dose aspirin daily starting at age 58. ? Taking vitamin and mineral supplements as recommended by your health care provider. What happens during an annual well check? The services and screenings done by your health care provider during your annual well check will depend on your age, overall health, lifestyle risk factors, and family history of disease. Counseling Your health care provider may ask you questions about your:  Alcohol use.  Tobacco use.  Drug use.  Emotional well-being.  Home and relationship well-being.  Sexual activity.  Eating habits.  Work and work Statistician.  Method of birth control.  Menstrual cycle.  Pregnancy history.  Screening You may have the following tests or measurements:  Height, weight, and BMI.  Blood pressure.  Lipid and cholesterol levels. These may be checked every 5 years, or more frequently if you are over 81 years old.  Skin check.  Lung cancer screening. You may have this screening every year starting at age 78 if you have a 30-pack-year history of smoking and currently smoke or have quit within the past 15 years.  Fecal occult blood test (FOBT) of the stool. You may have this test every year starting at age 65.  Flexible sigmoidoscopy or colonoscopy. You may have a sigmoidoscopy every 5 years or a colonoscopy  every 10 years starting at age 30.  Hepatitis C blood test.  Hepatitis B blood test.  Sexually transmitted disease (STD) testing.  Diabetes screening. This is done by checking your blood sugar (glucose) after you have not eaten for a while (fasting). You may have this done every 1-3 years.  Mammogram. This may be done every 1-2 years. Talk to your health care provider about when you should start having regular mammograms. This may depend on whether you have a family history of breast cancer.  BRCA-related cancer screening. This may be done if you have a family history of breast, ovarian, tubal, or peritoneal cancers.  Pelvic exam and Pap test. This may be done every 3 years starting at age 80. Starting at age 36, this may be done every 5 years if you have a Pap test in combination with an HPV test.  Bone density scan. This is done to screen for osteoporosis. You may have this scan if you are at high risk for osteoporosis.  Discuss your test results, treatment options, and if necessary, the need for more tests with your health care provider. Vaccines Your health care provider may recommend certain vaccines, such as:  Influenza vaccine. This is recommended every year.  Tetanus, diphtheria, and acellular pertussis (Tdap, Td) vaccine. You may need a Td booster every 10 years.  Varicella vaccine. You may need this if you have not been vaccinated.  Zoster vaccine. You may need this after age 5.  Measles, mumps, and rubella (MMR) vaccine. You may need at least one dose of MMR if you were born in  1957 or later. You may also need a second dose.  Pneumococcal 13-valent conjugate (PCV13) vaccine. You may need this if you have certain conditions and were not previously vaccinated.  Pneumococcal polysaccharide (PPSV23) vaccine. You may need one or two doses if you smoke cigarettes or if you have certain conditions.  Meningococcal vaccine. You may need this if you have certain  conditions.  Hepatitis A vaccine. You may need this if you have certain conditions or if you travel or work in places where you may be exposed to hepatitis A.  Hepatitis B vaccine. You may need this if you have certain conditions or if you travel or work in places where you may be exposed to hepatitis B.  Haemophilus influenzae type b (Hib) vaccine. You may need this if you have certain conditions.  Talk to your health care provider about which screenings and vaccines you need and how often you need them. This information is not intended to replace advice given to you by your health care provider. Make sure you discuss any questions you have with your health care provider. Document Released: 05/22/2015 Document Revised: 01/13/2016 Document Reviewed: 02/24/2015 Elsevier Interactive Patient Education  2018 Elsevier Inc.  

## 2017-09-23 DIAGNOSIS — Z0000 Encounter for general adult medical examination without abnormal findings: Secondary | ICD-10-CM | POA: Insufficient documentation

## 2017-09-23 DIAGNOSIS — K649 Unspecified hemorrhoids: Secondary | ICD-10-CM | POA: Insufficient documentation

## 2017-09-23 DIAGNOSIS — N898 Other specified noninflammatory disorders of vagina: Secondary | ICD-10-CM | POA: Insufficient documentation

## 2017-09-23 NOTE — Assessment & Plan Note (Signed)
ghm utd Check labs See AVS 

## 2017-09-23 NOTE — Assessment & Plan Note (Addendum)
Cream per orders Vulvar candida-- no d/c

## 2017-09-23 NOTE — Assessment & Plan Note (Signed)
See order(s).

## 2017-09-30 ENCOUNTER — Encounter (HOSPITAL_COMMUNITY): Payer: Self-pay | Admitting: Oncology

## 2017-10-16 ENCOUNTER — Other Ambulatory Visit: Payer: Self-pay | Admitting: Family Medicine

## 2017-10-16 DIAGNOSIS — F419 Anxiety disorder, unspecified: Principal | ICD-10-CM

## 2017-10-16 DIAGNOSIS — F329 Major depressive disorder, single episode, unspecified: Secondary | ICD-10-CM

## 2017-10-17 NOTE — Telephone Encounter (Signed)
Requesting: XANAX 0.25 MG Contract: 06/02/17 UDS:1/25/19Low risk Last OV: 09/22/17 Next OV: - Last Refill: 09/13/17   Please advise

## 2017-10-23 ENCOUNTER — Other Ambulatory Visit: Payer: Self-pay | Admitting: Family Medicine

## 2017-11-10 ENCOUNTER — Other Ambulatory Visit: Payer: Self-pay | Admitting: Family Medicine

## 2017-11-10 DIAGNOSIS — F419 Anxiety disorder, unspecified: Principal | ICD-10-CM

## 2017-11-10 DIAGNOSIS — F329 Major depressive disorder, single episode, unspecified: Secondary | ICD-10-CM

## 2017-11-10 NOTE — Telephone Encounter (Signed)
Discussed with pharmacist. Since last rx 20 days script can refill.

## 2017-11-10 NOTE — Telephone Encounter (Signed)
Norma Kelley -- please advise in PCP's absence?  Last alprazolam RX: 10/17/17, #60 x no refills. Last OV: 09/22/17 Next OV: due in 1 yr but not yet scheduled UDS: 06/02/17, low risk. Repeat 1 yr CSC: 06/02/17 CSR: Database shows last fill date of 09/13/17 but I confirmed with pharmacy that Rx was filled and picked up on 10/17/17.  No other discrepancies identified.

## 2017-12-07 ENCOUNTER — Other Ambulatory Visit: Payer: Self-pay | Admitting: Medical

## 2017-12-07 DIAGNOSIS — F419 Anxiety disorder, unspecified: Principal | ICD-10-CM

## 2017-12-07 DIAGNOSIS — F329 Major depressive disorder, single episode, unspecified: Secondary | ICD-10-CM

## 2017-12-07 DIAGNOSIS — F32A Depression, unspecified: Secondary | ICD-10-CM

## 2017-12-08 NOTE — Telephone Encounter (Signed)
Refill Request: Alprazolam  Last RX:12/08/17 Last OV:09/22/17 Next PZ:XAQW scheduled  UDS: 06/02/17 CSC: 06/02/17

## 2018-01-03 ENCOUNTER — Ambulatory Visit: Payer: Self-pay | Admitting: Family Medicine

## 2018-01-03 NOTE — Telephone Encounter (Signed)
Patient is calling to reports she removed a tiny little tick today- too tiny to tell the type. Patient has tick is plastic bag. She is going to clean area with soap and water. She was given home care instructions and she will call back with any signs of infection or problems.  Reason for Disposition . Tick bite with no complications  Answer Assessment - Initial Assessment Questions 1. TYPE of TICK: "Is it a wood tick or a deer tick?" If unsure, ask: "What size was the tick?" "Did it look more like a watermelon seed or a poppy seed?"      Tiny little tick- black- too tiny to tell what type 2. LOCATION: "Where is the tick bite located?"     forearm- right  3. ONSET: "How long do you think the tick was attached before you removed it?" (Hours or days)      Unsure- possibly few hours 4. TETANUS: "When was the last tetanus booster?"      Up to date 5. PREGNANCY: "Is there any chance you are pregnant?" "When was your last menstrual period?"     n/a  Protocols used: TICK BITE-A-AH

## 2018-01-14 ENCOUNTER — Other Ambulatory Visit: Payer: Self-pay | Admitting: Family Medicine

## 2018-01-14 DIAGNOSIS — F329 Major depressive disorder, single episode, unspecified: Secondary | ICD-10-CM

## 2018-01-14 DIAGNOSIS — F419 Anxiety disorder, unspecified: Principal | ICD-10-CM

## 2018-01-14 DIAGNOSIS — F32A Depression, unspecified: Secondary | ICD-10-CM

## 2018-01-16 NOTE — Telephone Encounter (Signed)
Database ran and is on your desk for review.  Last filled per database: 12/08/17 Last written: 12/08/17 Last ov: 09/22/17 Next ov: none Contract: 06/02/18 UDS: 06/02/18

## 2018-02-10 ENCOUNTER — Other Ambulatory Visit: Payer: Self-pay | Admitting: Family Medicine

## 2018-02-10 DIAGNOSIS — F329 Major depressive disorder, single episode, unspecified: Secondary | ICD-10-CM

## 2018-02-10 DIAGNOSIS — F419 Anxiety disorder, unspecified: Principal | ICD-10-CM

## 2018-02-15 ENCOUNTER — Other Ambulatory Visit: Payer: Self-pay | Admitting: Family Medicine

## 2018-02-15 DIAGNOSIS — F419 Anxiety disorder, unspecified: Principal | ICD-10-CM

## 2018-02-15 DIAGNOSIS — F32A Depression, unspecified: Secondary | ICD-10-CM

## 2018-02-15 DIAGNOSIS — F329 Major depressive disorder, single episode, unspecified: Secondary | ICD-10-CM

## 2018-02-15 NOTE — Telephone Encounter (Signed)
Pt is requesting refill on alprazolam.   Last OV: 09/22/2017, no future appt scheduled Last Fill: 01/16/2018 #60 and 0RF UDS: 06/02/2017 Low risk

## 2018-02-18 MED ORDER — ALPRAZOLAM 0.25 MG PO TABS: 0.2500 mg | ORAL_TABLET | Freq: Three times a day (TID) | ORAL | 0 refills | Status: DC | PRN

## 2018-03-13 ENCOUNTER — Telehealth: Payer: Self-pay | Admitting: *Deleted

## 2018-03-13 NOTE — Telephone Encounter (Signed)
Received Dermatopathology Report results from [facility]; forwarded to provider/SLS 11/05

## 2018-03-22 ENCOUNTER — Other Ambulatory Visit: Payer: Self-pay | Admitting: Family Medicine

## 2018-03-22 DIAGNOSIS — F32A Depression, unspecified: Secondary | ICD-10-CM

## 2018-03-22 DIAGNOSIS — F419 Anxiety disorder, unspecified: Principal | ICD-10-CM

## 2018-03-22 DIAGNOSIS — F329 Major depressive disorder, single episode, unspecified: Secondary | ICD-10-CM

## 2018-03-26 NOTE — Telephone Encounter (Signed)
Database ran on 01/16/18   Last written: 02/18/18 Last ov: 09/22/17 Next ov: none Contract: 06/02/18 UDS: 06/02/18

## 2018-04-12 ENCOUNTER — Encounter: Payer: Self-pay | Admitting: Family Medicine

## 2018-04-12 ENCOUNTER — Ambulatory Visit: Payer: BC Managed Care – PPO | Admitting: Family Medicine

## 2018-04-12 VITALS — BP 130/80 | HR 72 | Temp 98.6°F | Wt 201.6 lb

## 2018-04-12 DIAGNOSIS — B349 Viral infection, unspecified: Secondary | ICD-10-CM | POA: Diagnosis not present

## 2018-04-12 DIAGNOSIS — M545 Low back pain, unspecified: Secondary | ICD-10-CM

## 2018-04-12 DIAGNOSIS — J069 Acute upper respiratory infection, unspecified: Secondary | ICD-10-CM | POA: Insufficient documentation

## 2018-04-12 LAB — POC URINALSYSI DIPSTICK (AUTOMATED)
BILIRUBIN UA: NEGATIVE
Glucose, UA: NEGATIVE
Ketones, UA: NEGATIVE
Leukocytes, UA: NEGATIVE
NITRITE UA: NEGATIVE
PH UA: 6 (ref 5.0–8.0)
Protein, UA: NEGATIVE
RBC UA: NEGATIVE
Spec Grav, UA: 1.01 (ref 1.010–1.025)
Urobilinogen, UA: 0.2 E.U./dL

## 2018-04-12 NOTE — Progress Notes (Signed)
Norma Kelley - 57 y.o. female MRN [id number]  Date of birth: November 16, 1960  Subjective Chief Complaint  Patient presents with  . Back Pain    HPI Norma Kelley is a 57 y.o. female here today with complaint of body aches, chills, low back pain, cough and nasal congestion.  Her symptoms began about 4 days ago.  Symptoms seem to be improving but she continues to have back pain.  She is concerned about possible kidney infection as she had back pain with previous infection.  She denies dysuria, fever, chills, hematuria or urgency.  She has been using OTC medications for pain control and congestion.  She is drinking a good amount of fluids.    ROS:  A comprehensive ROS was completed and negative except as noted per HPI  Allergies  Allergen Reactions  . Sulfamethoxazole Itching    Swelling around eyes  . Sulfonamide Derivatives     Past Medical History:  Diagnosis Date  . Anxiety   . Breast cancer ([place]) 2007   left breast cancer  . Diet-controlled diabetes mellitus ([place])   . GERD (gastroesophageal reflux disease)   . Hypothyroidism   . Thyroid disease     Past Surgical History:  Procedure Laterality Date  . ABDOMINAL HYSTERECTOMY    . BACK SURGERY  2013   lumbar  . 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 BREAST LUMPECTOMY WITH RADIOACTIVE SEED LOCALIZATION Right 08/17/2015   Procedure: BREAST LUMPECTOMY WITH RADIOACTIVE SEED LOCALIZATION;  Surgeon: Excell Seltzer, MD;  Location: [facility];  Service: General;  Laterality: Right;  . BREAST SURGERY     Mastectomy and reconstruction  . MASTECTOMY Left 2007   mastectomy and tamoxifen  . TONSILLECTOMY      Social History   Socioeconomic History  . Marital status: Married    Spouse name: Not on file  . Number of children: 3  . Years of education: Not on file  . Highest education level: Not on file  Occupational History    Employer: [employer]  Social Needs  . Financial resource strain: Not  on file  . Food insecurity:    Worry: Not on file    Inability: Not on file  . Transportation needs:    Medical: Not on file    Non-medical: Not on file  Tobacco Use  . Smoking status: Former Smoker    Packs/day: 1.00    Years: 20.00    Pack years: 20.00    Types: Cigarettes  . Smokeless tobacco: Never Used  . Tobacco comment: Quit 14 years ago  Substance and Sexual Activity  . Alcohol use: No  . Drug use: No  . Sexual activity: Yes  Lifestyle  . Physical activity:    Days per week: Not on file    Minutes per session: Not on file  . Stress: Not on file  Relationships  . Social connections:    Talks on phone: Not on file    Gets together: Not on file    Attends religious service: Not on file    Active member of club or organization: Not on file    Attends meetings of clubs or organizations: Not on file    Relationship status: Not on file  Other Topics Concern  . Not on file  Social History Narrative   Lives with husband.  Occasional exercise.    Family History  Problem Relation Age of Onset  . Hyperlipidemia Mother   . Osteoporosis  Mother   . Diabetes Mother   . Heart attack Mother 57  . Breast cancer Mother 25  . Heart attack Father 70       MASSIVE MI,  Died age 72  . Colon cancer Maternal Grandfather        dx in late 60s-early 70s  . Head & neck cancer Maternal Grandmother        cancer of the sinuses  . Pancreatic cancer Neg Hx   . Stomach cancer Neg Hx   . Rectal cancer Neg Hx     Health Maintenance  Topic Date Due  . COLONOSCOPY  11/15/2018  . MAMMOGRAM  07/13/2019  . TETANUS/TDAP  12/21/2022  . INFLUENZA VACCINE  Completed  . Hepatitis C Screening  Completed  . HIV Screening  Completed    ----------------------------------------------------------------------------------------------------------------------------------------------------------------------------------------------------------------- Physical Exam BP 130/80   Pulse 72   Temp 98.6  F (37 C)   Wt 201 lb 9.6 oz (91.4 kg)   SpO2 99%   BMI 32.54 kg/m   Physical Exam  Constitutional: She is oriented to person, place, and time. She appears well-nourished. No distress.  HENT:  Head: Normocephalic and atraumatic.  Mouth/Throat: Oropharynx is clear and moist.  Eyes: No scleral icterus.  Subconjunctival hemorrhage of R eye.   Neck: Neck supple. No thyromegaly present.  Cardiovascular: Normal rate, regular rhythm and normal heart sounds.  Pulmonary/Chest: Effort normal and breath sounds normal.  Abdominal:  No CVA tenderness   Lymphadenopathy:    She has no cervical adenopathy.  Neurological: She is alert and oriented to person, place, and time.  Skin: Skin is warm and dry.  Psychiatric: She has a normal mood and affect. Her behavior is normal.    ------------------------------------------------------------------------------------------------------------------------------------------------------------------------------------------------------------------- Assessment and Plan  No problem-specific Assessment & Plan notes found for this encounter.

## 2018-04-12 NOTE — Patient Instructions (Signed)
Upper Respiratory Infection, Adult Most upper respiratory infections (URIs) are caused by a virus. A URI affects the nose, throat, and upper air passages. The most common type of URI is often called "the common cold." Follow these instructions at home:  Take medicines only as told by your doctor.  Gargle warm saltwater or take cough drops to comfort your throat as told by your doctor.  Use a warm mist humidifier or inhale steam from a shower to increase air moisture. This may make it easier to breathe.  Drink enough fluid to keep your pee (urine) clear or pale yellow.  Eat soups and other clear broths.  Have a healthy diet.  Rest as needed.  Go back to work when your fever is gone or your doctor says it is okay. ? You may need to stay home longer to avoid giving your URI to others. ? You can also wear a face mask and wash your hands often to prevent spread of the virus.  Use your inhaler more if you have asthma.  Do not use any tobacco products, including cigarettes, chewing tobacco, or electronic cigarettes. If you need help quitting, ask your doctor. Contact a doctor if:  You are getting worse, not better.  Your symptoms are not helped by medicine.  You have chills.  You are getting more short of breath.  You have brown or red mucus.  You have yellow or brown discharge from your nose.  You have pain in your face, especially when you bend forward.  You have a fever.  You have puffy (swollen) neck glands.  You have pain while swallowing.  You have white areas in the back of your throat. Get help right away if:  You have very bad or constant: ? Headache. ? Ear pain. ? Pain in your forehead, behind your eyes, and over your cheekbones (sinus pain). ? Chest pain.  You have long-lasting (chronic) lung disease and any of the following: ? Wheezing. ? Long-lasting cough. ? Coughing up blood. ? A change in your usual mucus.  You have a stiff neck.  You have  changes in your: ? Vision. ? Hearing. ? Thinking. ? Mood. This information is not intended to replace advice given to you by your health care provider. Make sure you discuss any questions you have with your health care provider. Document Released: 10/12/2007 Document Revised: 12/27/2015 Document Reviewed: 07/31/2013 Elsevier Interactive Patient Education  2018 [employer].   Subconjunctival Hemorrhage Subconjunctival hemorrhage is bleeding that happens between the white part of your eye (sclera) and the clear membrane that covers the outside of your eye (conjunctiva). There are many tiny blood vessels near the surface of your eye. A subconjunctival hemorrhage happens when one or more of these vessels breaks and bleeds, causing a red patch to appear on your eye. This is similar to a bruise. Depending on the amount of bleeding, the red patch may only cover a small area of your eye or it may cover the entire visible part of the sclera. If a lot of blood collects under the conjunctiva, there may also be swelling. Subconjunctival hemorrhages do not affect your vision or cause pain, but your eye may feel irritated if there is swelling. Subconjunctival hemorrhages usually do not require treatment, and they disappear on their own within two weeks. What are the causes? This condition may be caused by:  Mild trauma, such as rubbing your eye too hard.  Severe trauma or blunt injuries.  Coughing, sneezing, or vomiting.  Straining, such as when lifting a heavy object.  High blood pressure.  Recent eye surgery.  A history of diabetes.  Certain medicines, especially blood thinners (anticoagulants).  Other conditions, such as eye tumors, bleeding disorders, or blood vessel abnormalities.  Subconjunctival hemorrhages can happen without an obvious cause. What are the signs or symptoms? Symptoms of this condition include:  A bright red or dark red patch on the white part of the eye. ? The red  area may spread out to cover a larger area of the eye before it goes away. ? The red area may turn brownish-yellow before it goes away.  Swelling.  Mild eye irritation.  How is this diagnosed? This condition is diagnosed with a physical exam. If your subconjunctival hemorrhage was caused by trauma, your health care provider may refer you to an eye specialist (ophthalmologist) or another specialist to check for other injuries. You may have other tests, including:  An eye exam.  A blood pressure check.  Blood tests to check for bleeding disorders.  If your subconjunctival hemorrhage was caused by trauma, X-rays or a CT scan may be done to check for other injuries. How is this treated? Usually, no treatment is needed. Your health care provider may recommend eye drops or cold compresses to help with discomfort. Follow these instructions at home:  Take over-the-counter and prescription medicines only as directed by your health care provider.  Use eye drops or cold compresses to help with discomfort as directed by your health care provider.  Avoid activities, things, and environments that may irritate or injure your eye.  Keep all follow-up visits as told by your health care provider. This is important. Contact a health care provider if:  You have pain in your eye.  The bleeding does not go away within 3 weeks.  You keep getting new subconjunctival hemorrhages. Get help right away if:  Your vision changes or you have difficulty seeing.  You suddenly develop severe sensitivity to light.  You develop a severe headache, persistent vomiting, confusion, or abnormal tiredness (lethargy).  Your eye seems to bulge or protrude from your eye socket.  You develop unexplained bruises on your body.  You have unexplained bleeding in another area of your body. This information is not intended to replace advice given to you by your health care provider. Make sure you discuss any questions  you have with your health care provider. Document Released: 04/25/2005 Document Revised: 12/20/2015 Document Reviewed: 07/02/2014 Elsevier Interactive Patient Education  [employer].

## 2018-04-12 NOTE — Assessment & Plan Note (Signed)
-  Symptoms consistent with viral etiology -UA without signs of infection at this time.  -Recommend supportive care, push fluids -Discussed that [place] hemorrhage should resolve on its own.  -Call or RTC if not improving or for worsening symptoms.

## 2018-04-13 ENCOUNTER — Other Ambulatory Visit: Payer: Self-pay | Admitting: Family Medicine

## 2018-04-24 ENCOUNTER — Other Ambulatory Visit: Payer: Self-pay | Admitting: Family Medicine

## 2018-04-24 DIAGNOSIS — F419 Anxiety disorder, unspecified: Principal | ICD-10-CM

## 2018-04-24 DIAGNOSIS — F32A Depression, unspecified: Secondary | ICD-10-CM

## 2018-04-24 DIAGNOSIS — F329 Major depressive disorder, single episode, unspecified: Secondary | ICD-10-CM

## 2018-04-27 NOTE — Telephone Encounter (Signed)
Last Alprazolam RX: 03/26/18, #60 Last OV: 09/22/17 w/PCP Next OV: none scheduled UDS: 06/02/17, low risk CSC: 06/02/17 CSR: No discrepancies identified

## 2018-05-29 ENCOUNTER — Other Ambulatory Visit: Payer: Self-pay | Admitting: Family Medicine

## 2018-05-29 DIAGNOSIS — Z1231 Encounter for screening mammogram for malignant neoplasm of breast: Secondary | ICD-10-CM

## 2018-07-01 ENCOUNTER — Other Ambulatory Visit: Payer: Self-pay | Admitting: Family Medicine

## 2018-07-01 DIAGNOSIS — F419 Anxiety disorder, unspecified: Principal | ICD-10-CM

## 2018-07-01 DIAGNOSIS — F329 Major depressive disorder, single episode, unspecified: Secondary | ICD-10-CM

## 2018-07-01 DIAGNOSIS — F32A Depression, unspecified: Secondary | ICD-10-CM

## 2018-07-05 ENCOUNTER — Encounter: Payer: Self-pay | Admitting: Family Medicine

## 2018-07-06 NOTE — Telephone Encounter (Signed)
Last written: 04/27/18 Last ov: 09/22/17 Next ov: 09/25/18 Contract: will get at 5/19 appt UDS: will get at 5/19 appt

## 2018-07-17 ENCOUNTER — Ambulatory Visit
Admission: RE | Admit: 2018-07-17 | Discharge: 2018-07-17 | Disposition: A | Discharge status: Home / Self Care | Payer: BC Managed Care – PPO | Source: Ambulatory Visit | Attending: Family Medicine | Admitting: Family Medicine

## 2018-07-17 DIAGNOSIS — Z1231 Encounter for screening mammogram for malignant neoplasm of breast: Secondary | ICD-10-CM

## 2018-07-18 ENCOUNTER — Other Ambulatory Visit: Payer: Self-pay | Admitting: Family Medicine

## 2018-07-25 ENCOUNTER — Telehealth: Payer: BC Managed Care – PPO | Admitting: Family

## 2018-07-25 DIAGNOSIS — J069 Acute upper respiratory infection, unspecified: Secondary | ICD-10-CM

## 2018-07-25 MED ORDER — BENZONATATE 100 MG PO CAPS: 100.0000 mg | ORAL_CAPSULE | Freq: Three times a day (TID) | ORAL | 0 refills | Status: DC | PRN

## 2018-07-25 MED ORDER — FLUTICASONE PROPIONATE 50 MCG/ACT NA SUSP: 2.0000 | Freq: Every day | NASAL | 0 refills | Status: DC

## 2018-07-25 NOTE — Progress Notes (Signed)

## 2018-08-31 ENCOUNTER — Other Ambulatory Visit: Payer: Self-pay | Admitting: Family Medicine

## 2018-08-31 DIAGNOSIS — F419 Anxiety disorder, unspecified: Principal | ICD-10-CM

## 2018-08-31 DIAGNOSIS — F329 Major depressive disorder, single episode, unspecified: Secondary | ICD-10-CM

## 2018-08-31 NOTE — Telephone Encounter (Signed)
Requesting:Alprazolam Contract:1/25  needs updated UDS:06/02/2017 Last Visit:09/22/2017 Next Visit:09/25/2018 Last Refill:07/06/2018 1 refill  Please Advise

## 2018-09-11 ENCOUNTER — Encounter: Payer: Self-pay | Admitting: Family Medicine

## 2018-09-21 NOTE — Progress Notes (Signed)
Greater than 5 minutes, yet less than 10 minutes of time have been spent researching, coordinating, and implementing care for this patient today.  Thank you for the details you included in the comment boxes. Those details are very helpful in determining the best course of treatment for you and help us to provide the best care.  

## 2018-09-25 ENCOUNTER — Encounter: Payer: BC Managed Care – PPO | Admitting: Family Medicine

## 2018-09-30 ENCOUNTER — Other Ambulatory Visit: Payer: Self-pay | Admitting: Family Medicine

## 2018-09-30 DIAGNOSIS — F419 Anxiety disorder, unspecified: Secondary | ICD-10-CM

## 2018-09-30 DIAGNOSIS — F329 Major depressive disorder, single episode, unspecified: Secondary | ICD-10-CM

## 2018-10-02 NOTE — Telephone Encounter (Signed)
Requesting: Xanax Contract: 06/02/2017 UDS: 06/02/2017, low risk, next screen 06/02/2018 Last OV: 09/22/2017 Next OV: N/A Last Refill: 08/31/2018, #60--0 RF Database:   Please advise

## 2018-10-12 ENCOUNTER — Encounter: Payer: Self-pay | Admitting: Family Medicine

## 2018-10-14 ENCOUNTER — Other Ambulatory Visit: Payer: Self-pay | Admitting: Family Medicine

## 2018-10-26 ENCOUNTER — Telehealth: Payer: Self-pay | Admitting: Gastroenterology

## 2018-10-26 ENCOUNTER — Encounter: Payer: Self-pay | Admitting: Gastroenterology

## 2018-10-26 ENCOUNTER — Telehealth: Payer: Self-pay | Admitting: *Deleted

## 2018-10-26 NOTE — Telephone Encounter (Signed)
Patient called in stating she recevied recall letter which stated"CHANGED FROM JULY 2020 DUE TO CHANGES IN CURRENT GUIDELINES- MD REVIEWED CHART & LETTER SENT TO PTl-former Dr Deatra Ina pt" and she wants to speak with someone in regards to this because she do not approved on that date. Please call thanks

## 2018-10-26 NOTE — Telephone Encounter (Signed)
Entered in error

## 2018-10-26 NOTE — Telephone Encounter (Signed)
Spoke to the patient who is HIGHLY against waiting another 2 years for her colonoscopy. The patient stated that due to her family history of colon cancer and because polyps have been discovered with every colonoscopy performed, she absolutely is not willing the wait another 2 years for a colonoscopy. "Who will be responsible if I develop colon cancer?! I had a very close friend of mine die of colon cancer not too long ago." This is what the patient expressed during the phone call. Can this patient be scheduled for a colonoscopy? Please advise.

## 2018-10-29 ENCOUNTER — Encounter: Payer: Self-pay | Admitting: *Deleted

## 2018-10-29 NOTE — Telephone Encounter (Signed)
Called the patient and left a message to call back if needed. Also sent a MyChart message.  Colon in [place] scheduled Wed 7/29 at 10:00 am. Previsit with nurse appt Wed 7/15 at 2:00 pm. TELEPHONE.   Appt reminder sent to patient via letter as well.

## 2018-10-29 NOTE — Telephone Encounter (Signed)
Based on recent task force and GI society guidelines, surveillance interval has changed for small adenomatous polyps from 5 to 7 years,.  But given her concerns, we can proceed with Colonoscopy in July 2020

## 2018-11-01 ENCOUNTER — Other Ambulatory Visit: Payer: Self-pay | Admitting: Family Medicine

## 2018-11-01 DIAGNOSIS — F32A Depression, unspecified: Secondary | ICD-10-CM

## 2018-11-01 DIAGNOSIS — F329 Major depressive disorder, single episode, unspecified: Secondary | ICD-10-CM

## 2018-11-06 NOTE — Telephone Encounter (Signed)
Requesting: Xanax Contract: 06/02/2017 UDS: 06/02/2017, low risk, 06/02/2018 Last OV: 09/22/2017 Next OV: 11/14/2018 Last Refill: 10/02/2018, #60--0 RF Database:   Please advise

## 2018-11-12 ENCOUNTER — Encounter: Payer: Self-pay | Admitting: Family Medicine

## 2018-11-12 ENCOUNTER — Telehealth: Payer: Self-pay | Admitting: Internal Medicine

## 2018-11-12 NOTE — Telephone Encounter (Signed)
Please  schedule a virtual visit for the patient, see her message.  Needs to be seen within 48 hours by me or order providers in the office

## 2018-11-13 ENCOUNTER — Other Ambulatory Visit: Payer: Self-pay

## 2018-11-13 ENCOUNTER — Encounter: Payer: Self-pay | Admitting: Internal Medicine

## 2018-11-13 ENCOUNTER — Ambulatory Visit (INDEPENDENT_AMBULATORY_CARE_PROVIDER_SITE_OTHER): Payer: BC Managed Care – PPO | Admitting: Internal Medicine

## 2018-11-13 DIAGNOSIS — F329 Major depressive disorder, single episode, unspecified: Secondary | ICD-10-CM

## 2018-11-13 MED ORDER — FLUOXETINE HCL 20 MG PO TABS: ORAL_TABLET | ORAL | 1 refills | Status: DC

## 2018-11-13 NOTE — Progress Notes (Signed)
Subjective:    Patient ID: Norma Kelley, female    DOB: 09/09/1960, 57 y.o.   MRN: [id number]  DOS:  11/13/2018 Type of visit - description:  Virtual Visit via Video Note  I connected with@ on 11/13/18 at 11:00 AM EDT by a video enabled telemedicine application and verified that I am speaking with the correct person using two identifiers.   THIS ENCOUNTER IS A VIRTUAL VISIT DUE TO COVID-19 - PATIENT WAS NOT SEEN IN THE OFFICE. PATIENT HAS CONSENTED TO VIRTUAL VISIT / TELEMEDICINE VISIT   Location of patient: home  Location of provider: office  I discussed the limitations of evaluation and management by telemedicine and the availability of in person appointments. The patient expressed understanding and agreed to proceed.  History of Present Illness: Acute: The patient is feeling depressed. She lost her mother in February, now the pandemia is creating more stress. Fortunately she is working from home.  She had a good experience with Prozac few years ago when she went through some difficult times.  Review of Systems Denies suicidal ideas Sleep is poor but she is taking Xanax which was prescribed for anxiety by PCP. Not exercising much lately   Past Medical History:  Diagnosis Date  . Anxiety   . Breast cancer ([place]) 2007   left breast cancer  . Diet-controlled diabetes mellitus ([place])   . GERD (gastroesophageal reflux disease)   . Hypothyroidism   . Thyroid disease     Past Surgical History:  Procedure Laterality Date  . ABDOMINAL HYSTERECTOMY    . BACK SURGERY  2013   lumbar  . 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 BREAST LUMPECTOMY WITH RADIOACTIVE SEED LOCALIZATION Right 08/17/2015   Procedure: BREAST LUMPECTOMY WITH RADIOACTIVE SEED LOCALIZATION;  Surgeon: Excell Seltzer, MD;  Location: [facility];  Service: General;  Laterality: Right;  . BREAST SURGERY     Mastectomy and reconstruction  . MASTECTOMY Left 2007   mastectomy and  tamoxifen  . TONSILLECTOMY      Social History   Socioeconomic History  . Marital status: Married    Spouse name: Not on file  . Number of children: 3  . Years of education: Not on file  . Highest education level: Not on file  Occupational History    Employer: [employer]  Social Needs  . Financial resource strain: Not on file  . Food insecurity    Worry: Not on file    Inability: Not on file  . Transportation needs    Medical: Not on file    Non-medical: Not on file  Tobacco Use  . Smoking status: Former Smoker    Packs/day: 1.00    Years: 20.00    Pack years: 20.00    Types: Cigarettes  . Smokeless tobacco: Never Used  . Tobacco comment: Quit 14 years ago  Substance and Sexual Activity  . Alcohol use: No  . Drug use: No  . Sexual activity: Yes  Lifestyle  . Physical activity    Days per week: Not on file    Minutes per session: Not on file  . Stress: Not on file  Relationships  . Social Herbalist on phone: Not on file    Gets together: Not on file    Attends religious service: Not on file    Active member of club or organization: Not on file    Attends meetings of clubs or organizations: Not on file  Relationship status: Not on file  . Intimate partner violence    Fear of current or ex partner: Not on file    Emotionally abused: Not on file    Physically abused: Not on file    Forced sexual activity: Not on file  Other Topics Concern  . Not on file  Social History Narrative   Lives with husband.  Occasional exercise.      Allergies as of 11/13/2018      Reactions   Sulfamethoxazole Itching   Swelling around eyes   Sulfonamide Derivatives       Medication List       Accurate as of November 13, 2018 11:05 AM. If you have any questions, ask your nurse or doctor.        ALPRAZolam 0.25 MG tablet Commonly known as: XANAX TAKE 1 TABLET BY MOUTH THREE TIMES DAILY AS NEEDED   aspirin 81 MG tablet Take 81 mg by mouth daily.    benzonatate 100 MG capsule Commonly known as: TESSALON Take 1 capsule (100 mg total) by mouth 3 (three) times daily as needed for cough.   [facility] Omeprazole 20 MG Tbec Generic drug: Omeprazole Take 1 tablet by mouth daily.   fluticasone 50 MCG/ACT nasal spray Commonly known as: FLONASE Place 2 sprays into both nostrils daily.   levothyroxine 100 MCG tablet Commonly known as: SYNTHROID TAKE 1 TABLET(100 MCG) BY MOUTH DAILY   valACYclovir 500 MG tablet Commonly known as: VALTREX TAKE 1 TABLET BY MOUTH EVERY DAY AS NEEDED           Objective:   Physical Exam There were no vitals taken for this visit. This is a virtual video visit, the patient was tearful but alert oriented x3, coherent, cooperative, attention span was normal.    Assessment    58 year old female, PMH includes thyroid disease, depression, presents for evaluation of:  Depression: As described above, triggered by the death of her mother and now the pandemia is putting additional stress on her.  The patient is counseled to the best of my ability. She already made the decision and will seek counseling. Other tools that she can use is eat healthy and increase her physical activity. She is already taking Xanax to help her sleep We talked about medications, pros and cons, at the end we agreed to start Prozac which she tolerated well before.  Watch for side effects. If  she feels she is improving then she will follow-up in September with PCP as already scheduled.  If she is not improving, or she has side effects she will call for a visit in 4 to 5 weeks.      I discussed the assessment and treatment plan with the patient. The patient was provided an opportunity to ask questions and all were answered. The patient agreed with the plan and demonstrated an understanding of the instructions.   The patient was advised to call back or seek an in-person evaluation if the symptoms worsen or if the condition fails to improve as  anticipated.

## 2018-11-14 ENCOUNTER — Encounter: Payer: BC Managed Care – PPO | Admitting: Family Medicine

## 2018-11-21 ENCOUNTER — Other Ambulatory Visit: Payer: Self-pay

## 2018-11-21 ENCOUNTER — Ambulatory Visit: Payer: BC Managed Care – PPO | Admitting: *Deleted

## 2018-11-21 ENCOUNTER — Other Ambulatory Visit: Payer: Self-pay | Admitting: Family Medicine

## 2018-11-21 VITALS — Ht 66.0 in | Wt 194.0 lb

## 2018-11-21 DIAGNOSIS — Z86010 Personal history of colonic polyps: Secondary | ICD-10-CM

## 2018-11-21 MED ORDER — NA SULFATE-K SULFATE-MG SULF 17.5-3.13-1.6 GM/177ML PO SOLN: 1.0000 | Freq: Once | ORAL | 0 refills | Status: AC

## 2018-11-21 MED ORDER — LEVOTHYROXINE SODIUM 100 MCG PO TABS: ORAL_TABLET | ORAL | 0 refills | Status: DC

## 2018-11-21 NOTE — Progress Notes (Signed)

## 2018-12-04 ENCOUNTER — Telehealth: Payer: Self-pay | Admitting: Gastroenterology

## 2018-12-04 NOTE — Telephone Encounter (Signed)

## 2018-12-05 ENCOUNTER — Ambulatory Visit (AMBULATORY_SURGERY_CENTER): Payer: BC Managed Care – PPO | Admitting: Gastroenterology

## 2018-12-05 ENCOUNTER — Other Ambulatory Visit: Payer: Self-pay

## 2018-12-05 ENCOUNTER — Encounter: Payer: Self-pay | Admitting: Gastroenterology

## 2018-12-05 VITALS — BP 128/80 | HR 61 | Temp 98.4°F | Resp 13 | Ht 66.0 in | Wt 194.0 lb

## 2018-12-05 DIAGNOSIS — D122 Benign neoplasm of ascending colon: Secondary | ICD-10-CM

## 2018-12-05 DIAGNOSIS — Z86010 Personal history of colonic polyps: Secondary | ICD-10-CM

## 2018-12-05 DIAGNOSIS — D128 Benign neoplasm of rectum: Secondary | ICD-10-CM | POA: Diagnosis not present

## 2018-12-05 DIAGNOSIS — D120 Benign neoplasm of cecum: Secondary | ICD-10-CM

## 2018-12-05 DIAGNOSIS — D125 Benign neoplasm of sigmoid colon: Secondary | ICD-10-CM | POA: Diagnosis not present

## 2018-12-05 MED ORDER — SODIUM CHLORIDE 0.9 % IV SOLN: 500.0000 mL | Freq: Once | INTRAVENOUS | Status: DC

## 2018-12-05 NOTE — Progress Notes (Signed)
PT taken to PACU. Monitors in place. VSS. Report given to RN. 

## 2018-12-05 NOTE — Op Note (Signed)
[facility] Patient Name: Norma Kelley Procedure Date: 12/05/2018 10:31 AM MRN: [id number] Endoscopist: Mauri Pole , MD Age: 58 Referring MD:  Date of Birth: [birth year] Gender: Female Account #: [id number] Procedure:                Colonoscopy Indications:              High risk colon cancer surveillance: Personal                            history of colonic polyps Medicines:                Monitored Anesthesia Care Procedure:                Pre-Anesthesia Assessment:                           - Prior to the procedure, a History and Physical                            was performed, and patient medications and                            allergies were reviewed. The patient's tolerance of                            previous anesthesia was also reviewed. The risks                            and benefits of the procedure and the sedation                            options and risks were discussed with the patient.                            All questions were answered, and informed consent                            was obtained. Prior Anticoagulants: The patient has                            taken no previous anticoagulant or antiplatelet                            agents. ASA Grade Assessment: II - A patient with                            mild systemic disease. After reviewing the risks                            and benefits, the patient was deemed in                            satisfactory condition to undergo the procedure.  After obtaining informed consent, the colonoscope                            was passed under direct vision. Throughout the                            procedure, the patient's blood pressure, pulse, and                            oxygen saturations were monitored continuously. The                            Colonoscope was introduced through the anus and                            advanced to the the cecum,  identified by                            appendiceal orifice and ileocecal valve. The                            colonoscopy was performed without difficulty. The                            patient tolerated the procedure well. The quality                            of the bowel preparation was good. The ileocecal                            valve, appendiceal orifice, and rectum were                            photographed. Scope In: 10:43:56 AM Scope Out: 25:42:70 AM Scope Withdrawal Time: 0 hours 13 minutes 16 seconds  Total Procedure Duration: 0 hours 25 minutes 13 seconds  Findings:                 The perianal and digital rectal examinations were                            normal.                           Two sessile polyps were found in the sigmoid colon                            and ascending colon. The polyps were 3 to 4 mm in                            size. These polyps were removed with a cold snare.                            Resection and retrieval were complete.  Two sessile polyps were found in the rectum and                            cecum. The polyps were 1 to 2 mm in size. These                            polyps were removed with a cold biopsy forceps.                            Resection and retrieval were complete.                           Scattered small and large-mouthed diverticula were                            found in the sigmoid colon, descending colon and                            ascending colon.                           Non-bleeding internal hemorrhoids were found during                            retroflexion. The hemorrhoids were medium-sized. Complications:            No immediate complications. Estimated Blood Loss:     Estimated blood loss was minimal. Impression:               - Two 3 to 4 mm polyps in the sigmoid colon and in                            the ascending colon, removed with a cold snare.                             Resected and retrieved.                           - Two 1 to 2 mm polyps in the rectum and in the                            cecum, removed with a cold biopsy forceps. Resected                            and retrieved.                           - Mild diverticulosis in the sigmoid colon, in the                            descending colon and in the ascending colon.                           - Non-bleeding internal hemorrhoids. Recommendation:           -  Patient has a contact number available for                            emergencies. The signs and symptoms of potential                            delayed complications were discussed with the                            patient. Return to normal activities tomorrow.                            Written discharge instructions were provided to the                            patient.                           - Resume previous diet.                           - Continue present medications.                           - Await pathology results.                           - Repeat colonoscopy in 3 - 5 years for                            surveillance based on pathology results. Mauri Pole, MD 12/05/2018 11:16:23 AM This report has been signed electronically.

## 2018-12-05 NOTE — Progress Notes (Signed)
Called to room to assist during endoscopic procedure.  Patient ID and intended procedure confirmed with present staff. Received instructions for my participation in the procedure from the performing physician.  

## 2018-12-05 NOTE — Patient Instructions (Signed)
Handouts Provided:  Polyps  YOU HAD AN ENDOSCOPIC PROCEDURE TODAY AT [facility]:   Refer to the procedure report that was given to you for any specific questions about what was found during the examination.  If the procedure report does not answer your questions, please call your gastroenterologist to clarify.  If you requested that your care partner not be given the details of your procedure findings, then the procedure report has been included in a sealed envelope for you to review at your convenience later.  YOU SHOULD EXPECT: Some feelings of bloating in the abdomen. Passage of more gas than usual.  Walking can help get rid of the air that was put into your GI tract during the procedure and reduce the bloating. If you had a lower endoscopy (such as a colonoscopy or flexible sigmoidoscopy) you may notice spotting of blood in your stool or on the toilet paper. If you underwent a bowel prep for your procedure, you may not have a normal bowel movement for a few days.  Please Note:  You might notice some irritation and congestion in your nose or some drainage.  This is from the oxygen used during your procedure.  There is no need for concern and it should clear up in a day or so.  SYMPTOMS TO REPORT IMMEDIATELY:   Following lower endoscopy (colonoscopy or flexible sigmoidoscopy):  Excessive amounts of blood in the stool  Significant tenderness or worsening of abdominal pains  Swelling of the abdomen that is new, acute  Fever of 100F or higher  For urgent or emergent issues, a gastroenterologist can be reached at any hour by calling [phone].   DIET:  We do recommend a small meal at first, but then you may proceed to your regular diet.  Drink plenty of fluids but you should avoid alcoholic beverages for 24 hours.  ACTIVITY:  You should plan to take it easy for the rest of today and you should NOT DRIVE or use heavy machinery until tomorrow (because of the sedation  medicines used during the test).    FOLLOW UP: Our staff will call the number listed on your records 48-72 hours following your procedure to check on you and address any questions or concerns that you may have regarding the information given to you following your procedure. If we do not reach you, we will leave a message.  We will attempt to reach you two times.  During this call, we will ask if you have developed any symptoms of COVID 19. If you develop any symptoms (ie: fever, flu-like symptoms, shortness of breath, cough etc.) before then, please call [phone].  If you test positive for Covid 19 in the 2 weeks post procedure, please call and report this information to [place].    If any biopsies were taken you will be contacted by phone or by letter within the next 1-3 weeks.  Please call us at [phone] if you have not heard about the biopsies in 3 weeks.    SIGNATURES/CONFIDENTIALITY: You and/or your care partner have signed paperwork which will be entered into your electronic medical record.  These signatures attest to the fact that that the information above on your After Visit Summary has been reviewed and is understood.  Full responsibility of the confidentiality of this discharge information lies with you and/or your care-partner.

## 2018-12-05 NOTE — Progress Notes (Signed)
Pt's states no medical or surgical changes since previsit or office visit.  Temp-June murray  Vital signs-courtney [employer]

## 2018-12-07 ENCOUNTER — Telehealth: Payer: Self-pay

## 2018-12-07 NOTE — Telephone Encounter (Signed)
Attempted to reach pt. With follow-up call following endoscopic procedure 12/05/2018.  LM on pt. Voice mail.  Will try to reach pt. Again later today.

## 2018-12-07 NOTE — Telephone Encounter (Signed)
  Follow up Call-  Call back number 12/05/2018  Post procedure Call Back phone  # [phone]  Permission to leave phone message Yes  Some recent data might be hidden     Patient questions:  Do you have a fever, pain , or abdominal swelling? No. Pain Score  0 *  Have you tolerated food without any problems? Yes.    Have you been able to return to your normal activities? Yes.    Do you have any questions about your discharge instructions: Diet   No. Medications  No. Follow up visit  No.  Do you have questions or concerns about your Care? No.  Actions: * If pain score is 4 or above: No action needed, pain <4.  1. Have you developed a fever since your procedure? no  2.   Have you had an respiratory symptoms (SOB or cough) since your procedure? no  3.   Have you tested positive for COVID 19 since your procedure no  4.   Have you had any family members/close contacts diagnosed with the COVID 19 since your procedure?  no   If yes to any of these questions please route to Joylene John, RN and Alphonsa Gin, Therapist, sports.

## 2018-12-11 ENCOUNTER — Other Ambulatory Visit: Payer: Self-pay | Admitting: Family Medicine

## 2018-12-11 DIAGNOSIS — F32A Depression, unspecified: Secondary | ICD-10-CM

## 2018-12-11 DIAGNOSIS — F329 Major depressive disorder, single episode, unspecified: Secondary | ICD-10-CM

## 2018-12-11 NOTE — Telephone Encounter (Signed)
Requesting: Xanax Contract: 06/02/2017 UDS: 06/02/2017, low risk, next screening 06/02/2018 Last OV: 11/13/2018 w/ Paz Next OV: 01/21/2019 Last Refill: 11/06/2018, #60--0 RF Database:   Please advise

## 2018-12-18 ENCOUNTER — Encounter: Payer: Self-pay | Admitting: Gastroenterology

## 2018-12-19 ENCOUNTER — Other Ambulatory Visit: Payer: Self-pay | Admitting: *Deleted

## 2018-12-19 MED ORDER — LEVOTHYROXINE SODIUM 100 MCG PO TABS: ORAL_TABLET | ORAL | 1 refills | Status: DC

## 2019-01-21 ENCOUNTER — Encounter: Payer: Self-pay | Admitting: Family Medicine

## 2019-01-21 ENCOUNTER — Other Ambulatory Visit: Payer: Self-pay

## 2019-01-21 ENCOUNTER — Ambulatory Visit (INDEPENDENT_AMBULATORY_CARE_PROVIDER_SITE_OTHER): Payer: BC Managed Care – PPO | Admitting: Family Medicine

## 2019-01-21 VITALS — BP 126/82 | HR 76 | Temp 97.4°F | Resp 18 | Ht 66.0 in | Wt 195.8 lb

## 2019-01-21 DIAGNOSIS — Z0000 Encounter for general adult medical examination without abnormal findings: Secondary | ICD-10-CM | POA: Diagnosis not present

## 2019-01-21 DIAGNOSIS — Z23 Encounter for immunization: Secondary | ICD-10-CM

## 2019-01-21 DIAGNOSIS — F419 Anxiety disorder, unspecified: Secondary | ICD-10-CM

## 2019-01-21 DIAGNOSIS — E039 Hypothyroidism, unspecified: Secondary | ICD-10-CM | POA: Diagnosis not present

## 2019-01-21 DIAGNOSIS — F329 Major depressive disorder, single episode, unspecified: Secondary | ICD-10-CM

## 2019-01-21 DIAGNOSIS — Z79899 Other long term (current) drug therapy: Secondary | ICD-10-CM

## 2019-01-21 LAB — CBC WITH DIFFERENTIAL/PLATELET
Basophils Absolute: 0.1 10*3/uL (ref 0.0–0.1)
Basophils Relative: 0.7 % (ref 0.0–3.0)
Eosinophils Absolute: 0.1 10*3/uL (ref 0.0–0.7)
Eosinophils Relative: 1.5 % (ref 0.0–5.0)
HCT: 39.6 % (ref 36.0–46.0)
Hemoglobin: 13.1 g/dL (ref 12.0–15.0)
Lymphocytes Relative: 23.1 % (ref 12.0–46.0)
Lymphs Abs: 1.9 10*3/uL (ref 0.7–4.0)
MCHC: 33.2 g/dL (ref 30.0–36.0)
MCV: 89.2 fl (ref 78.0–100.0)
Monocytes Absolute: 0.5 10*3/uL (ref 0.1–1.0)
Monocytes Relative: 5.6 % (ref 3.0–12.0)
Neutro Abs: 5.5 10*3/uL (ref 1.4–7.7)
Neutrophils Relative %: 69.1 % (ref 43.0–77.0)
Platelets: 296 10*3/uL (ref 150.0–400.0)
RBC: 4.44 Mil/uL (ref 3.87–5.11)
RDW: 13.4 % (ref 11.5–15.5)
WBC: 8 10*3/uL (ref 4.0–10.5)

## 2019-01-21 LAB — LIPID PANEL
Cholesterol: 168 mg/dL (ref 0–200)
HDL: 59.8 mg/dL (ref >39.00)
LDL Cholesterol: 84 mg/dL (ref 0–99)
NonHDL: 108.29
Total CHOL/HDL Ratio: 3
Triglycerides: 122 mg/dL (ref 0.0–149.0)
VLDL: 24.4 mg/dL (ref 0.0–40.0)

## 2019-01-21 LAB — COMPREHENSIVE METABOLIC PANEL
ALT: 15 U/L (ref 0–35)
AST: 17 U/L (ref 0–37)
Albumin: 4.8 g/dL (ref 3.5–5.2)
Alkaline Phosphatase: 91 U/L (ref 39–117)
BUN: 13 mg/dL (ref 6–23)
CO2: 28 mEq/L (ref 19–32)
Calcium: 9.8 mg/dL (ref 8.4–10.5)
Chloride: 102 mEq/L (ref 96–112)
Creatinine, Ser: 0.75 mg/dL (ref 0.40–1.20)
GFR: 78.71 mL/min (ref >60.00)
Glucose, Bld: 87 mg/dL (ref 70–99)
Potassium: 3.9 mEq/L (ref 3.5–5.1)
Sodium: 140 mEq/L (ref 135–145)
Total Bilirubin: 0.8 mg/dL (ref 0.2–1.2)
Total Protein: 7.2 g/dL (ref 6.0–8.3)

## 2019-01-21 LAB — TSH: TSH: 1.94 u[IU]/mL (ref 0.35–4.50)

## 2019-01-21 MED ORDER — ALPRAZOLAM 0.25 MG PO TABS: 0.2500 mg | ORAL_TABLET | Freq: Three times a day (TID) | ORAL | 1 refills | Status: DC | PRN

## 2019-01-21 MED ORDER — LEVOTHYROXINE SODIUM 100 MCG PO TABS: ORAL_TABLET | ORAL | 1 refills | Status: DC

## 2019-01-21 NOTE — Progress Notes (Signed)
Subjective:     Norma Kelley is a 58 y.o. female and is here for a comprehensive physical exam. The patient reports no problems.  Social History   Socioeconomic History  . Marital status: Married    Spouse name: Not on file  . Number of children: 3  . Years of education: Not on file  . Highest education level: Not on file  Occupational History    Employer: [employer]  Social Needs  . Financial resource strain: Not on file  . Food insecurity    Worry: Not on file    Inability: Not on file  . Transportation needs    Medical: Not on file    Non-medical: Not on file  Tobacco Use  . Smoking status: Former Smoker    Packs/day: 1.00    Years: 20.00    Pack years: 20.00    Types: Cigarettes  . Smokeless tobacco: Never Used  . Tobacco comment: Quit 14 years ago  Substance and Sexual Activity  . Alcohol use: No  . Drug use: No  . Sexual activity: Yes  Lifestyle  . Physical activity    Days per week: Not on file    Minutes per session: Not on file  . Stress: Not on file  Relationships  . Social Herbalist on phone: Not on file    Gets together: Not on file    Attends religious service: Not on file    Active member of club or organization: Not on file    Attends meetings of clubs or organizations: Not on file    Relationship status: Not on file  . Intimate partner violence    Fear of current or ex partner: Not on file    Emotionally abused: Not on file    Physically abused: Not on file    Forced sexual activity: Not on file  Other Topics Concern  . Not on file  Social History Narrative   Lives with husband.  Occasional exercise.   Health Maintenance  Topic Date Due  . MAMMOGRAM  07/17/2019  . TETANUS/TDAP  12/21/2022  . COLONOSCOPY  12/04/2025  . INFLUENZA VACCINE  Completed  . Hepatitis C Screening  Completed  . HIV Screening  Completed    The following portions of the patient's history were reviewed and updated as appropriate:  She  has a past  medical history of Allergy, Anxiety, Arthritis, Breast cancer ([place]) (2007), Depression, Diet-controlled diabetes mellitus ([place]), GERD (gastroesophageal reflux disease), Hypothyroidism, and Thyroid disease. She does not have any pertinent problems on file. She  has a past surgical history that includes Abdominal hysterectomy; Tonsillectomy; Breast surgery; Mastectomy (Left, 2007); Back surgery (2013); Breast lumpectomy with radioactive seed localization (Right, 08/17/2015); Breast excisional biopsy (Right, 08/17/2015); Colonoscopy; Polypectomy; and Back surgery. Her family history includes Breast cancer (age of onset: 44) in her mother; Colon cancer in her maternal grandfather; Colon polyps in her father; Diabetes in her mother; Head & neck cancer in her maternal grandmother; Heart attack (age of onset: 47) in her father; Heart attack (age of onset: 78) in her mother; Hyperlipidemia in her mother; Osteoporosis in her mother. She  reports that she has quit smoking. Her smoking use included cigarettes. She has a 20.00 pack-year smoking history. She has never used smokeless tobacco. She reports that she does not drink alcohol or use drugs. She has a current medication list which includes the following prescription(s): alprazolam, aspirin, levothyroxine, omeprazole, and valacyclovir. Current Outpatient Medications on  File Prior to Visit  Medication Sig Dispense Refill  . aspirin 81 MG tablet Take 81 mg by mouth daily.    . Omeprazole ([facility] OMEPRAZOLE) 20 MG TBEC Take 1 tablet by mouth daily.     . valACYclovir (VALTREX) 500 MG tablet TAKE 1 TABLET BY MOUTH EVERY DAY AS NEEDED 30 tablet 1   No current facility-administered medications on file prior to visit.    She is allergic to sulfamethoxazole and sulfonamide derivatives.. . Review of Systems  Review of Systems  Constitutional: Negative for activity change, appetite change and fatigue.  HENT: Negative for hearing loss, congestion, tinnitus and ear  discharge.  dentist q4m Eyes: Negative for visual disturbance (see optho q1y -- vision corrected to 20/20 with glasses).  Respiratory: Negative for cough, chest tightness and shortness of breath.   Cardiovascular: Negative for chest pain, palpitations and leg swelling.  Gastrointestinal: Negative for abdominal pain, diarrhea, constipation and abdominal distention.  Genitourinary: Negative for urgency, frequency, decreased urine volume and difficulty urinating.  Musculoskeletal: Negative for back pain, arthralgias and gait problem.  Skin: Negative for color change, pallor and rash.  Neurological: Negative for dizziness, light-headedness, numbness and headaches.  Hematological: Negative for adenopathy. Does not bruise/bleed easily.  Psychiatric/Behavioral: Negative for suicidal ideas, confusion, sleep disturbance, self-injury, dysphoric mood, decreased concentration and agitation.      Objective:    BP 126/82 (BP Location: Left Arm, Patient Position: Sitting, Cuff Size: Normal)   Pulse 76   Temp (!) 97.4 F (36.3 C) (Temporal)   Resp 18   Ht 5\' 6"  (1.676 m)   Wt 195 lb 12.8 oz (88.8 kg)   SpO2 99%   BMI 31.60 kg/m  General appearance: alert, cooperative, appears stated age and no distress Head: Normocephalic, without obvious abnormality, atraumatic Eyes: conjunctivae/corneas clear. PERRL, EOM's intact. Fundi benign. Ears: normal TM's and external ear canals both ears Nose: Nares normal. Septum midline. Mucosa normal. No drainage or sinus tenderness. Throat: lips, mucosa, and tongue normal; teeth and gums normal Neck: no adenopathy, no carotid bruit, no JVD, supple, symmetrical, trachea midline and thyroid not enlarged, symmetric, no tenderness/mass/nodules Back: symmetric, no curvature. ROM normal. No CVA tenderness. Lungs: clear to auscultation bilaterally Breasts: normal appearance, no masses or tenderness, scar from L mastectomy Heart: regular rate and rhythm, S1, S2 normal, no  murmur, click, rub or gallop Abdomen: soft, non-tender; bowel sounds normal; no masses,  no organomegaly Pelvic: not indicated; status post hysterectomy, negative ROS Extremities: extremities normal, atraumatic, no cyanosis or edema Pulses: 2+ and symmetric Skin: Skin color, texture, turgor normal. No rashes or lesions Lymph nodes: Cervical, supraclavicular, and axillary nodes normal. Neurologic: Alert and oriented X 3, normal strength and tone. Normal symmetric reflexes. Normal coordination and gait    Assessment:    Healthy female exam.      Plan:     ghm utd Check labs Flu shot today See After Visit Summary for Counseling Recommendations    1. Anxiety and depression Stable con't prn meds - ALPRAZolam (XANAX) 0.25 MG tablet; Take 1 tablet (0.25 mg total) by mouth 3 (three) times daily as needed.  Dispense: 60 tablet; Refill: 1  2. Need for influenza vaccination  - Flu Vaccine QUAD 6+ mos PF IM (Fluarix Quad PF)  3. Hypothyroidism, unspecified type Check labs today  - levothyroxine (SYNTHROID) 100 MCG tablet; TAKE 1 TABLET(100 MCG) BY MOUTH DAILY  Dispense: 90 tablet; Refill: 1 - TSH - Comprehensive metabolic panel  4. Preventative health care See  above - Lipid panel - CBC with Differential/Platelet - TSH - Comprehensive metabolic panel

## 2019-01-21 NOTE — Addendum Note (Signed)
Addended by: Sanda Linger on: 01/21/2019 02:05 PM   Modules accepted: Orders

## 2019-01-21 NOTE — Patient Instructions (Signed)
Preventive Care 40-60 Years Old, Female Preventive care refers to visits with your health care provider and lifestyle choices that can promote health and wellness. This includes:  A yearly physical exam. This may also be called an annual well check.  Regular dental visits and eye exams.  Immunizations.  Screening for certain conditions.  Healthy lifestyle choices, such as eating a healthy diet, getting regular exercise, not using drugs or products that contain nicotine and tobacco, and limiting alcohol use. What can I expect for my preventive care visit? Physical exam Your health care provider will check your:  Height and weight. This may be used to calculate body mass index (BMI), which tells if you are at a healthy weight.  Heart rate and blood pressure.  Skin for abnormal spots. Counseling Your health care provider may ask you questions about your:  Alcohol, tobacco, and drug use.  Emotional well-being.  Home and relationship well-being.  Sexual activity.  Eating habits.  Work and work environment.  Method of birth control.  Menstrual cycle.  Pregnancy history. What immunizations do I need?  Influenza (flu) vaccine  This is recommended every year. Tetanus, diphtheria, and pertussis (Tdap) vaccine  You may need a Td booster every 10 years. Varicella (chickenpox) vaccine  You may need this if you have not been vaccinated. Zoster (shingles) vaccine  You may need this after age 60. Measles, mumps, and rubella (MMR) vaccine  You may need at least one dose of MMR if you were born in 1957 or later. You may also need a second dose. Pneumococcal conjugate (PCV13) vaccine  You may need this if you have certain conditions and were not previously vaccinated. Pneumococcal polysaccharide (PPSV23) vaccine  You may need one or two doses if you smoke cigarettes or if you have certain conditions. Meningococcal conjugate (MenACWY) vaccine  You may need this if you  have certain conditions. Hepatitis A vaccine  You may need this if you have certain conditions or if you travel or work in places where you may be exposed to hepatitis A. Hepatitis B vaccine  You may need this if you have certain conditions or if you travel or work in places where you may be exposed to hepatitis B. Haemophilus influenzae type b (Hib) vaccine  You may need this if you have certain conditions. Human papillomavirus (HPV) vaccine  If recommended by your health care provider, you may need three doses over 6 months. You may receive vaccines as individual doses or as more than one vaccine together in one shot (combination vaccines). Talk with your health care provider about the risks and benefits of combination vaccines. What tests do I need? Blood tests  Lipid and cholesterol levels. These may be checked every 5 years, or more frequently if you are over 60 years old.  Hepatitis C test.  Hepatitis B test. Screening  Lung cancer screening. You may have this screening every year starting at age 60 if you have a 30-pack-year history of smoking and currently smoke or have quit within the past 15 years.  Colorectal cancer screening. All adults should have this screening starting at age 60 and continuing until age 75. Your health care provider may recommend screening at age 60 if you are at increased risk. You will have tests every 1-10 years, depending on your results and the type of screening test.  Diabetes screening. This is done by checking your blood sugar (glucose) after you have not eaten for a while (fasting). You may have this   done every 1-3 years.  Mammogram. This may be done every 1-2 years. Talk with your health care provider about when you should start having regular mammograms. This may depend on whether you have a family history of breast cancer.  BRCA-related cancer screening. This may be done if you have a family history of breast, ovarian, tubal, or peritoneal  cancers.  Pelvic exam and Pap test. This may be done every 3 years starting at age 60. Starting at age 60, this may be done every 5 years if you have a Pap test in combination with an HPV test. Other tests  Sexually transmitted disease (STD) testing.  Bone density scan. This is done to screen for osteoporosis. You may have this scan if you are at high risk for osteoporosis. Follow these instructions at home: Eating and drinking  Eat a diet that includes fresh fruits and vegetables, whole grains, lean protein, and low-fat dairy.  Take vitamin and mineral supplements as recommended by your health care provider.  Do not drink alcohol if: ? Your health care provider tells you not to drink. ? You are pregnant, may be pregnant, or are planning to become pregnant.  If you drink alcohol: ? Limit how much you have to 0-1 drink a day. ? Be aware of how much alcohol is in your drink. In the [place] one drink equals one 12 oz bottle of beer (355 mL), one 5 oz glass of wine (148 mL), or one 1 oz glass of hard liquor (44 mL). Lifestyle  Take daily care of your teeth and gums.  Stay active. Exercise for at least 30 minutes on 5 or more days each week.  Do not use any products that contain nicotine or tobacco, such as cigarettes, e-cigarettes, and chewing tobacco. If you need help quitting, ask your health care provider.  If you are sexually active, practice safe sex. Use a condom or other form of birth control (contraception) in order to prevent pregnancy and STIs (sexually transmitted infections).  If told by your health care provider, take low-dose aspirin daily starting at age 60. What's next?  Visit your health care provider once a year for a well check visit.  Ask your health care provider how often you should have your eyes and teeth checked.  Stay up to date on all vaccines. This information is not intended to replace advice given to you by your health care provider. Make sure you  discuss any questions you have with your health care provider. Document Released: 05/22/2015 Document Revised: 01/04/2018 Document Reviewed: 01/04/2018 Elsevier Patient Education  2020 [employer] Inc.  

## 2019-01-23 LAB — PAIN MGMT, PROFILE 8 W/CONF, U
6 Acetylmorphine: NEGATIVE ng/mL
Alcohol Metabolites: NEGATIVE ng/mL (ref <500)
Alphahydroxyalprazolam: 80 ng/mL
Alphahydroxymidazolam: NEGATIVE ng/mL
Alphahydroxytriazolam: NEGATIVE ng/mL
Aminoclonazepam: NEGATIVE ng/mL
Amphetamines: NEGATIVE ng/mL
Benzodiazepines: POSITIVE ng/mL
Buprenorphine, Urine: NEGATIVE ng/mL
Cocaine Metabolite: NEGATIVE ng/mL
Creatinine: 39.4 mg/dL
Hydroxyethylflurazepam: NEGATIVE ng/mL
Lorazepam: NEGATIVE ng/mL
MDMA: NEGATIVE ng/mL
Marijuana Metabolite: NEGATIVE ng/mL
Nordiazepam: NEGATIVE ng/mL
Opiates: NEGATIVE ng/mL
Oxazepam: NEGATIVE ng/mL
Oxidant: NEGATIVE ug/mL
Oxycodone: NEGATIVE ng/mL
Temazepam: NEGATIVE ng/mL
pH: 6.8 (ref 4.5–9.0)

## 2019-02-20 ENCOUNTER — Other Ambulatory Visit: Payer: Self-pay | Admitting: *Deleted

## 2019-02-20 DIAGNOSIS — E039 Hypothyroidism, unspecified: Secondary | ICD-10-CM

## 2019-02-20 MED ORDER — LEVOTHYROXINE SODIUM 100 MCG PO TABS: ORAL_TABLET | ORAL | 1 refills | Status: DC

## 2019-03-12 ENCOUNTER — Telehealth: Payer: Self-pay | Admitting: *Deleted

## 2019-03-12 MED ORDER — VALACYCLOVIR HCL 500 MG PO TABS: 500.0000 mg | ORAL_TABLET | Freq: Every day | ORAL | 1 refills | Status: DC | PRN

## 2019-03-12 NOTE — Telephone Encounter (Signed)
Refilled valtrex  

## 2019-04-16 ENCOUNTER — Encounter: Payer: Self-pay | Admitting: Family Medicine

## 2019-04-17 ENCOUNTER — Other Ambulatory Visit: Payer: Self-pay | Admitting: Family Medicine

## 2019-04-17 DIAGNOSIS — F32A Depression, unspecified: Secondary | ICD-10-CM

## 2019-04-17 DIAGNOSIS — F329 Major depressive disorder, single episode, unspecified: Secondary | ICD-10-CM

## 2019-04-17 MED ORDER — ALPRAZOLAM 0.25 MG PO TABS: 0.2500 mg | ORAL_TABLET | Freq: Three times a day (TID) | ORAL | 1 refills | Status: DC | PRN

## 2019-04-17 NOTE — Telephone Encounter (Signed)
Last Alprazolam RX: 01/21/19, #60 x 1 refill. Last OV: 01/21/19 Next OV: 07/22/19 UDS: 01/21/19 CSC: 01/21/19

## 2019-04-17 NOTE — Telephone Encounter (Signed)
done

## 2019-04-22 ENCOUNTER — Encounter: Payer: Self-pay | Admitting: Family Medicine

## 2019-04-22 ENCOUNTER — Other Ambulatory Visit: Payer: Self-pay

## 2019-04-22 ENCOUNTER — Ambulatory Visit: Payer: BC Managed Care – PPO | Admitting: Family Medicine

## 2019-04-22 VITALS — BP 140/90 | HR 78 | Temp 97.6°F | Resp 18 | Ht 66.0 in | Wt 200.2 lb

## 2019-04-22 DIAGNOSIS — R002 Palpitations: Secondary | ICD-10-CM | POA: Diagnosis not present

## 2019-04-22 DIAGNOSIS — I10 Essential (primary) hypertension: Secondary | ICD-10-CM | POA: Insufficient documentation

## 2019-04-22 DIAGNOSIS — R0789 Other chest pain: Secondary | ICD-10-CM | POA: Diagnosis not present

## 2019-04-22 LAB — POC URINALSYSI DIPSTICK (AUTOMATED)
Bilirubin, UA: NEGATIVE
Blood, UA: NEGATIVE
Glucose, UA: NEGATIVE
Ketones, UA: NEGATIVE
Leukocytes, UA: NEGATIVE
Nitrite, UA: NEGATIVE
Protein, UA: NEGATIVE
Spec Grav, UA: 1.015 (ref 1.010–1.025)
Urobilinogen, UA: 0.2 E.U./dL
pH, UA: 6.5 (ref 5.0–8.0)

## 2019-04-22 MED ORDER — LISINOPRIL 10 MG PO TABS: 10.0000 mg | ORAL_TABLET | Freq: Every day | ORAL | 3 refills | Status: DC

## 2019-04-22 NOTE — Progress Notes (Signed)
Patient ID: Norma Kelley, female    DOB: 10-16-1960  Age: 58 y.o. MRN: [id number]    Subjective:  Subjective  HPI Norma Kelley presents for feeling of dizziness that started over weekend and feeling of warm liquid being poured over her head.  + palpitations,  No chest pain , no sob    Her bp also went up   =---  First episode of high bp was last wed and then last night again.  She has been a little light headed .    She has had some panic attacks also --- nothing with those have changed +  Review of Systems  Constitutional: Negative for appetite change, diaphoresis, fatigue and unexpected weight change.  Eyes: Negative for pain, redness and visual disturbance.  Respiratory: Negative for cough, chest tightness, shortness of breath and wheezing.   Cardiovascular: Positive for palpitations. Negative for chest pain and leg swelling.  Endocrine: Negative for cold intolerance, heat intolerance, polydipsia, polyphagia and polyuria.  Genitourinary: Negative for difficulty urinating, dysuria and frequency.  Neurological: Positive for dizziness, light-headedness and headaches. Negative for tremors, syncope, facial asymmetry, weakness and numbness.    History Past Medical History:  Diagnosis Date  . Allergy    seasonal  . Anxiety   . Arthritis    fingers  . Breast cancer ([place]) 2007   left breast cancer  . Depression   . Diet-controlled diabetes mellitus ([place])    pt. denies 11/21/18  . GERD (gastroesophageal reflux disease)   . Hypothyroidism   . Thyroid disease     She has a past surgical history that includes Abdominal hysterectomy; Tonsillectomy; Breast surgery; Mastectomy (Left, 2007); Back surgery (2013); Breast lumpectomy with radioactive seed localization (Right, 08/17/2015); Breast excisional biopsy (Right, 08/17/2015); Colonoscopy; Polypectomy; and Back surgery.   Her family history includes Breast cancer (age of onset: 24) in her mother; Colon cancer in her maternal grandfather;  Colon polyps in her father; Diabetes in her mother; Head & neck cancer in her maternal grandmother; Heart attack (age of onset: 27) in her father; Heart attack (age of onset: 83) in her mother; Hyperlipidemia in her mother; Osteoporosis in her mother.She reports that she has quit smoking. Her smoking use included cigarettes. She has a 20.00 pack-year smoking history. She has never used smokeless tobacco. She reports that she does not drink alcohol or use drugs.  Current Outpatient Medications on File Prior to Visit  Medication Sig Dispense Refill  . ALPRAZolam (XANAX) 0.25 MG tablet Take 1 tablet (0.25 mg total) by mouth 3 (three) times daily as needed. 60 tablet 1  . aspirin 81 MG tablet Take 81 mg by mouth daily.    Marland Kitchen levothyroxine (SYNTHROID) 100 MCG tablet TAKE 1 TABLET(100 MCG) BY MOUTH DAILY 90 tablet 1  . Omeprazole ([facility] OMEPRAZOLE) 20 MG TBEC Take 1 tablet by mouth daily.     . valACYclovir (VALTREX) 500 MG tablet Take 1 tablet (500 mg total) by mouth daily as needed. 30 tablet 1   No current facility-administered medications on file prior to visit.     Objective:  Objective  Physical Exam Vitals and nursing note reviewed.  Constitutional:      Appearance: She is well-developed.  HENT:     Head: Normocephalic and atraumatic.  Eyes:     Conjunctiva/sclera: Conjunctivae normal.  Neck:     Thyroid: No thyromegaly.     Vascular: No carotid bruit or JVD.  Cardiovascular:     Rate and Rhythm: Normal rate  and regular rhythm.     Heart sounds: Normal heart sounds. No murmur.  Pulmonary:     Effort: Pulmonary effort is normal. No respiratory distress.     Breath sounds: Normal breath sounds. No wheezing or rales.  Chest:     Chest wall: No tenderness.  Musculoskeletal:        General: Normal range of motion.     Cervical back: Normal range of motion and neck supple.  Neurological:     Mental Status: She is alert and oriented to person, place, and time.    BP 140/90 (BP  Location: Left Arm, Patient Position: Sitting, Cuff Size: Normal)   Pulse 78   Temp 97.6 F (36.4 C) (Temporal)   Resp 18   Ht 5\' 6"  (1.676 m)   Wt 200 lb 3.2 oz (90.8 kg)   SpO2 98%   BMI 32.31 kg/m  Wt Readings from Last 3 Encounters:  04/22/19 200 lb 3.2 oz (90.8 kg)  01/21/19 195 lb 12.8 oz (88.8 kg)  12/05/18 194 lb (88 kg)     Lab Results  Component Value Date   WBC 8.0 01/21/2019   HGB 13.1 01/21/2019   HCT 39.6 01/21/2019   PLT 296.0 01/21/2019   GLUCOSE 87 01/21/2019   CHOL 168 01/21/2019   TRIG 122.0 01/21/2019   HDL 59.80 01/21/2019   LDLCALC 84 01/21/2019   ALT 15 01/21/2019   AST 17 01/21/2019   NA 140 01/21/2019   K 3.9 01/21/2019   CL 102 01/21/2019   CREATININE 0.75 01/21/2019   BUN 13 01/21/2019   CO2 28 01/21/2019   TSH 1.94 01/21/2019   HGBA1C 6.0 03/10/2017   MICROALBUR 0.4 07/16/2012    MM 3D SCREEN BREAST UNI RIGHT  Result Date: 07/18/2018 CLINICAL DATA:  Screening. EXAM: DIGITAL SCREENING UNILATERAL RIGHT MAMMOGRAM WITH CAD AND TOMO COMPARISON:  Previous exam(s). ACR Breast Density Category b: There are scattered areas of fibroglandular density. FINDINGS: The patient has had a left mastectomy. There are no findings suspicious for malignancy. Images were processed with CAD. IMPRESSION: No mammographic evidence of malignancy. A result letter of this screening mammogram will be mailed directly to the patient. RECOMMENDATION: Screening mammogram in one year.  (Code:SM-R-58M) BI-RADS CATEGORY  1: Negative. Electronically Signed   By: Claudie Revering M.D.   On: 07/18/2018 10:39     Assessment & Plan:  Plan  I am having Jezelle A. Rahming start on lisinopril. I am also having her maintain her Omeprazole, aspirin, levothyroxine, valACYclovir, and ALPRAZolam.  Meds ordered this encounter  Medications  . lisinopril (ZESTRIL) 10 MG tablet    Sig: Take 1 tablet (10 mg total) by mouth daily.    Dispense:  90 tablet    Refill:  3    Problem List Items  Addressed This Visit      Unprioritized   Atypical chest pain   Relevant Orders   CBC with Differential   Comprehensive metabolic panel   Thyroid Panel With TSH   POCT Urinalysis Dipstick (Automated)   Troponin I (Completed)   Essential hypertension - Primary    Poorly controlled will alter medications, encouraged DASH diet, minimize caffeine and obtain adequate sleep. Report concerning symptoms and follow up as directed and as needed \\start  lisinopril Recheck 2-3 weeks      Relevant Medications   lisinopril (ZESTRIL) 10 MG tablet   Other Relevant Orders   CBC with Differential   Comprehensive metabolic panel   Thyroid Panel With TSH  POCT Urinalysis Dipstick (Automated)   Palpitation    ekg-- NSR Check labs  Consider event monitor and echo      Relevant Orders   EKG 12-Lead (Completed)   POCT Urinalysis Dipstick (Automated)      Follow-up: Return in about 3 weeks (around 05/13/2019), or if symptoms worsen or fail to improve, for hypertension.  Ann Held, DO

## 2019-04-22 NOTE — Assessment & Plan Note (Signed)
ekg-- NSR Check labs  Consider event monitor and echo

## 2019-04-22 NOTE — Assessment & Plan Note (Signed)
Poorly controlled will alter medications, encouraged DASH diet, minimize caffeine and obtain adequate sleep. Report concerning symptoms and follow up as directed and as needed \\start  lisinopril Recheck 2-3 weeks

## 2019-04-23 ENCOUNTER — Encounter: Payer: Self-pay | Admitting: Family Medicine

## 2019-04-23 LAB — COMPREHENSIVE METABOLIC PANEL
ALT: 14 U/L (ref 0–35)
AST: 16 U/L (ref 0–37)
Albumin: 5 g/dL (ref 3.5–5.2)
Alkaline Phosphatase: 93 U/L (ref 39–117)
BUN: 13 mg/dL (ref 6–23)
CO2: 28 mEq/L (ref 19–32)
Calcium: 10 mg/dL (ref 8.4–10.5)
Chloride: 103 mEq/L (ref 96–112)
Creatinine, Ser: 0.91 mg/dL (ref 0.40–1.20)
GFR: 62.92 mL/min (ref >60.00)
Glucose, Bld: 89 mg/dL (ref 70–99)
Potassium: 3.9 mEq/L (ref 3.5–5.1)
Sodium: 139 mEq/L (ref 135–145)
Total Bilirubin: 0.7 mg/dL (ref 0.2–1.2)
Total Protein: 7.5 g/dL (ref 6.0–8.3)

## 2019-04-23 LAB — CBC WITH DIFFERENTIAL/PLATELET
Basophils Absolute: 0.1 10*3/uL (ref 0.0–0.1)
Basophils Relative: 0.8 % (ref 0.0–3.0)
Eosinophils Absolute: 0.1 10*3/uL (ref 0.0–0.7)
Eosinophils Relative: 1.1 % (ref 0.0–5.0)
HCT: 40.4 % (ref 36.0–46.0)
Hemoglobin: 13.5 g/dL (ref 12.0–15.0)
Lymphocytes Relative: 22 % (ref 12.0–46.0)
Lymphs Abs: 2 10*3/uL (ref 0.7–4.0)
MCHC: 33.3 g/dL (ref 30.0–36.0)
MCV: 88.6 fl (ref 78.0–100.0)
Monocytes Absolute: 0.5 10*3/uL (ref 0.1–1.0)
Monocytes Relative: 5.5 % (ref 3.0–12.0)
Neutro Abs: 6.5 10*3/uL (ref 1.4–7.7)
Neutrophils Relative %: 70.6 % (ref 43.0–77.0)
Platelets: 309 10*3/uL (ref 150.0–400.0)
RBC: 4.56 Mil/uL (ref 3.87–5.11)
RDW: 13.4 % (ref 11.5–15.5)
WBC: 9.2 10*3/uL (ref 4.0–10.5)

## 2019-04-23 LAB — THYROID PANEL WITH TSH
Free Thyroxine Index: 2.9 (ref 1.4–3.8)
T3 Uptake: 28 % (ref 22–35)
T4, Total: 10.4 ug/dL (ref 5.1–11.9)
TSH: 3.25 mIU/L (ref 0.40–4.50)

## 2019-04-23 LAB — TROPONIN I: Troponin I: <0.01 ng/mL (ref <=0.0)

## 2019-05-14 ENCOUNTER — Encounter: Payer: Self-pay | Admitting: Family Medicine

## 2019-05-14 NOTE — Telephone Encounter (Signed)
I am actually working from home Friday so we can change her to virtual

## 2019-05-17 ENCOUNTER — Ambulatory Visit (INDEPENDENT_AMBULATORY_CARE_PROVIDER_SITE_OTHER): Payer: BC Managed Care – PPO | Admitting: Family Medicine

## 2019-05-17 ENCOUNTER — Encounter: Payer: Self-pay | Admitting: Family Medicine

## 2019-05-17 ENCOUNTER — Other Ambulatory Visit: Payer: Self-pay

## 2019-05-17 VITALS — BP 122/76 | HR 70

## 2019-05-17 DIAGNOSIS — F411 Generalized anxiety disorder: Secondary | ICD-10-CM | POA: Diagnosis not present

## 2019-05-17 DIAGNOSIS — E039 Hypothyroidism, unspecified: Secondary | ICD-10-CM | POA: Diagnosis not present

## 2019-05-17 DIAGNOSIS — I10 Essential (primary) hypertension: Secondary | ICD-10-CM

## 2019-05-17 DIAGNOSIS — R002 Palpitations: Secondary | ICD-10-CM | POA: Diagnosis not present

## 2019-05-17 NOTE — Progress Notes (Signed)
Virtual Visit via Video Note  I connected with Norma Kelley on 05/17/19 at  9:00 AM EST by a video enabled telemedicine application and verified that I am speaking with the correct person using two identifiers.  Location: Patient: home alone  Provider: home    I discussed the limitations of evaluation and management by telemedicine and the availability of in person appointments. The patient expressed understanding and agreed to proceed.  History of Present Illness: Pt is home with no complaints.  F/u bp  , thyroid and anxiety.   Past Medical History:  Diagnosis Date  . Allergy    seasonal  . Anxiety   . Arthritis    fingers  . Breast cancer ([place]) 2007   left breast cancer  . Depression   . Diet-controlled diabetes mellitus ([place])    pt. denies 11/21/18  . GERD (gastroesophageal reflux disease)   . Hypothyroidism   . Thyroid disease    Current Outpatient Medications on File Prior to Visit  Medication Sig Dispense Refill  . ALPRAZolam (XANAX) 0.25 MG tablet Take 1 tablet (0.25 mg total) by mouth 3 (three) times daily as needed. 60 tablet 1  . aspirin 81 MG tablet Take 81 mg by mouth daily.    Marland Kitchen levothyroxine (SYNTHROID) 100 MCG tablet TAKE 1 TABLET(100 MCG) BY MOUTH DAILY 90 tablet 1  . lisinopril (ZESTRIL) 10 MG tablet Take 1 tablet (10 mg total) by mouth daily. 90 tablet 3  . Omeprazole ([facility] OMEPRAZOLE) 20 MG TBEC Take 1 tablet by mouth daily.     . valACYclovir (VALTREX) 500 MG tablet Take 1 tablet (500 mg total) by mouth daily as needed. 30 tablet 1   No current facility-administered medications on file prior to visit.   Allergies  Allergen Reactions  . Sulfamethoxazole Itching    Swelling around eyes  . Sulfonamide Derivatives    Past Surgical History:  Procedure Laterality Date  . ABDOMINAL HYSTERECTOMY    . BACK SURGERY  2013   lumbar  . BACK SURGERY    . 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 BREAST LUMPECTOMY WITH RADIOACTIVE SEED LOCALIZATION  Right 08/17/2015   Procedure: BREAST LUMPECTOMY WITH RADIOACTIVE SEED LOCALIZATION;  Surgeon: Excell Seltzer, MD;  Location: [facility];  Service: General;  Laterality: Right;  . BREAST SURGERY     Mastectomy and reconstruction  . COLONOSCOPY    . MASTECTOMY Left 2007   mastectomy and tamoxifen  . POLYPECTOMY    . TONSILLECTOMY     Observations/Objective: Vitals:   05/17/19 0856  BP: 122/76  Pulse: 70  pt is in NAD   Assessment and Plan: 1. Palpitations Check event monitor  Consider cardiology  - Cardiac event monitor; Future  2. Anxiety state Stable con't meds     3. Hypothyroidism, unspecified type Stable Lab Results  Component Value Date   TSH 3.25 04/22/2019   con't meds   4. Essential hypertension Well controlled, no changes to meds. Encouraged heart healthy diet such as the DASH diet and exercise as tolerated.   Follow Up Instructions:    I discussed the assessment and treatment plan with the patient. The patient was provided an opportunity to ask questions and all were answered. The patient agreed with the plan and demonstrated an understanding of the instructions.   The patient was advised to call back or seek an in-person evaluation if the symptoms worsen or if the condition fails to improve as anticipated.  Ann Held, DO

## 2019-05-17 NOTE — Assessment & Plan Note (Signed)
Stable con't meds 

## 2019-05-17 NOTE — Assessment & Plan Note (Addendum)
Stable Lab Results  Component Value Date   TSH 3.25 04/22/2019   con't meds

## 2019-05-17 NOTE — Assessment & Plan Note (Signed)
Well controlled, no changes to meds. Encouraged heart healthy diet such as the DASH diet and exercise as tolerated.  °

## 2019-05-17 NOTE — Assessment & Plan Note (Signed)
Check event monitor  Consider cardiology

## 2019-05-23 ENCOUNTER — Encounter: Payer: Self-pay | Admitting: Family Medicine

## 2019-05-24 ENCOUNTER — Telehealth: Payer: Self-pay | Admitting: Radiology

## 2019-05-24 NOTE — Telephone Encounter (Signed)
Enrolled patient for a 30 day [employer] Cardiac Event Monitor to be mailed to patients home. Brief instructions were gone over with patient and she knows to expect the monitor to arrive in 5-7 days.

## 2019-05-30 ENCOUNTER — Encounter (INDEPENDENT_AMBULATORY_CARE_PROVIDER_SITE_OTHER): Payer: BC Managed Care – PPO

## 2019-05-30 DIAGNOSIS — R002 Palpitations: Secondary | ICD-10-CM | POA: Diagnosis not present

## 2019-06-03 ENCOUNTER — Other Ambulatory Visit: Payer: Self-pay | Admitting: Family Medicine

## 2019-06-03 DIAGNOSIS — Z1231 Encounter for screening mammogram for malignant neoplasm of breast: Secondary | ICD-10-CM

## 2019-06-10 ENCOUNTER — Encounter: Payer: Self-pay | Admitting: Family Medicine

## 2019-06-11 NOTE — Telephone Encounter (Signed)
Its a 30 day test usually

## 2019-07-18 ENCOUNTER — Ambulatory Visit
Admission: RE | Admit: 2019-07-18 | Discharge: 2019-07-18 | Disposition: A | Discharge status: Home / Self Care | Payer: BC Managed Care – PPO | Source: Ambulatory Visit | Attending: Family Medicine | Admitting: Family Medicine

## 2019-07-18 ENCOUNTER — Other Ambulatory Visit: Payer: Self-pay

## 2019-07-18 DIAGNOSIS — Z1231 Encounter for screening mammogram for malignant neoplasm of breast: Secondary | ICD-10-CM

## 2019-07-19 ENCOUNTER — Other Ambulatory Visit: Payer: Self-pay

## 2019-07-22 ENCOUNTER — Encounter: Payer: Self-pay | Admitting: Family Medicine

## 2019-07-22 ENCOUNTER — Ambulatory Visit: Payer: BC Managed Care – PPO | Admitting: Family Medicine

## 2019-07-22 ENCOUNTER — Other Ambulatory Visit: Payer: Self-pay

## 2019-07-22 VITALS — BP 120/70 | HR 70 | Temp 97.3°F | Resp 18 | Ht 66.0 in | Wt 203.2 lb

## 2019-07-22 DIAGNOSIS — F411 Generalized anxiety disorder: Secondary | ICD-10-CM | POA: Diagnosis not present

## 2019-07-22 DIAGNOSIS — E785 Hyperlipidemia, unspecified: Secondary | ICD-10-CM

## 2019-07-22 DIAGNOSIS — E039 Hypothyroidism, unspecified: Secondary | ICD-10-CM | POA: Diagnosis not present

## 2019-07-22 DIAGNOSIS — I10 Essential (primary) hypertension: Secondary | ICD-10-CM | POA: Diagnosis not present

## 2019-07-22 LAB — COMPREHENSIVE METABOLIC PANEL
ALT: 14 U/L (ref 0–35)
AST: 14 U/L (ref 0–37)
Albumin: 4.4 g/dL (ref 3.5–5.2)
Alkaline Phosphatase: 81 U/L (ref 39–117)
BUN: 14 mg/dL (ref 6–23)
CO2: 29 mEq/L (ref 19–32)
Calcium: 9.5 mg/dL (ref 8.4–10.5)
Chloride: 105 mEq/L (ref 96–112)
Creatinine, Ser: 0.72 mg/dL (ref 0.40–1.20)
GFR: 82.37 mL/min (ref >60.00)
Glucose, Bld: 92 mg/dL (ref 70–99)
Potassium: 4.4 mEq/L (ref 3.5–5.1)
Sodium: 140 mEq/L (ref 135–145)
Total Bilirubin: 0.6 mg/dL (ref 0.2–1.2)
Total Protein: 6.8 g/dL (ref 6.0–8.3)

## 2019-07-22 LAB — LIPID PANEL
Cholesterol: 154 mg/dL (ref 0–200)
HDL: 52.5 mg/dL (ref >39.00)
LDL Cholesterol: 69 mg/dL (ref 0–99)
NonHDL: 101.72
Total CHOL/HDL Ratio: 3
Triglycerides: 166 mg/dL — ABNORMAL HIGH (ref 0.0–149.0)
VLDL: 33.2 mg/dL (ref 0.0–40.0)

## 2019-07-22 NOTE — Progress Notes (Signed)
Patient ID: Norma Kelley, female    DOB: 15-Dec-1960  Age: 59 y.o. MRN: [id number]    Subjective:  Subjective  HPI Norma Kelley presents for f/u thyroid, bp and chol//  Pt has had J&J covid vaccine No complaints   Review of Systems  Constitutional: Negative for appetite change, diaphoresis, fatigue and unexpected weight change.  Eyes: Negative for pain, redness and visual disturbance.  Respiratory: Negative for cough, chest tightness, shortness of breath and wheezing.   Cardiovascular: Negative for chest pain, palpitations and leg swelling.  Endocrine: Negative for cold intolerance, heat intolerance, polydipsia, polyphagia and polyuria.  Genitourinary: Negative for difficulty urinating, dysuria and frequency.  Neurological: Negative for dizziness, light-headedness, numbness and headaches.    History Past Medical History:  Diagnosis Date  . Allergy    seasonal  . Anxiety   . Arthritis    fingers  . Breast cancer (Norma Kelley) 2007   left breast cancer  . Depression   . Diet-controlled diabetes mellitus ([place])    pt. denies 11/21/18  . GERD (gastroesophageal reflux disease)   . Hypothyroidism   . Thyroid disease     She has a past surgical history that includes Abdominal hysterectomy; Tonsillectomy; Breast surgery; Mastectomy (Left, 2007); Back surgery (2013); Breast lumpectomy with radioactive seed localization (Right, 08/17/2015); Breast excisional biopsy (Right, 08/17/2015); Colonoscopy; Polypectomy; and Back surgery.   Her family history includes Breast cancer (age of onset: 28) in her mother; Colon cancer in her maternal grandfather; Colon polyps in her father; Diabetes in her mother; Head & neck cancer in her maternal grandmother; Heart attack (age of onset: 59) in her father; Heart attack (age of onset: 6) in her mother; Hyperlipidemia in her mother; Osteoporosis in her mother.She reports that she has quit smoking. Her smoking use included cigarettes. She has a 20.00 pack-year  smoking history. She has never used smokeless tobacco. She reports that she does not drink alcohol or use drugs.  Current Outpatient Medications on File Prior to Visit  Medication Sig Dispense Refill  . ALPRAZolam (XANAX) 0.25 MG tablet Take 1 tablet (0.25 mg total) by mouth 3 (three) times daily as needed. 60 tablet 1  . aspirin 81 MG tablet Take 81 mg by mouth daily.    Marland Kitchen levothyroxine (SYNTHROID) 100 MCG tablet TAKE 1 TABLET(100 MCG) BY MOUTH DAILY 90 tablet 1  . lisinopril (ZESTRIL) 10 MG tablet Take 1 tablet (10 mg total) by mouth daily. 90 tablet 3  . Omeprazole ([facility] OMEPRAZOLE) 20 MG TBEC Take 1 tablet by mouth daily.     . valACYclovir (VALTREX) 500 MG tablet Take 1 tablet (500 mg total) by mouth daily as needed. 30 tablet 1   No current facility-administered medications on file prior to visit.     Objective:  Objective  Physical Exam Vitals and nursing note reviewed.  Constitutional:      Appearance: She is well-developed.  HENT:     Head: Normocephalic and atraumatic.  Eyes:     Conjunctiva/sclera: Conjunctivae normal.  Neck:     Thyroid: No thyromegaly.     Vascular: No carotid bruit or JVD.  Cardiovascular:     Rate and Rhythm: Normal rate and regular rhythm.     Heart sounds: Normal heart sounds. No murmur.  Pulmonary:     Effort: Pulmonary effort is normal. No respiratory distress.     Breath sounds: Normal breath sounds. No wheezing or rales.  Chest:     Chest wall: No tenderness.  Musculoskeletal:  Cervical back: Normal range of motion and neck supple.  Neurological:     Mental Status: She is alert and oriented to person, place, and time.    BP 120/70 (BP Location: Right Arm, Patient Position: Sitting, Cuff Size: Large)   Pulse 70   Temp (!) 97.3 F (36.3 C) (Temporal)   Resp 18   Ht 5\' 6"  (1.676 m)   Wt 203 lb 3.2 oz (92.2 kg)   SpO2 98%   BMI 32.80 kg/m  Wt Readings from Last 3 Encounters:  07/22/19 203 lb 3.2 oz (92.2 kg)  04/22/19 200 lb 3.2  oz (90.8 kg)  01/21/19 195 lb 12.8 oz (88.8 kg)     Lab Results  Component Value Date   WBC 9.2 04/22/2019   HGB 13.5 04/22/2019   HCT 40.4 04/22/2019   PLT 309.0 04/22/2019   GLUCOSE 89 04/22/2019   CHOL 168 01/21/2019   TRIG 122.0 01/21/2019   HDL 59.80 01/21/2019   LDLCALC 84 01/21/2019   ALT 14 04/22/2019   AST 16 04/22/2019   NA 139 04/22/2019   K 3.9 04/22/2019   CL 103 04/22/2019   CREATININE 0.91 04/22/2019   BUN 13 04/22/2019   CO2 28 04/22/2019   TSH 3.25 04/22/2019   HGBA1C 6.0 03/10/2017   MICROALBUR 0.4 07/16/2012    MM 3D SCREEN BREAST UNI RIGHT  Result Date: 07/18/2019 CLINICAL DATA:  Screening. History of left mastectomy. EXAM: DIGITAL SCREENING UNILATERAL RIGHT MAMMOGRAM WITH CAD AND TOMO COMPARISON:  Previous exam(s). ACR Breast Density Category b: There are scattered areas of fibroglandular density. FINDINGS: There are no findings suspicious for malignancy. Postsurgical changes are seen in the right breast. Images were processed with CAD. IMPRESSION: No mammographic evidence of malignancy. A result letter of this screening mammogram will be mailed directly to the patient. RECOMMENDATION: Screening mammogram in one year. (Code:[id number]) BI-RADS CATEGORY  2: Benign. Electronically Signed   By: Zerita Boers M.D.   On: 07/18/2019 17:17     Assessment & Plan:  Plan  I am having Catheleen A. Sandstrom maintain her Omeprazole, aspirin, levothyroxine, valACYclovir, ALPRAZolam, and lisinopril.  No orders of the defined types were placed in this encounter.   Problem List Items Addressed This Visit      Unprioritized   Anxiety state    Stable--   con't prn xanax      Essential hypertension - Primary    Well controlled, no changes to meds. Encouraged heart healthy diet such as the DASH diet and exercise as tolerated.       Relevant Orders   Lipid panel   Comprehensive metabolic panel   Hypothyroidism    This SmartLink has not been configured with any valid  records.   Lab Results  Component Value Date   TSH 3.25 04/22/2019          Other Visit Diagnoses    Hyperlipidemia, unspecified hyperlipidemia type       Relevant Orders   Lipid panel   Comprehensive metabolic panel      Follow-up: Return in about 6 months (around 01/22/2020), or if symptoms worsen or fail to improve, for annual exam, fasting.  Ann Held, DO

## 2019-07-22 NOTE — Assessment & Plan Note (Signed)
Well controlled, no changes to meds. Encouraged heart healthy diet such as the DASH diet and exercise as tolerated.  °

## 2019-07-22 NOTE — Patient Instructions (Signed)
DASH Eating Plan DASH stands for "Dietary Approaches to Stop Hypertension." The DASH eating plan is a healthy eating plan that has been shown to reduce high blood pressure (hypertension). It may also reduce your risk for type 2 diabetes, heart disease, and stroke. The DASH eating plan may also help with weight loss. What are tips for following this plan?  General guidelines  Avoid eating more than 2,300 mg (milligrams) of salt (sodium) a day. If you have hypertension, you may need to reduce your sodium intake to 1,500 mg a day.  Limit alcohol intake to no more than 1 drink a day for nonpregnant women and 2 drinks a day for men. One drink equals 12 oz of beer, 5 oz of wine, or 1 oz of hard liquor.  Work with your health care provider to maintain a healthy body weight or to lose weight. Ask what an ideal weight is for you.  Get at least 30 minutes of exercise that causes your heart to beat faster (aerobic exercise) most days of the week. Activities may include walking, swimming, or biking.  Work with your health care provider or diet and nutrition specialist (dietitian) to adjust your eating plan to your individual calorie needs. Reading food labels   Check food labels for the amount of sodium per serving. Choose foods with less than 5 percent of the Daily Value of sodium. Generally, foods with less than 300 mg of sodium per serving fit into this eating plan.  To find whole grains, look for the word "whole" as the first word in the ingredient list. Shopping  Buy products labeled as "low-sodium" or "no salt added."  Buy fresh foods. Avoid canned foods and premade or frozen meals. Cooking  Avoid adding salt when cooking. Use salt-free seasonings or herbs instead of table salt or sea salt. Check with your health care provider or pharmacist before using salt substitutes.  Do not fry foods. Cook foods using healthy methods such as baking, boiling, grilling, and broiling instead.  Cook with  heart-healthy oils, such as olive, canola, soybean, or sunflower oil. Meal planning  Eat a balanced diet that includes: ? 5 or more servings of fruits and vegetables each day. At each meal, try to fill half of your plate with fruits and vegetables. ? Up to 6-8 servings of whole grains each day. ? Less than 6 oz of lean meat, poultry, or fish each day. A 3-oz serving of meat is about the same size as a deck of cards. One egg equals 1 oz. ? 2 servings of low-fat dairy each day. ? A serving of nuts, seeds, or beans 5 times each week. ? Heart-healthy fats. Healthy fats called Omega-3 fatty acids are found in foods such as flaxseeds and coldwater fish, like sardines, salmon, and mackerel.  Limit how much you eat of the following: ? Canned or prepackaged foods. ? Food that is high in trans fat, such as fried foods. ? Food that is high in saturated fat, such as fatty meat. ? Sweets, desserts, sugary drinks, and other foods with added sugar. ? Full-fat dairy products.  Do not salt foods before eating.  Try to eat at least 2 vegetarian meals each week.  Eat more home-cooked food and less restaurant, buffet, and fast food.  When eating at a restaurant, ask that your food be prepared with less salt or no salt, if possible. What foods are recommended? The items listed may not be a complete list. Talk with your dietitian about   what dietary choices are best for you. Grains Whole-grain or whole-wheat bread. Whole-grain or whole-wheat pasta. Brown rice. Oatmeal. Quinoa. Bulgur. Whole-grain and low-sodium cereals. Pita bread. Low-fat, low-sodium crackers. Whole-wheat flour tortillas. Vegetables Fresh or frozen vegetables (raw, steamed, roasted, or grilled). Low-sodium or reduced-sodium tomato and vegetable juice. Low-sodium or reduced-sodium tomato sauce and tomato paste. Low-sodium or reduced-sodium canned vegetables. Fruits All fresh, dried, or frozen fruit. Canned fruit in natural juice (without  added sugar). Meat and other protein foods Skinless chicken or turkey. Ground chicken or turkey. Pork with fat trimmed off. Fish and seafood. Egg whites. Dried beans, peas, or lentils. Unsalted nuts, nut butters, and seeds. Unsalted canned beans. Lean cuts of beef with fat trimmed off. Low-sodium, lean deli meat. Dairy Low-fat (1%) or fat-free (skim) milk. Fat-free, low-fat, or reduced-fat cheeses. Nonfat, low-sodium ricotta or cottage cheese. Low-fat or nonfat yogurt. Low-fat, low-sodium cheese. Fats and oils Soft margarine without trans fats. Vegetable oil. Low-fat, reduced-fat, or light mayonnaise and salad dressings (reduced-sodium). Canola, safflower, olive, soybean, and sunflower oils. Avocado. Seasoning and other foods Herbs. Spices. Seasoning mixes without salt. Unsalted popcorn and pretzels. Fat-free sweets. What foods are not recommended? The items listed may not be a complete list. Talk with your dietitian about what dietary choices are best for you. Grains Baked goods made with fat, such as croissants, muffins, or some breads. Dry pasta or rice meal packs. Vegetables Creamed or fried vegetables. Vegetables in a cheese sauce. Regular canned vegetables (not low-sodium or reduced-sodium). Regular canned tomato sauce and paste (not low-sodium or reduced-sodium). Regular tomato and vegetable juice (not low-sodium or reduced-sodium). Pickles. Olives. Fruits Canned fruit in a light or heavy syrup. Fried fruit. Fruit in cream or butter sauce. Meat and other protein foods Fatty cuts of meat. Ribs. Fried meat. Bacon. Sausage. Bologna and other processed lunch meats. Salami. Fatback. Hotdogs. Bratwurst. Salted nuts and seeds. Canned beans with added salt. Canned or smoked fish. Whole eggs or egg yolks. Chicken or turkey with skin. Dairy Whole or 2% milk, cream, and half-and-half. Whole or full-fat cream cheese. Whole-fat or sweetened yogurt. Full-fat cheese. Nondairy creamers. Whipped toppings.  Processed cheese and cheese spreads. Fats and oils Butter. Stick margarine. Lard. Shortening. Ghee. Bacon fat. Tropical oils, such as coconut, palm kernel, or palm oil. Seasoning and other foods Salted popcorn and pretzels. Onion salt, garlic salt, seasoned salt, table salt, and sea salt. Worcestershire sauce. Tartar sauce. Barbecue sauce. Teriyaki sauce. Soy sauce, including reduced-sodium. Steak sauce. Canned and packaged gravies. Fish sauce. Oyster sauce. Cocktail sauce. Horseradish that you find on the shelf. Ketchup. Mustard. Meat flavorings and tenderizers. Bouillon cubes. Hot sauce and Tabasco sauce. Premade or packaged marinades. Premade or packaged taco seasonings. Relishes. Regular salad dressings. Where to find more information:  National Heart, Lung, and Blood Institute: www.nhlbi.nih.gov  American Heart Association: www.heart.org Summary  The DASH eating plan is a healthy eating plan that has been shown to reduce high blood pressure (hypertension). It may also reduce your risk for type 2 diabetes, heart disease, and stroke.  With the DASH eating plan, you should limit salt (sodium) intake to 2,300 mg a day. If you have hypertension, you may need to reduce your sodium intake to 1,500 mg a day.  When on the DASH eating plan, aim to eat more fresh fruits and vegetables, whole grains, lean proteins, low-fat dairy, and heart-healthy fats.  Work with your health care provider or diet and nutrition specialist (dietitian) to adjust your eating plan to your   individual calorie needs. This information is not intended to replace advice given to you by your health care provider. Make sure you discuss any questions you have with your health care provider. Document Revised: 04/07/2017 Document Reviewed: 04/18/2016 Elsevier Patient Education  2020 [employer] Inc.  

## 2019-07-22 NOTE — Assessment & Plan Note (Signed)
This SmartLink has not been configured with any valid records.   Lab Results  Component Value Date   TSH 3.25 04/22/2019

## 2019-07-22 NOTE — Assessment & Plan Note (Signed)
Stable  con't prn xanax  

## 2019-08-25 ENCOUNTER — Encounter: Payer: Self-pay | Admitting: Family Medicine

## 2019-08-26 ENCOUNTER — Other Ambulatory Visit: Payer: Self-pay

## 2019-08-26 DIAGNOSIS — F419 Anxiety disorder, unspecified: Secondary | ICD-10-CM

## 2019-08-26 DIAGNOSIS — F329 Major depressive disorder, single episode, unspecified: Secondary | ICD-10-CM

## 2019-08-26 MED ORDER — ALPRAZOLAM 0.25 MG PO TABS: 0.2500 mg | ORAL_TABLET | Freq: Three times a day (TID) | ORAL | 1 refills | Status: DC | PRN

## 2019-08-26 NOTE — Telephone Encounter (Signed)
Alprazolam refill.   Last OV: 07/22/2019  Last Fill: 04/17/2019 #60 and 1RF Pt sig: 1 tab tid prn UDS: 01/21/2019 Low risk

## 2019-09-03 ENCOUNTER — Other Ambulatory Visit: Payer: Self-pay | Admitting: *Deleted

## 2019-09-03 DIAGNOSIS — E039 Hypothyroidism, unspecified: Secondary | ICD-10-CM

## 2019-09-03 MED ORDER — LEVOTHYROXINE SODIUM 100 MCG PO TABS: ORAL_TABLET | ORAL | 1 refills | Status: DC

## 2019-11-05 ENCOUNTER — Encounter: Payer: Self-pay | Admitting: Family Medicine

## 2019-11-11 ENCOUNTER — Encounter: Payer: Self-pay | Admitting: Family Medicine

## 2019-11-11 DIAGNOSIS — F419 Anxiety disorder, unspecified: Secondary | ICD-10-CM

## 2019-11-12 MED ORDER — ALPRAZOLAM 0.25 MG PO TABS: 0.2500 mg | ORAL_TABLET | Freq: Three times a day (TID) | ORAL | 0 refills | Status: DC | PRN

## 2019-11-12 NOTE — Telephone Encounter (Signed)
Alprazolam refill.  Last OV: 07/22/2019 Last Fill: 08/26/2019 #60 and 1RF Pt sig: 1 tab tid prn UDS: 01/21/2019 Low risk

## 2019-11-15 ENCOUNTER — Encounter: Payer: Self-pay | Admitting: Family Medicine

## 2019-11-15 DIAGNOSIS — R002 Palpitations: Secondary | ICD-10-CM

## 2019-11-18 NOTE — Telephone Encounter (Signed)
Please advise 

## 2019-11-18 NOTE — Telephone Encounter (Signed)
Ok to refer to cardiology for palpitations  Does she need ov to discuss anxiety?

## 2019-11-18 NOTE — Telephone Encounter (Signed)
We can try lexapro 10 mg #30  1 po qhs, 2 refills and have her f/u with me in 1 month or sooner prn

## 2019-11-19 NOTE — Telephone Encounter (Signed)
FYI

## 2019-11-28 ENCOUNTER — Ambulatory Visit: Payer: BC Managed Care – PPO | Admitting: Family Medicine

## 2019-11-28 ENCOUNTER — Other Ambulatory Visit: Payer: Self-pay

## 2019-11-28 ENCOUNTER — Encounter: Payer: Self-pay | Admitting: Family Medicine

## 2019-11-28 VITALS — BP 110/70 | HR 72 | Temp 98.2°F | Resp 18 | Ht 66.0 in | Wt 196.8 lb

## 2019-11-28 DIAGNOSIS — I10 Essential (primary) hypertension: Secondary | ICD-10-CM | POA: Diagnosis not present

## 2019-11-28 DIAGNOSIS — R002 Palpitations: Secondary | ICD-10-CM

## 2019-11-28 MED ORDER — METOPROLOL SUCCINATE ER 50 MG PO TB24: 50.0000 mg | ORAL_TABLET | Freq: Every day | ORAL | 3 refills | Status: DC

## 2019-11-28 NOTE — Patient Instructions (Signed)

## 2019-11-28 NOTE — Progress Notes (Signed)
Patient ID: Norma Kelley, female    DOB: Dec 27, 1960  Age: 59 y.o. MRN: [id number]    Subjective:  Subjective  HPI Norma Kelley presents for f/u palpitations and bp.  She thought her bp was high but it was not.   She also admits struggling with anxiety but does not want to take medication.    Review of Systems  Constitutional: Negative for appetite change, diaphoresis, fatigue and unexpected weight change.  Eyes: Negative for pain, redness and visual disturbance.  Respiratory: Negative for cough, chest tightness, shortness of breath and wheezing.   Cardiovascular: Positive for palpitations. Negative for chest pain and leg swelling.  Endocrine: Negative for cold intolerance, heat intolerance, polydipsia, polyphagia and polyuria.  Genitourinary: Negative for difficulty urinating, dysuria and frequency.  Neurological: Negative for dizziness, light-headedness, numbness and headaches.  Psychiatric/Behavioral: Negative for confusion. The patient is nervous/anxious.     History Past Medical History:  Diagnosis Date  . Allergy    seasonal  . Anxiety   . Arthritis    fingers  . Breast cancer ([place]) 2007   left breast cancer  . Depression   . Diet-controlled diabetes mellitus ([place])    pt. denies 11/21/18  . GERD (gastroesophageal reflux disease)   . Hypothyroidism   . Thyroid disease     She has a past surgical history that includes Abdominal hysterectomy; Tonsillectomy; Breast surgery; Mastectomy (Left, 2007); Back surgery (2013); Breast lumpectomy with radioactive seed localization (Right, 08/17/2015); Breast excisional biopsy (Right, 08/17/2015); Colonoscopy; Polypectomy; and Back surgery.   Her family history includes Breast cancer (age of onset: 82) in her mother; Colon cancer in her maternal grandfather; Colon polyps in her father; Diabetes in her mother; Head & neck cancer in her maternal grandmother; Heart attack (age of onset: 35) in her father; Heart attack (age of onset: 69) in  her mother; Hyperlipidemia in her mother; Osteoporosis in her mother.She reports that she has quit smoking. Her smoking use included cigarettes. She has a 20.00 pack-year smoking history. She has never used smokeless tobacco. She reports that she does not drink alcohol and does not use drugs.  Current Outpatient Medications on File Prior to Visit  Medication Sig Dispense Refill  . ALPRAZolam (XANAX) 0.25 MG tablet Take 1 tablet (0.25 mg total) by mouth 3 (three) times daily as needed for anxiety. 60 tablet 0  . aspirin 81 MG tablet Take 81 mg by mouth daily.    Marland Kitchen levothyroxine (SYNTHROID) 100 MCG tablet TAKE 1 TABLET(100 MCG) BY MOUTH DAILY 90 tablet 1  . Omeprazole ([facility] OMEPRAZOLE) 20 MG TBEC Take 1 tablet by mouth daily.     . valACYclovir (VALTREX) 500 MG tablet Take 1 tablet (500 mg total) by mouth daily as needed. 30 tablet 1   No current facility-administered medications on file prior to visit.     Objective:  Objective  Physical Exam Vitals and nursing note reviewed.  Constitutional:      Appearance: She is well-developed.  HENT:     Head: Normocephalic and atraumatic.  Eyes:     Conjunctiva/sclera: Conjunctivae normal.  Neck:     Thyroid: No thyromegaly.     Vascular: No carotid bruit or JVD.  Cardiovascular:     Rate and Rhythm: Normal rate and regular rhythm.     Heart sounds: Normal heart sounds. No murmur heard.   Pulmonary:     Effort: Pulmonary effort is normal. No respiratory distress.     Breath sounds: Normal breath sounds. No  wheezing or rales.  Chest:     Chest wall: No tenderness.  Musculoskeletal:     Cervical back: Normal range of motion and neck supple.  Neurological:     Mental Status: She is alert and oriented to person, place, and time.  Psychiatric:        Attention and Perception: Attention and perception normal.        Mood and Affect: Mood is anxious. Affect is not tearful or inappropriate.        Cognition and Memory: Cognition and memory  normal.        Judgment: Judgment normal.    BP 110/70 (BP Location: Right Arm, Patient Position: Sitting, Cuff Size: Normal)   Pulse 72   Temp 98.2 F (36.8 C) (Oral)   Resp 18   Ht 5\' 6"  (1.676 m)   Wt 196 lb 12.8 oz (89.3 kg)   SpO2 97%   BMI 31.76 kg/m  Wt Readings from Last 3 Encounters:  11/28/19 196 lb 12.8 oz (89.3 kg)  07/22/19 203 lb 3.2 oz (92.2 kg)  04/22/19 200 lb 3.2 oz (90.8 kg)     Lab Results  Component Value Date   WBC 9.2 04/22/2019   HGB 13.5 04/22/2019   HCT 40.4 04/22/2019   PLT 309.0 04/22/2019   GLUCOSE 92 07/22/2019   CHOL 154 07/22/2019   TRIG 166.0 (H) 07/22/2019   HDL 52.50 07/22/2019   LDLCALC 69 07/22/2019   ALT 14 07/22/2019   AST 14 07/22/2019   NA 140 07/22/2019   K 4.4 07/22/2019   CL 105 07/22/2019   CREATININE 0.72 07/22/2019   BUN 14 07/22/2019   CO2 29 07/22/2019   TSH 3.25 04/22/2019   HGBA1C 6.0 03/10/2017   MICROALBUR 0.4 07/16/2012    MM 3D SCREEN BREAST UNI RIGHT  Result Date: 07/18/2019 CLINICAL DATA:  Screening. History of left mastectomy. EXAM: DIGITAL SCREENING UNILATERAL RIGHT MAMMOGRAM WITH CAD AND TOMO COMPARISON:  Previous exam(s). ACR Breast Density Category b: There are scattered areas of fibroglandular density. FINDINGS: There are no findings suspicious for malignancy. Postsurgical changes are seen in the right breast. Images were processed with CAD. IMPRESSION: No mammographic evidence of malignancy. A result letter of this screening mammogram will be mailed directly to the patient. RECOMMENDATION: Screening mammogram in one year. (Code:SM-B-01Y) BI-RADS CATEGORY  2: Benign. Electronically Signed   By: Zerita Boers M.D.   On: 07/18/2019 17:17     Assessment & Plan:  Plan  I have discontinued Norma Kelley's lisinopril. I am also having her start on metoprolol succinate. Additionally, I am having her maintain her Omeprazole, aspirin, valACYclovir, levothyroxine, and ALPRAZolam.  Meds ordered this encounter    Medications  . metoprolol succinate (TOPROL-XL) 50 MG 24 hr tablet    Sig: Take 1 tablet (50 mg total) by mouth daily. Take with or immediately following a meal.    Dispense:  30 tablet    Refill:  3    Problem List Items Addressed This Visit      Unprioritized   Essential hypertension    bp great but due to palpitations will change lisinopril to toprol xl 50 mg daily F/u 2-3 weeks or sooner prn        Relevant Medications   metoprolol succinate (TOPROL-XL) 50 MG 24 hr tablet   Palpitations - Primary    Pt has app with cardiology next month Check echo Change bp med to toprol xl  Relevant Medications   metoprolol succinate (TOPROL-XL) 50 MG 24 hr tablet   Other Relevant Orders   ECHOCARDIOGRAM COMPLETE      Follow-up: Return in about 3 weeks (around 12/19/2019), or if symptoms worsen or fail to improve, for hypertension.  Ann Held, DO

## 2019-11-28 NOTE — Assessment & Plan Note (Signed)
bp great but due to palpitations will change lisinopril to toprol xl 50 mg daily F/u 2-3 weeks or sooner prn

## 2019-11-28 NOTE — Assessment & Plan Note (Signed)
Pt has app with cardiology next month Check echo Change bp med to toprol xl

## 2019-11-30 ENCOUNTER — Encounter: Payer: Self-pay | Admitting: Family Medicine

## 2019-12-02 NOTE — Telephone Encounter (Signed)
Glad you are feeling  better

## 2019-12-06 ENCOUNTER — Other Ambulatory Visit: Payer: Self-pay

## 2019-12-06 ENCOUNTER — Ambulatory Visit (HOSPITAL_COMMUNITY)
Admission: RE | Admit: 2019-12-06 | Discharge: 2019-12-06 | Disposition: A | Discharge status: Home / Self Care | Payer: BC Managed Care – PPO | Source: Ambulatory Visit | Attending: Family Medicine | Admitting: Family Medicine

## 2019-12-06 DIAGNOSIS — R002 Palpitations: Secondary | ICD-10-CM

## 2019-12-06 DIAGNOSIS — Z87891 Personal history of nicotine dependence: Secondary | ICD-10-CM | POA: Insufficient documentation

## 2019-12-06 DIAGNOSIS — E119 Type 2 diabetes mellitus without complications: Secondary | ICD-10-CM | POA: Diagnosis not present

## 2019-12-06 LAB — ECHOCARDIOGRAM COMPLETE
Area-P 1/2: 3.68 cm2
Calc EF: 60 %
S' Lateral: 3 cm
Single Plane A2C EF: 52.6 %
Single Plane A4C EF: 66 %

## 2019-12-06 NOTE — Progress Notes (Signed)
  Echocardiogram 2D Echocardiogram has been performed.  Geoffery Lyons Swaim 12/06/2019, 9:18 AM

## 2019-12-07 ENCOUNTER — Encounter: Payer: Self-pay | Admitting: Family Medicine

## 2019-12-09 NOTE — Telephone Encounter (Signed)
im ok with sys 120s but the lower ones may be too low for her--- she can break the metoprolol in 1/2 and f/u with [place] this week

## 2019-12-12 NOTE — Progress Notes (Signed)
Referring-Yvonne Lowne Chase DO Reason for referral-palpitations  HPI: 59 year old female for evaluation of palpitations at request of Roma Schanz DO.  Calcium score December 2014 0.  Monitor March 2021 showed sinus rhythm with frequent PVCs, PACs and one short run of SVT.  Echocardiogram March 2021 showed normal LV function.  Patient states that since December she has had palpitations described as a skip.  These predominantly occur with activities and exercise.  She denies increased dyspnea, chest pain or syncope.  She was placed on Toprol with some improvement in symptoms.  Cardiology now asked to evaluate.  Current Outpatient Medications  Medication Sig Dispense Refill  . ALPRAZolam (XANAX) 0.25 MG tablet TAKE 1 TABLET(0.25 MG) BY MOUTH THREE TIMES DAILY AS NEEDED FOR ANXIETY 60 tablet 1  . aspirin 81 MG tablet Take 81 mg by mouth daily.    Marland Kitchen levothyroxine (SYNTHROID) 100 MCG tablet TAKE 1 TABLET(100 MCG) BY MOUTH DAILY 90 tablet 1  . metoprolol succinate (TOPROL-XL) 50 MG 24 hr tablet 1/2 tab po qd Take with or immediately following a meal. 30 tablet 3  . Omeprazole ([facility] OMEPRAZOLE) 20 MG TBEC Take 1 tablet by mouth daily.     . valACYclovir (VALTREX) 500 MG tablet Take 1 tablet (500 mg total) by mouth daily as needed. 30 tablet 1   No current facility-administered medications for this visit.    Allergies  Allergen Reactions  . Sulfamethoxazole Itching    Swelling around eyes  . Sulfonamide Derivatives      Past Medical History:  Diagnosis Date  . Allergy    seasonal  . Anxiety   . Arthritis    fingers  . Breast cancer ([place]) 2007   left breast cancer  . Depression   . Diet-controlled diabetes mellitus ([place])    pt. denies 11/21/18  . GERD (gastroesophageal reflux disease)   . Hypothyroidism     Past Surgical History:  Procedure Laterality Date  . ABDOMINAL HYSTERECTOMY    . BACK SURGERY  2013   lumbar  . BACK SURGERY    . 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 BREAST LUMPECTOMY WITH RADIOACTIVE SEED LOCALIZATION Right 08/17/2015   Procedure: BREAST LUMPECTOMY WITH RADIOACTIVE SEED LOCALIZATION;  Surgeon: Excell Seltzer, MD;  Location: [facility];  Service: General;  Laterality: Right;  . BREAST SURGERY     Mastectomy and reconstruction  . COLONOSCOPY    . MASTECTOMY Left 2007   mastectomy and tamoxifen  . POLYPECTOMY    . TONSILLECTOMY      Social History   Socioeconomic History  . Marital status: Married    Spouse name: Not on file  . Number of children: 3  . Years of education: Not on file  . Highest education level: Not on file  Occupational History    Employer: [employer]  Tobacco Use  . Smoking status: Former Smoker    Packs/day: 1.00    Years: 20.00    Pack years: 20.00    Types: Cigarettes  . Smokeless tobacco: Never Used  . Tobacco comment: Quit 14 years ago  Vaping Use  . Vaping Use: Never used  Substance and Sexual Activity  . Alcohol use: Yes    Comment: Occasional  . Drug use: No  . Sexual activity: Yes  Other Topics Concern  . Not on file  Social History Narrative   Lives with husband.  Occasional exercise.   Social Determinants of Health   Financial  Resource Strain:   . Difficulty of Paying Living Expenses:   Food Insecurity:   . Worried About Charity fundraiser in the Last Year:   . Arboriculturist in the Last Year:   Transportation Needs:   . Film/video editor (Medical):   Marland Kitchen Lack of Transportation (Non-Medical):   Physical Activity:   . Days of Exercise per Week:   . Minutes of Exercise per Session:   Stress:   . Feeling of Stress :   Social Connections:   . Frequency of Communication with Friends and Family:   . Frequency of Social Gatherings with Friends and Family:   . Attends Religious Services:   . Active Member of Clubs or Organizations:   . Attends Archivist Meetings:   Marland Kitchen Marital Status:   Intimate Partner Violence:   . Fear  of Current or Ex-Partner:   . Emotionally Abused:   Marland Kitchen Physically Abused:   . Sexually Abused:     Family History  Problem Relation Age of Onset  . Hyperlipidemia Mother   . Osteoporosis Mother   . Diabetes Mother   . Heart attack Mother 74  . Breast cancer Mother 26  . Heart attack Father 85       MASSIVE MI,  Died age 74  . Colon polyps Father   . Colon cancer Maternal Grandfather        dx in late 60s-early 70s  . Head & neck cancer Maternal Grandmother        cancer of the sinuses  . Pancreatic cancer Neg Hx   . Stomach cancer Neg Hx   . Rectal cancer Neg Hx   . Esophageal cancer Neg Hx     ROS: no fevers or chills, productive cough, hemoptysis, dysphasia, odynophagia, melena, hematochezia, dysuria, hematuria, rash, seizure activity, orthopnea, PND, pedal edema, claudication. Remaining systems are negative.  Physical Exam:   Blood pressure 122/80, pulse 67, height 5\' 6"  (1.676 m), weight 198 lb 1.9 oz (89.9 kg).  General:  Well developed/well nourished in NAD Skin warm/dry Patient not depressed No peripheral clubbing Back-normal HEENT-normal/normal eyelids Neck supple/normal carotid upstroke bilaterally; no bruits; no JVD; no thyromegaly chest - CTA/ normal expansion CV - RRR/normal S1 and S2; no murmurs, rubs or gallops;  PMI nondisplaced Abdomen -NT/ND, no HSM, no mass, + bowel sounds, no bruit 2+ femoral pulses, no bruits Ext-no edema, chords, 2+ DP Neuro-grossly nonfocal  ECG -sinus rhythm at a rate of 67, no ST changes.  Personally reviewed  A/P  1 palpitations-previous monitor showed occasional PACs and PVCs.  Her symptoms have improved with Toprol but still persist at times.  Her LV function is normal.  Recent TSH normal.  I have asked her to take an additional 25 mg of Toprol daily as needed for worsening symptoms.  Her symptoms predominantly occur with activities.  We will arrange an exercise treadmill to further assess.  2 family history of coronary  artery disease-we will arrange a calcium score for risk stratification.  3 hypertension-patient's blood pressure is controlled.  Continue present medications.  Kirk Ruths, MD

## 2019-12-16 ENCOUNTER — Other Ambulatory Visit: Payer: Self-pay | Admitting: Family Medicine

## 2019-12-16 DIAGNOSIS — F419 Anxiety disorder, unspecified: Secondary | ICD-10-CM

## 2019-12-18 NOTE — Telephone Encounter (Signed)
Last ov- 11-28-2019 Last refill- 11-12-2019    60 tabs 1 tab tid prn Next appointment- 12-20-2019 UDS- 01-21-2019

## 2019-12-20 ENCOUNTER — Ambulatory Visit: Payer: BC Managed Care – PPO | Admitting: Family Medicine

## 2019-12-20 ENCOUNTER — Other Ambulatory Visit: Payer: Self-pay

## 2019-12-20 ENCOUNTER — Encounter: Payer: Self-pay | Admitting: Family Medicine

## 2019-12-20 VITALS — BP 114/74 | HR 70 | Temp 98.1°F | Resp 18 | Ht 66.0 in | Wt 198.0 lb

## 2019-12-20 DIAGNOSIS — I10 Essential (primary) hypertension: Secondary | ICD-10-CM

## 2019-12-20 DIAGNOSIS — R002 Palpitations: Secondary | ICD-10-CM | POA: Diagnosis not present

## 2019-12-20 DIAGNOSIS — E039 Hypothyroidism, unspecified: Secondary | ICD-10-CM | POA: Diagnosis not present

## 2019-12-20 LAB — LIPID PANEL
Cholesterol: 146 mg/dL (ref <200)
HDL: 47 mg/dL — ABNORMAL LOW (ref >=50)
LDL Cholesterol (Calc): 77 mg/dL (calc)
Non-HDL Cholesterol (Calc): 99 mg/dL (calc) (ref <130)
Total CHOL/HDL Ratio: 3.1 (calc) (ref <5.0)
Triglycerides: 137 mg/dL (ref <150)

## 2019-12-20 LAB — COMPREHENSIVE METABOLIC PANEL
AG Ratio: 1.9 (calc) (ref 1.0–2.5)
ALT: 12 U/L (ref 6–29)
AST: 13 U/L (ref 10–35)
Albumin: 4.5 g/dL (ref 3.6–5.1)
Alkaline phosphatase (APISO): 87 U/L (ref 37–153)
BUN: 13 mg/dL (ref 7–25)
CO2: 27 mmol/L (ref 20–32)
Calcium: 9.8 mg/dL (ref 8.6–10.4)
Chloride: 105 mmol/L (ref 98–110)
Creat: 0.78 mg/dL (ref 0.50–0.99)
Globulin: 2.4 g/dL (calc) (ref 1.9–3.7)
Glucose, Bld: 107 mg/dL — ABNORMAL HIGH (ref 65–99)
Potassium: 4.2 mmol/L (ref 3.5–5.3)
Sodium: 141 mmol/L (ref 135–146)
Total Bilirubin: 0.7 mg/dL (ref 0.2–1.2)
Total Protein: 6.9 g/dL (ref 6.1–8.1)

## 2019-12-20 LAB — TSH: TSH: 0.88 mIU/L (ref 0.40–4.50)

## 2019-12-20 MED ORDER — METOPROLOL SUCCINATE ER 50 MG PO TB24: ORAL_TABLET | ORAL | 3 refills | Status: DC

## 2019-12-20 NOTE — Assessment & Plan Note (Signed)
Well controlled, no changes to meds. Encouraged heart healthy diet such as the DASH diet and exercise as tolerated.  °

## 2019-12-20 NOTE — Progress Notes (Signed)
Patient ID: Norma Kelley, female    DOB: 11/04/1960  Age: 59 y.o. MRN: [id number]    Subjective:  Subjective  HPI [name] presents for f/u palpitations ----  1/2 metoprolol is helping but she is still having palp that bother her.   No cp, no sob   Review of Systems  Constitutional: Negative for appetite change, diaphoresis, fatigue and unexpected weight change.  Eyes: Negative for pain, redness and visual disturbance.  Respiratory: Negative for cough, chest tightness, shortness of breath and wheezing.   Cardiovascular: Positive for palpitations. Negative for chest pain and leg swelling.  Endocrine: Negative for cold intolerance, heat intolerance, polydipsia, polyphagia and polyuria.  Genitourinary: Negative for difficulty urinating, dysuria and frequency.  Neurological: Negative for dizziness, light-headedness, numbness and headaches.    History Past Medical History:  Diagnosis Date  . Allergy    seasonal  . Anxiety   . Arthritis    fingers  . Breast cancer ([place]) 2007   left breast cancer  . Depression   . Diet-controlled diabetes mellitus ([place])    pt. denies 11/21/18  . GERD (gastroesophageal reflux disease)   . Hypothyroidism   . Thyroid disease     She has a past surgical history that includes Abdominal hysterectomy; Tonsillectomy; Breast surgery; Mastectomy (Left, 2007); Back surgery (2013); Breast lumpectomy with radioactive seed localization (Right, 08/17/2015); Breast excisional biopsy (Right, 08/17/2015); Colonoscopy; Polypectomy; and Back surgery.   Her family history includes Breast cancer (age of onset: 74) in her mother; Colon cancer in her maternal grandfather; Colon polyps in her father; Diabetes in her mother; Head & neck cancer in her maternal grandmother; Heart attack (age of onset: 76) in her father; Heart attack (age of onset: 67) in her mother; Hyperlipidemia in her mother; Osteoporosis in her mother.She reports that she has quit smoking. Her smoking  use included cigarettes. She has a 20.00 pack-year smoking history. She has never used smokeless tobacco. She reports that she does not drink alcohol and does not use drugs.  Current Outpatient Medications on File Prior to Visit  Medication Sig Dispense Refill  . ALPRAZolam (XANAX) 0.25 MG tablet TAKE 1 TABLET(0.25 MG) BY MOUTH THREE TIMES DAILY AS NEEDED FOR ANXIETY 60 tablet 1  . aspirin 81 MG tablet Take 81 mg by mouth daily.    Marland Kitchen levothyroxine (SYNTHROID) 100 MCG tablet TAKE 1 TABLET(100 MCG) BY MOUTH DAILY 90 tablet 1  . Omeprazole ([facility] OMEPRAZOLE) 20 MG TBEC Take 1 tablet by mouth daily.     . valACYclovir (VALTREX) 500 MG tablet Take 1 tablet (500 mg total) by mouth daily as needed. 30 tablet 1   No current facility-administered medications on file prior to visit.     Objective:  Objective  Physical Exam Nursing note reviewed. Exam conducted with a chaperone present.  Constitutional:      Appearance: She is well-developed.  HENT:     Head: Normocephalic and atraumatic.  Eyes:     Conjunctiva/sclera: Conjunctivae normal.  Neck:     Thyroid: No thyromegaly.     Vascular: No carotid bruit or JVD.  Cardiovascular:     Rate and Rhythm: Normal rate and regular rhythm.     Heart sounds: Normal heart sounds. No murmur heard.   Pulmonary:     Effort: Pulmonary effort is normal. No respiratory distress.     Breath sounds: Normal breath sounds. No wheezing or rales.  Chest:     Chest wall: No tenderness.  Musculoskeletal:  Cervical back: Normal range of motion and neck supple.  Neurological:     Mental Status: She is alert and oriented to person, place, and time.    BP 114/74 (BP Location: Right Arm, Patient Position: Sitting, Cuff Size: Normal)   Pulse 70   Temp 98.1 F (36.7 C) (Oral)   Resp 18   Ht 5\' 6"  (1.676 m)   Wt 198 lb (89.8 kg)   SpO2 98%   BMI 31.96 kg/m  Wt Readings from Last 3 Encounters:  12/20/19 198 lb (89.8 kg)  11/28/19 196 lb 12.8 oz (89.3 kg)    07/22/19 203 lb 3.2 oz (92.2 kg)     Lab Results  Component Value Date   WBC 9.2 04/22/2019   HGB 13.5 04/22/2019   HCT 40.4 04/22/2019   PLT 309.0 04/22/2019   GLUCOSE 92 07/22/2019   CHOL 154 07/22/2019   TRIG 166.0 (H) 07/22/2019   HDL 52.50 07/22/2019   LDLCALC 69 07/22/2019   ALT 14 07/22/2019   AST 14 07/22/2019   NA 140 07/22/2019   K 4.4 07/22/2019   CL 105 07/22/2019   CREATININE 0.72 07/22/2019   BUN 14 07/22/2019   CO2 29 07/22/2019   TSH 3.25 04/22/2019   HGBA1C 6.0 03/10/2017   MICROALBUR 0.4 07/16/2012    ECHOCARDIOGRAM COMPLETE  Result Date: 12/06/2019    ECHOCARDIOGRAM REPORT   Patient Name:   Norma Kelley Date of Exam: 12/06/2019 Medical Rec #:  [id number]      Height:       66.0 in Accession #:    [id number]     Weight:       196.8 lb Date of Birth:  1960/06/11      BSA:          1.986 m Patient Age:    32 years       BP:           110/70 mmHg Patient Gender: F              HR:           59 bpm. Exam Location:  Outpatient Procedure: 2D Echo, Cardiac Doppler, Color Doppler and Strain Analysis Indications:    Palpitations 785.1 / R00.2  History:        Patient has no prior history of Echocardiogram examinations.                 Risk Factors:Diabetes and Former Smoker. Palpitations. GERD.                 Breast cancer.  Sonographer:    Vickie Epley RDCS Referring Phys: [place]  1. Left ventricular ejection fraction, by estimation, is 60 to 65%. The left ventricle has normal function. The left ventricle has no regional wall motion abnormalities. Left ventricular diastolic parameters were normal. The average left ventricular global longitudinal strain is -22.2 %.  2. Right ventricular systolic function is normal. The right ventricular size is normal. There is normal pulmonary artery systolic pressure. The estimated right ventricular systolic pressure is 61.6 mmHg.  3. The mitral valve is normal in structure. No evidence of mitral valve  regurgitation.  4. The aortic valve is tricuspid. Aortic valve regurgitation is not visualized. No aortic stenosis is present.  5. The inferior vena cava is normal in size with greater than 50% respiratory variability, suggesting right atrial pressure of 3 mmHg. FINDINGS  Left Ventricle: Left ventricular ejection fraction, by  estimation, is 60 to 65%. The left ventricle has normal function. The left ventricle has no regional wall motion abnormalities. The average left ventricular global longitudinal strain is -22.2 %. The left ventricular internal cavity size was normal in size. There is no left ventricular hypertrophy. Left ventricular diastolic parameters were normal. Right Ventricle: The right ventricular size is normal. No increase in right ventricular wall thickness. Right ventricular systolic function is normal. There is normal pulmonary artery systolic pressure. The tricuspid regurgitant velocity is 2.00 m/s, and  with an assumed right atrial pressure of 3 mmHg, the estimated right ventricular systolic pressure is 06.0 mmHg. Left Atrium: Left atrial size was normal in size. Right Atrium: Right atrial size was normal in size. Pericardium: There is no evidence of pericardial effusion. Mitral Valve: The mitral valve is normal in structure. No evidence of mitral valve regurgitation. Tricuspid Valve: The tricuspid valve is normal in structure. Tricuspid valve regurgitation is trivial. Aortic Valve: The aortic valve is tricuspid. Aortic valve regurgitation is not visualized. No aortic stenosis is present. Pulmonic Valve: The pulmonic valve was not well visualized. Pulmonic valve regurgitation is not visualized. Aorta: The aortic root is normal in size and structure. Venous: The inferior vena cava is normal in size with greater than 50% respiratory variability, suggesting right atrial pressure of 3 mmHg. IAS/Shunts: The interatrial septum was not well visualized.  LEFT VENTRICLE PLAX 2D LVIDd:         4.70 cm      Diastology LVIDs:         3.00 cm     LV e' lateral:   9.79 cm/s LV PW:         0.70 cm     LV E/e' lateral: 8.1 LV IVS:        0.70 cm     LV e' medial:    9.03 cm/s LVOT diam:     1.80 cm     LV E/e' medial:  8.8 LV SV:         58 LV SV Index:   29          2D Longitudinal Strain LVOT Area:     2.54 cm    2D Strain GLS Avg:     -22.2 %  LV Volumes (MOD) LV vol d, MOD A2C: 79.9 ml LV vol d, MOD A4C: 95.8 ml LV vol s, MOD A2C: 37.9 ml LV vol s, MOD A4C: 32.6 ml LV SV MOD A2C:     42.0 ml LV SV MOD A4C:     95.8 ml LV SV MOD BP:      53.0 ml RIGHT VENTRICLE RV S prime:     13.40 cm/s TAPSE (M-mode): 1.8 cm LEFT ATRIUM             Index       RIGHT ATRIUM          Index LA diam:        3.60 cm 1.81 cm/m  RA Area:     8.73 cm LA Vol (A2C):   29.8 ml 15.00 ml/m RA Volume:   14.60 ml 7.35 ml/m LA Vol (A4C):   33.5 ml 16.87 ml/m LA Biplane Vol: 33.3 ml 16.77 ml/m  AORTIC VALVE LVOT Vmax:   100.00 cm/s LVOT Vmean:  65.600 cm/s LVOT VTI:    0.226 m MITRAL VALVE               TRICUSPID VALVE MV Area (PHT): 3.68 cm  TR Peak grad:   16.0 mmHg MV Decel Time: 206 msec    TR Vmax:        200.00 cm/s MV E velocity: 79.50 cm/s MV A velocity: 77.10 cm/s  SHUNTS MV E/A ratio:  1.03        Systemic VTI:  0.23 m                            Systemic Diam: 1.80 cm Oswaldo Milian MD Electronically signed by Oswaldo Milian MD Signature Date/Time: 12/06/2019/12:40:05 PM    Final      Assessment & Plan:  Plan  I am having Achsah A. Hornsby maintain her Omeprazole, aspirin, valACYclovir, levothyroxine, ALPRAZolam, and metoprolol succinate.  Meds ordered this encounter  Medications  . DISCONTD: metoprolol succinate (TOPROL-XL) 50 MG 24 hr tablet    Sig: 1/2 tab po qd Take with or immediately following a meal.    Dispense:  30 tablet    Refill:  3  . metoprolol succinate (TOPROL-XL) 50 MG 24 hr tablet    Sig: 1/2 tab po qd Take with or immediately following a meal.    Dispense:  30 tablet    Refill:  3     Problem List Items Addressed This Visit      Unprioritized   Essential hypertension   Relevant Medications   metoprolol succinate (TOPROL-XL) 50 MG 24 hr tablet   Other Relevant Orders   Comprehensive metabolic panel   Lipid panel   Hypothyroidism - Primary   Relevant Medications   metoprolol succinate (TOPROL-XL) 50 MG 24 hr tablet   Other Relevant Orders   TSH   Palpitations   Relevant Medications   metoprolol succinate (TOPROL-XL) 50 MG 24 hr tablet   Other Relevant Orders   Comprehensive metabolic panel   Lipid panel      Follow-up: Return in about 3 months (around 03/21/2020), or if symptoms worsen or fail to improve, for hypertension.  Ann Held, DO

## 2019-12-20 NOTE — Assessment & Plan Note (Signed)
Check labs con't meds 

## 2019-12-20 NOTE — Patient Instructions (Signed)

## 2019-12-20 NOTE — Assessment & Plan Note (Signed)
Metoprolol is helping--- unable to tolerate higher dose F/u cardiology

## 2019-12-25 ENCOUNTER — Ambulatory Visit: Payer: BC Managed Care – PPO | Admitting: Cardiology

## 2019-12-25 ENCOUNTER — Other Ambulatory Visit: Payer: Self-pay

## 2019-12-25 ENCOUNTER — Encounter: Payer: Self-pay | Admitting: Cardiology

## 2019-12-25 ENCOUNTER — Encounter: Payer: Self-pay | Admitting: *Deleted

## 2019-12-25 VITALS — BP 122/80 | HR 67 | Ht 66.0 in | Wt 198.1 lb

## 2019-12-25 DIAGNOSIS — I10 Essential (primary) hypertension: Secondary | ICD-10-CM

## 2019-12-25 DIAGNOSIS — Z8249 Family history of ischemic heart disease and other diseases of the circulatory system: Secondary | ICD-10-CM | POA: Diagnosis not present

## 2019-12-25 DIAGNOSIS — R002 Palpitations: Secondary | ICD-10-CM

## 2019-12-25 NOTE — Patient Instructions (Signed)
Medication Instructions:    NO CHANGE  *If you need a refill on your cardiac medications before your next appointment, please call your pharmacy*   Lab Work: If you have labs (blood work) drawn today and your tests are completely normal, you will receive your results only by: Marland Kitchen MyChart Message (if you have MyChart) OR . A paper copy in the mail If you have any lab test that is abnormal or we need to change your treatment, we will call you to review the results.   Testing/Procedures:  CORONARY CALCIUM SCORE CT AT [place]  Your physician has requested that you have an exercise tolerance test. For further information please visit [email or web address]. Please also follow instruction sheet, as given.[place], [address] -[place]   Follow-Up: At [facility], you and your health needs are our priority.  As part of our continuing mission to provide you with exceptional heart care, we have created designated Provider Care Teams.  These Care Teams include your primary Cardiologist (physician) and Advanced Practice Providers (APPs -  Physician Assistants and Nurse Practitioners) who all work together to provide you with the care you need, when you need it.  We recommend signing up for the patient portal called "MyChart".  Sign up information is provided on this After Visit Summary.  MyChart is used to connect with patients for Virtual Visits (Telemedicine).  Patients are able to view lab/test results, encounter notes, upcoming appointments, etc.  Non-urgent messages can be sent to your provider as well.   To learn more about what you can do with MyChart, go to [email or web address].    Your next appointment:   6 month(s)  The format for your next appointment:   In Person  Provider:   Kirk Ruths, MD

## 2020-01-14 ENCOUNTER — Other Ambulatory Visit (HOSPITAL_COMMUNITY)
Admission: RE | Admit: 2020-01-14 | Discharge: 2020-01-14 | Disposition: A | Discharge status: Home / Self Care | Payer: BC Managed Care – PPO | Source: Ambulatory Visit | Attending: Cardiology | Admitting: Cardiology

## 2020-01-14 DIAGNOSIS — Z01812 Encounter for preprocedural laboratory examination: Secondary | ICD-10-CM | POA: Insufficient documentation

## 2020-01-14 DIAGNOSIS — Z20822 Contact with and (suspected) exposure to covid-19: Secondary | ICD-10-CM | POA: Insufficient documentation

## 2020-01-15 ENCOUNTER — Telehealth (HOSPITAL_COMMUNITY): Payer: Self-pay

## 2020-01-15 LAB — SARS CORONAVIRUS 2 (TAT 6-24 HRS): SARS Coronavirus 2: NEGATIVE

## 2020-01-15 NOTE — Telephone Encounter (Signed)
Encounter complete. 

## 2020-01-17 ENCOUNTER — Ambulatory Visit (INDEPENDENT_AMBULATORY_CARE_PROVIDER_SITE_OTHER)
Admission: RE | Admit: 2020-01-17 | Discharge: 2020-01-17 | Disposition: A | Discharge status: Home / Self Care | Payer: Self-pay | Source: Ambulatory Visit | Attending: Cardiology | Admitting: Cardiology

## 2020-01-17 ENCOUNTER — Other Ambulatory Visit: Payer: Self-pay

## 2020-01-17 ENCOUNTER — Ambulatory Visit (HOSPITAL_COMMUNITY)
Admission: RE | Admit: 2020-01-17 | Discharge: 2020-01-17 | Disposition: A | Discharge status: Home / Self Care | Payer: BC Managed Care – PPO | Source: Ambulatory Visit | Attending: Cardiology | Admitting: Cardiology

## 2020-01-17 DIAGNOSIS — R002 Palpitations: Secondary | ICD-10-CM

## 2020-01-17 DIAGNOSIS — Z8249 Family history of ischemic heart disease and other diseases of the circulatory system: Secondary | ICD-10-CM

## 2020-01-17 LAB — EXERCISE TOLERANCE TEST
Estimated workload: 7 METS
Exercise duration (min): 5 min
Exercise duration (sec): 17 s
MPHR: 159 {beats}/min
Peak HR: 155 {beats}/min
Percent HR: 97 %
Rest HR: 76 {beats}/min

## 2020-01-23 ENCOUNTER — Ambulatory Visit: Payer: BC Managed Care – PPO | Admitting: Family Medicine

## 2020-03-18 ENCOUNTER — Other Ambulatory Visit: Payer: Self-pay | Admitting: Family Medicine

## 2020-03-18 DIAGNOSIS — F419 Anxiety disorder, unspecified: Secondary | ICD-10-CM

## 2020-03-18 DIAGNOSIS — F32A Depression, unspecified: Secondary | ICD-10-CM

## 2020-03-18 NOTE — Telephone Encounter (Signed)
Requesting: Xanax Contract: 01/21/19 UDS: 01/21/19, low risk  Last OV: 12/20/19 Next OV: 03/20/20 Last Refill: 12/18/19, #60--1 RF Database:   Please advise

## 2020-03-20 ENCOUNTER — Other Ambulatory Visit: Payer: Self-pay

## 2020-03-20 ENCOUNTER — Encounter: Payer: Self-pay | Admitting: Family Medicine

## 2020-03-20 ENCOUNTER — Ambulatory Visit: Payer: BC Managed Care – PPO | Admitting: Family Medicine

## 2020-03-20 DIAGNOSIS — E039 Hypothyroidism, unspecified: Secondary | ICD-10-CM | POA: Diagnosis not present

## 2020-03-20 DIAGNOSIS — R002 Palpitations: Secondary | ICD-10-CM | POA: Diagnosis not present

## 2020-03-20 DIAGNOSIS — I10 Essential (primary) hypertension: Secondary | ICD-10-CM

## 2020-03-20 DIAGNOSIS — B349 Viral infection, unspecified: Secondary | ICD-10-CM

## 2020-03-20 MED ORDER — METOPROLOL SUCCINATE ER 50 MG PO TB24: ORAL_TABLET | ORAL | 3 refills | Status: DC

## 2020-03-20 MED ORDER — LEVOTHYROXINE SODIUM 100 MCG PO TABS: ORAL_TABLET | ORAL | 3 refills | Status: DC

## 2020-03-20 NOTE — Progress Notes (Signed)
Patient ID: Norma Kelley, female    DOB: March 16, 1961  Age: 59 y.o. MRN: [id number]    Subjective:  Subjective  HPI Norma Kelley presents for f/u bp and palpitations --- palp have decreased   No other complaints   Review of Systems  Constitutional: Negative for appetite change, diaphoresis, fatigue and unexpected weight change.  Eyes: Negative for pain, redness and visual disturbance.  Respiratory: Negative for cough, chest tightness, shortness of breath and wheezing.   Cardiovascular: Negative for chest pain, palpitations and leg swelling.  Endocrine: Negative for cold intolerance, heat intolerance, polydipsia, polyphagia and polyuria.  Genitourinary: Negative for difficulty urinating, dysuria and frequency.  Neurological: Negative for dizziness, light-headedness, numbness and headaches.    History Past Medical History:  Diagnosis Date  . Allergy    seasonal  . Anxiety   . Arthritis    fingers  . Breast cancer ([place]) 2007   left breast cancer  . Depression   . Diet-controlled diabetes mellitus ([place])    pt. denies 11/21/18  . GERD (gastroesophageal reflux disease)   . Hypothyroidism     She has a past surgical history that includes Abdominal hysterectomy; Tonsillectomy; Breast surgery; Mastectomy (Left, 2007); Back surgery (2013); Breast lumpectomy with radioactive seed localization (Right, 08/17/2015); Breast excisional biopsy (Right, 08/17/2015); Colonoscopy; Polypectomy; and Back surgery.   Her family history includes Breast cancer (age of onset: 34) in her mother; Colon cancer in her maternal grandfather; Colon polyps in her father; Diabetes in her mother; Head & neck cancer in her maternal grandmother; Heart attack (age of onset: 9) in her father; Heart attack (age of onset: 21) in her mother; Hyperlipidemia in her mother; Osteoporosis in her mother.She reports that she has quit smoking. Her smoking use included cigarettes. She has a 20.00 pack-year smoking history. She has  never used smokeless tobacco. She reports current alcohol use. She reports that she does not use drugs.  Current Outpatient Medications on File Prior to Visit  Medication Sig Dispense Refill  . ALPRAZolam (XANAX) 0.25 MG tablet TAKE 1 TABLET(0.25 MG) BY MOUTH THREE TIMES DAILY AS NEEDED FOR ANXIETY 60 tablet 1  . Omeprazole ([employer] OMEPRAZOLE) 20 MG TBEC Take 1 tablet by mouth daily.     . valACYclovir (VALTREX) 500 MG tablet Take 1 tablet (500 mg total) by mouth daily as needed. 30 tablet 1   No current facility-administered medications on file prior to visit.     Objective:  Objective  Physical Exam Vitals and nursing note reviewed.  Constitutional:      Appearance: She is well-developed.  HENT:     Head: Normocephalic and atraumatic.  Eyes:     Conjunctiva/sclera: Conjunctivae normal.  Neck:     Thyroid: No thyromegaly.     Vascular: No carotid bruit or JVD.  Cardiovascular:     Rate and Rhythm: Normal rate and regular rhythm.     Heart sounds: Normal heart sounds. No murmur heard.   Pulmonary:     Effort: Pulmonary effort is normal. No respiratory distress.     Breath sounds: Normal breath sounds. No wheezing or rales.  Chest:     Chest wall: No tenderness.  Musculoskeletal:     Cervical back: Normal range of motion and neck supple.  Neurological:     Mental Status: She is alert and oriented to person, place, and time.    BP 110/80 (BP Location: Right Arm, Patient Position: Sitting, Cuff Size: Normal)   Pulse 62   Temp 99  F (37.2 C) (Oral)   Resp 18   Ht 5\' 6"  (1.676 m)   Wt 201 lb 9.6 oz (91.4 kg)   SpO2 97%   BMI 32.54 kg/m  Wt Readings from Last 3 Encounters:  03/20/20 201 lb 9.6 oz (91.4 kg)  12/25/19 198 lb 1.9 oz (89.9 kg)  12/20/19 198 lb (89.8 kg)     Lab Results  Component Value Date   WBC 9.2 04/22/2019   HGB 13.5 04/22/2019   HCT 40.4 04/22/2019   PLT 309.0 04/22/2019   GLUCOSE 107 (H) 12/20/2019   CHOL 146 12/20/2019   TRIG 137 12/20/2019     HDL 47 (L) 12/20/2019   LDLCALC 77 12/20/2019   ALT 12 12/20/2019   AST 13 12/20/2019   NA 141 12/20/2019   K 4.2 12/20/2019   CL 105 12/20/2019   CREATININE 0.78 12/20/2019   BUN 13 12/20/2019   CO2 27 12/20/2019   TSH 0.88 12/20/2019   HGBA1C 6.0 03/10/2017   MICROALBUR 0.4 07/16/2012    CT CARDIAC SCORING  Addendum Date: 01/17/2020   ADDENDUM REPORT: 01/17/2020 10:57 CLINICAL DATA:  Risk stratification EXAM: Coronary Calcium Score TECHNIQUE: The patient was scanned on a Siemens Somatom 64 slice scanner. Axial non-contrast 3 mm slices were carried out through the heart. The data set was analyzed on a dedicated work station and scored using the [place]. FINDINGS: Non-cardiac: See separate report from [facility]. Ascending aorta: Normal size Pericardium: Normal Coronary arteries: Normal origin IMPRESSION: Coronary calcium score of 0. This was 0 percentile for age and sex matched control. Kirk Ruths Electronically Signed   By: Kirk Ruths M.D.   On: 01/17/2020 10:57   Result Date: 01/17/2020 EXAM: OVER-READ INTERPRETATION  CT CHEST The following report is an over-read performed by radiologist Dr. Vinnie Langton of [facility], [place] on 01/17/2020. This over-read does not include interpretation of cardiac or coronary anatomy or pathology. The coronary calcium score interpretation by the cardiologist is attached. COMPARISON:  None. FINDINGS: Aortic atherosclerosis. Within the visualized portions of the thorax there are no suspicious appearing pulmonary nodules or masses, there is no acute consolidative airspace disease, no pleural effusions, no pneumothorax and no lymphadenopathy. Visualized portions of the upper abdomen are unremarkable. There are no aggressive appearing lytic or blastic lesions noted in the visualized portions of the skeleton. IMPRESSION: 1.  Aortic Atherosclerosis (ICD10-I70.0). Electronically Signed: By: Vinnie Langton M.D. On: 01/17/2020 09:05    Exercise Tolerance Test  Result Date: 01/17/2020  There was no ST segment deviation noted during stress.  Rare PACs during stress. No adverse arrhythmias detected. Normal rise and fall of heart rate.  Overall low risk study with no electrocardiographic evidence of ischemia.  Candee Furbish, MD    Assessment & Plan:  Plan  I have discontinued Sena A. Soutar's aspirin. I have also changed her metoprolol succinate. Additionally, I am having her maintain her Omeprazole, valACYclovir, ALPRAZolam, and levothyroxine.  Meds ordered this encounter  Medications  . metoprolol succinate (TOPROL-XL) 50 MG 24 hr tablet    Sig: 1 tab po qd Take with or immediately following a meal.    Dispense:  90 tablet    Refill:  3  . levothyroxine (SYNTHROID) 100 MCG tablet    Sig: TAKE 1 TABLET(100 MCG) BY MOUTH DAILY    Dispense:  90 tablet    Refill:  3    Problem List Items Addressed This Visit      Unprioritized   Essential  hypertension   Relevant Medications   metoprolol succinate (TOPROL-XL) 50 MG 24 hr tablet   Hypothyroidism   Relevant Medications   metoprolol succinate (TOPROL-XL) 50 MG 24 hr tablet   levothyroxine (SYNTHROID) 100 MCG tablet   Palpitations    Improved con't metoprolol F/u cardiology prn       Relevant Medications   metoprolol succinate (TOPROL-XL) 50 MG 24 hr tablet   Viral syndrome    Well controlled, no changes to meds. Encouraged heart healthy diet such as the DASH diet and exercise as tolerated.           Follow-up: Return in about 6 months (around 09/17/2020), or if symptoms worsen or fail to improve, for annual exam, fasting.  Ann Held, DO

## 2020-03-20 NOTE — Patient Instructions (Signed)
DASH Eating Plan DASH stands for "Dietary Approaches to Stop Hypertension." The DASH eating plan is a healthy eating plan that has been shown to reduce high blood pressure (hypertension). It may also reduce your risk for type 2 diabetes, heart disease, and stroke. The DASH eating plan may also help with weight loss. What are tips for following this plan?  General guidelines  Avoid eating more than 2,300 mg (milligrams) of salt (sodium) a day. If you have hypertension, you may need to reduce your sodium intake to 1,500 mg a day.  Limit alcohol intake to no more than 1 drink a day for nonpregnant women and 2 drinks a day for men. One drink equals 12 oz of beer, 5 oz of wine, or 1 oz of hard liquor.  Work with your health care provider to maintain a healthy body weight or to lose weight. Ask what an ideal weight is for you.  Get at least 30 minutes of exercise that causes your heart to beat faster (aerobic exercise) most days of the week. Activities may include walking, swimming, or biking.  Work with your health care provider or diet and nutrition specialist (dietitian) to adjust your eating plan to your individual calorie needs. Reading food labels   Check food labels for the amount of sodium per serving. Choose foods with less than 5 percent of the Daily Value of sodium. Generally, foods with less than 300 mg of sodium per serving fit into this eating plan.  To find whole grains, look for the word "whole" as the first word in the ingredient list. Shopping  Buy products labeled as "low-sodium" or "no salt added."  Buy fresh foods. Avoid canned foods and premade or frozen meals. Cooking  Avoid adding salt when cooking. Use salt-free seasonings or herbs instead of table salt or sea salt. Check with your health care provider or pharmacist before using salt substitutes.  Do not fry foods. Cook foods using healthy methods such as baking, boiling, grilling, and broiling instead.  Cook with  heart-healthy oils, such as olive, canola, soybean, or sunflower oil. Meal planning  Eat a balanced diet that includes: ? 5 or more servings of fruits and vegetables each day. At each meal, try to fill half of your plate with fruits and vegetables. ? Up to 6-8 servings of whole grains each day. ? Less than 6 oz of lean meat, poultry, or fish each day. A 3-oz serving of meat is about the same size as a deck of cards. One egg equals 1 oz. ? 2 servings of low-fat dairy each day. ? A serving of nuts, seeds, or beans 5 times each week. ? Heart-healthy fats. Healthy fats called Omega-3 fatty acids are found in foods such as flaxseeds and coldwater fish, like sardines, salmon, and mackerel.  Limit how much you eat of the following: ? Canned or prepackaged foods. ? Food that is high in trans fat, such as fried foods. ? Food that is high in saturated fat, such as fatty meat. ? Sweets, desserts, sugary drinks, and other foods with added sugar. ? Full-fat dairy products.  Do not salt foods before eating.  Try to eat at least 2 vegetarian meals each week.  Eat more home-cooked food and less restaurant, buffet, and fast food.  When eating at a restaurant, ask that your food be prepared with less salt or no salt, if possible. What foods are recommended? The items listed may not be a complete list. Talk with your dietitian about   what dietary choices are best for you. Grains Whole-grain or whole-wheat bread. Whole-grain or whole-wheat pasta. Brown rice. Oatmeal. Quinoa. Bulgur. Whole-grain and low-sodium cereals. Pita bread. Low-fat, low-sodium crackers. Whole-wheat flour tortillas. Vegetables Fresh or frozen vegetables (raw, steamed, roasted, or grilled). Low-sodium or reduced-sodium tomato and vegetable juice. Low-sodium or reduced-sodium tomato sauce and tomato paste. Low-sodium or reduced-sodium canned vegetables. Fruits All fresh, dried, or frozen fruit. Canned fruit in natural juice (without  added sugar). Meat and other protein foods Skinless chicken or turkey. Ground chicken or turkey. Pork with fat trimmed off. Fish and seafood. Egg whites. Dried beans, peas, or lentils. Unsalted nuts, nut butters, and seeds. Unsalted canned beans. Lean cuts of beef with fat trimmed off. Low-sodium, lean deli meat. Dairy Low-fat (1%) or fat-free (skim) milk. Fat-free, low-fat, or reduced-fat cheeses. Nonfat, low-sodium ricotta or cottage cheese. Low-fat or nonfat yogurt. Low-fat, low-sodium cheese. Fats and oils Soft margarine without trans fats. Vegetable oil. Low-fat, reduced-fat, or light mayonnaise and salad dressings (reduced-sodium). Canola, safflower, olive, soybean, and sunflower oils. Avocado. Seasoning and other foods Herbs. Spices. Seasoning mixes without salt. Unsalted popcorn and pretzels. Fat-free sweets. What foods are not recommended? The items listed may not be a complete list. Talk with your dietitian about what dietary choices are best for you. Grains Baked goods made with fat, such as croissants, muffins, or some breads. Dry pasta or rice meal packs. Vegetables Creamed or fried vegetables. Vegetables in a cheese sauce. Regular canned vegetables (not low-sodium or reduced-sodium). Regular canned tomato sauce and paste (not low-sodium or reduced-sodium). Regular tomato and vegetable juice (not low-sodium or reduced-sodium). Pickles. Olives. Fruits Canned fruit in a light or heavy syrup. Fried fruit. Fruit in cream or butter sauce. Meat and other protein foods Fatty cuts of meat. Ribs. Fried meat. Bacon. Sausage. Bologna and other processed lunch meats. Salami. Fatback. Hotdogs. Bratwurst. Salted nuts and seeds. Canned beans with added salt. Canned or smoked fish. Whole eggs or egg yolks. Chicken or turkey with skin. Dairy Whole or 2% milk, cream, and half-and-half. Whole or full-fat cream cheese. Whole-fat or sweetened yogurt. Full-fat cheese. Nondairy creamers. Whipped toppings.  Processed cheese and cheese spreads. Fats and oils Butter. Stick margarine. Lard. Shortening. Ghee. Bacon fat. Tropical oils, such as coconut, palm kernel, or palm oil. Seasoning and other foods Salted popcorn and pretzels. Onion salt, garlic salt, seasoned salt, table salt, and sea salt. Worcestershire sauce. Tartar sauce. Barbecue sauce. Teriyaki sauce. Soy sauce, including reduced-sodium. Steak sauce. Canned and packaged gravies. Fish sauce. Oyster sauce. Cocktail sauce. Horseradish that you find on the shelf. Ketchup. Mustard. Meat flavorings and tenderizers. Bouillon cubes. Hot sauce and Tabasco sauce. Premade or packaged marinades. Premade or packaged taco seasonings. Relishes. Regular salad dressings. Where to find more information:  National Heart, Lung, and Blood Institute: www.nhlbi.nih.gov  American Heart Association: www.heart.org Summary  The DASH eating plan is a healthy eating plan that has been shown to reduce high blood pressure (hypertension). It may also reduce your risk for type 2 diabetes, heart disease, and stroke.  With the DASH eating plan, you should limit salt (sodium) intake to 2,300 mg a day. If you have hypertension, you may need to reduce your sodium intake to 1,500 mg a day.  When on the DASH eating plan, aim to eat more fresh fruits and vegetables, whole grains, lean proteins, low-fat dairy, and heart-healthy fats.  Work with your health care provider or diet and nutrition specialist (dietitian) to adjust your eating plan to your   individual calorie needs. This information is not intended to replace advice given to you by your health care provider. Make sure you discuss any questions you have with your health care provider. Document Revised: 04/07/2017 Document Reviewed: 04/18/2016 Elsevier Patient Education  2020 [employer] Inc.  

## 2020-03-21 NOTE — Assessment & Plan Note (Signed)
Well controlled, no changes to meds. Encouraged heart healthy diet such as the DASH diet and exercise as tolerated.  °

## 2020-03-21 NOTE — Assessment & Plan Note (Signed)
Improved con't metoprolol F/u cardiology prn

## 2020-05-17 ENCOUNTER — Other Ambulatory Visit: Payer: Self-pay | Admitting: Family Medicine

## 2020-05-17 DIAGNOSIS — F32A Depression, unspecified: Secondary | ICD-10-CM

## 2020-05-17 DIAGNOSIS — F419 Anxiety disorder, unspecified: Secondary | ICD-10-CM

## 2020-05-18 NOTE — Telephone Encounter (Signed)
Requesting: alprazolam 0.25mg  Contract: 01/21/2019 UDS: 01/21/2019 Last Visit: 03/20/2020 Next Visit: 09/14/2020 Last Refill: 03/19/2020 #60 and 1RF  Please Advise

## 2020-06-03 ENCOUNTER — Other Ambulatory Visit: Payer: Self-pay | Admitting: Family Medicine

## 2020-06-03 DIAGNOSIS — Z1231 Encounter for screening mammogram for malignant neoplasm of breast: Secondary | ICD-10-CM

## 2020-06-06 ENCOUNTER — Other Ambulatory Visit: Payer: Self-pay | Admitting: Family Medicine

## 2020-07-13 ENCOUNTER — Telehealth: Payer: Self-pay | Admitting: Family Medicine

## 2020-07-13 ENCOUNTER — Other Ambulatory Visit: Payer: Self-pay | Admitting: Family Medicine

## 2020-07-13 DIAGNOSIS — F419 Anxiety disorder, unspecified: Secondary | ICD-10-CM

## 2020-07-13 DIAGNOSIS — F32A Depression, unspecified: Secondary | ICD-10-CM

## 2020-07-13 MED ORDER — ALPRAZOLAM 0.25 MG PO TABS: ORAL_TABLET | ORAL | 1 refills | Status: DC

## 2020-07-13 NOTE — Telephone Encounter (Signed)
Requesting: alprazolam 0.25mg  Contract: 01/21/2019 UDS: 01/21/2019 Last Visit: 03/20/2020 Next Visit: 09/14/20 Last Refill: 05/18/2020 #60 and 1RF  Please Advise

## 2020-07-20 ENCOUNTER — Ambulatory Visit
Admission: RE | Admit: 2020-07-20 | Discharge: 2020-07-20 | Disposition: A | Discharge status: Home / Self Care | Payer: BC Managed Care – PPO | Source: Ambulatory Visit | Attending: Family Medicine | Admitting: Family Medicine

## 2020-07-20 ENCOUNTER — Other Ambulatory Visit: Payer: Self-pay

## 2020-07-20 DIAGNOSIS — Z1231 Encounter for screening mammogram for malignant neoplasm of breast: Secondary | ICD-10-CM

## 2020-09-14 ENCOUNTER — Other Ambulatory Visit: Payer: Self-pay | Admitting: Family Medicine

## 2020-09-14 ENCOUNTER — Encounter: Payer: BC Managed Care – PPO | Admitting: Family Medicine

## 2020-09-14 DIAGNOSIS — F419 Anxiety disorder, unspecified: Secondary | ICD-10-CM

## 2020-09-14 DIAGNOSIS — F32A Depression, unspecified: Secondary | ICD-10-CM

## 2020-09-14 NOTE — Telephone Encounter (Signed)
Requesting: alprazolam 0.25mg  Contract: 06/01/2017 UDS: 01/21/2019 Last Visit: 03/20/2020 Next Visit: 10/13/2020 Last Refill: 07/13/2020 #60 and 1RF  Please Advise

## 2020-09-17 ENCOUNTER — Encounter: Payer: BC Managed Care – PPO | Admitting: Family Medicine

## 2020-10-12 ENCOUNTER — Other Ambulatory Visit: Payer: Self-pay

## 2020-10-13 ENCOUNTER — Other Ambulatory Visit: Payer: Self-pay

## 2020-10-13 ENCOUNTER — Encounter: Payer: Self-pay | Admitting: Family Medicine

## 2020-10-13 ENCOUNTER — Ambulatory Visit (INDEPENDENT_AMBULATORY_CARE_PROVIDER_SITE_OTHER): Payer: BC Managed Care – PPO | Admitting: Family Medicine

## 2020-10-13 VITALS — BP 110/70 | HR 67 | Temp 98.7°F | Resp 18 | Ht 65.0 in | Wt 207.2 lb

## 2020-10-13 DIAGNOSIS — I10 Essential (primary) hypertension: Secondary | ICD-10-CM

## 2020-10-13 DIAGNOSIS — E039 Hypothyroidism, unspecified: Secondary | ICD-10-CM | POA: Diagnosis not present

## 2020-10-13 DIAGNOSIS — F411 Generalized anxiety disorder: Secondary | ICD-10-CM

## 2020-10-13 DIAGNOSIS — F339 Major depressive disorder, recurrent, unspecified: Secondary | ICD-10-CM | POA: Diagnosis not present

## 2020-10-13 DIAGNOSIS — C50919 Malignant neoplasm of unspecified site of unspecified female breast: Secondary | ICD-10-CM

## 2020-10-13 DIAGNOSIS — F32A Depression, unspecified: Secondary | ICD-10-CM

## 2020-10-13 DIAGNOSIS — F419 Anxiety disorder, unspecified: Secondary | ICD-10-CM

## 2020-10-13 DIAGNOSIS — Z0000 Encounter for general adult medical examination without abnormal findings: Secondary | ICD-10-CM | POA: Diagnosis not present

## 2020-10-13 DIAGNOSIS — E890 Postprocedural hypothyroidism: Secondary | ICD-10-CM

## 2020-10-13 DIAGNOSIS — Z853 Personal history of malignant neoplasm of breast: Secondary | ICD-10-CM

## 2020-10-13 DIAGNOSIS — K219 Gastro-esophageal reflux disease without esophagitis: Secondary | ICD-10-CM

## 2020-10-13 MED ORDER — ALPRAZOLAM 0.25 MG PO TABS: ORAL_TABLET | ORAL | 1 refills | Status: DC

## 2020-10-13 NOTE — Assessment & Plan Note (Signed)
stable °

## 2020-10-13 NOTE — Assessment & Plan Note (Signed)
Stable con't omeprazole

## 2020-10-13 NOTE — Progress Notes (Signed)
Subjective:   By signing my name below, I, Norma Kelley, attest that this documentation has been prepared under the direction and in the presence of Dr. Roma Schanz, DO. 10/13/2020     Patient ID: Norma Kelley, female    DOB: [birth year], 60 y.o.   MRN: [id number]  Chief Complaint  Patient presents with  . Annual Exam    Pt states not fasting     HPI Patient is in today for a comprehensive physical exam. She is requesting a refill on 0.25 xanax 3x daily PO as needed. She has 2 Covid-19 vaccines and is interested in getting the 3rd booster vaccine. She has recently stopped showing symptoms from Covid-19 last week.  She has an upcoming appointment with her cardiologist, Dr. Kirk Ruths, in August, 2022. She does not see a gynecologist at this time because she no longer has a cervix or uterus. Her dermatologist manages her skin moles at this time. Her blood pressure is well managed at this time. She recently had a breast lumpectomy on her left breast and only has her right breast. She last ate at 9:15 am.    Past Medical History:  Diagnosis Date  . Allergy    seasonal  . Anxiety   . Arthritis    fingers  . Breast cancer ([place]) 2007   left breast cancer  . Depression   . Diet-controlled diabetes mellitus ([place])    pt. denies 11/21/18  . GERD (gastroesophageal reflux disease)   . Hypothyroidism     Past Surgical History:  Procedure Laterality Date  . ABDOMINAL HYSTERECTOMY    . BACK SURGERY  2013   lumbar  . BACK SURGERY    . 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 BREAST LUMPECTOMY WITH RADIOACTIVE SEED LOCALIZATION Right 08/17/2015   Procedure: BREAST LUMPECTOMY WITH RADIOACTIVE SEED LOCALIZATION;  Surgeon: Excell Seltzer, MD;  Location: [facility];  Service: General;  Laterality: Right;  . BREAST SURGERY     Mastectomy and reconstruction  . COLONOSCOPY    . MASTECTOMY Left 2007   mastectomy and tamoxifen  . POLYPECTOMY    .  TONSILLECTOMY      Family History  Problem Relation Age of Onset  . Hyperlipidemia Mother   . Osteoporosis Mother   . Diabetes Mother   . Heart attack Mother 21  . Breast cancer Mother 68  . Heart attack Father 11       MASSIVE MI,  Died age 54  . Colon polyps Father   . Colon cancer Maternal Grandfather        dx in late 60s-early 70s  . Head & neck cancer Maternal Grandmother        cancer of the sinuses  . Pancreatic cancer Neg Hx   . Stomach cancer Neg Hx   . Rectal cancer Neg Hx   . Esophageal cancer Neg Hx     Social History   Socioeconomic History  . Marital status: Married    Spouse name: Not on file  . Number of children: 3  . Years of education: Not on file  . Highest education level: Not on file  Occupational History    Employer: [employer]  Tobacco Use  . Smoking status: Former Smoker    Packs/day: 1.00    Years: 20.00    Pack years: 20.00    Types: Cigarettes  . Smokeless tobacco: Never Used  . Tobacco comment: Quit 14 years ago  Vaping  Use  . Vaping Use: Never used  Substance and Sexual Activity  . Alcohol use: Yes    Comment: Occasional  . Drug use: No  . Sexual activity: Yes  Other Topics Concern  . Not on file  Social History Narrative   Lives with husband.  Occasional exercise.   Social Determinants of Health   Financial Resource Strain: Not on file  Food Insecurity: Not on file  Transportation Needs: Not on file  Physical Activity: Not on file  Stress: Not on file  Social Connections: Not on file  Intimate Partner Violence: Not on file    Outpatient Medications Prior to Visit  Medication Sig Dispense Refill  . levothyroxine (SYNTHROID) 100 MCG tablet TAKE 1 TABLET(100 MCG) BY MOUTH DAILY 90 tablet 3  . metoprolol succinate (TOPROL-XL) 50 MG 24 hr tablet 1 tab po qd Take with or immediately following a meal. 90 tablet 3  . Omeprazole 20 MG TBEC Take 1 tablet by mouth daily.    . valACYclovir (VALTREX) 500 MG tablet Take 1  tablet (500 mg total) by mouth daily as needed. 90 tablet 3  . ALPRAZolam (XANAX) 0.25 MG tablet TAKE 1 TABLET(0.25 MG) BY MOUTH THREE TIMES DAILY AS NEEDED FOR ANXIETY 60 tablet 0   No facility-administered medications prior to visit.    Allergies  Allergen Reactions  . Sulfamethoxazole Itching    Swelling around eyes  . Sulfonamide Derivatives     ROS     Objective:    Physical Exam Constitutional:      General: She is not in acute distress.    Appearance: Normal appearance. She is not ill-appearing.  HENT:     Head: Normocephalic and atraumatic.     Right Ear: Tympanic membrane and external ear normal.     Left Ear: Tympanic membrane and external ear normal.  Eyes:     Extraocular Movements: Extraocular movements intact.     Pupils: Pupils are equal, round, and reactive to light.  Cardiovascular:     Rate and Rhythm: Normal rate and regular rhythm.     Pulses: Normal pulses.     Heart sounds: No murmur heard. No gallop.   Pulmonary:     Effort: Pulmonary effort is normal. No respiratory distress.     Breath sounds: Normal breath sounds. No wheezing, rhonchi or rales.  Abdominal:     General: Bowel sounds are normal. There is no distension.     Palpations: Abdomen is soft. There is no mass.     Tenderness: There is no abdominal tenderness. There is no guarding or rebound.     Hernia: No hernia is present.  Skin:    General: Skin is warm and dry.  Neurological:     Mental Status: She is alert and oriented to person, place, and time.  Psychiatric:        Behavior: Behavior normal.     BP 110/70 (BP Location: Right Arm, Patient Position: Sitting, Cuff Size: Large)   Pulse 67   Temp 98.7 F (37.1 C) (Oral)   Resp 18   Ht 5\' 5"  (1.651 m)   Wt 207 lb 3.2 oz (94 kg)   SpO2 98%   BMI 34.48 kg/m  Wt Readings from Last 3 Encounters:  10/13/20 207 lb 3.2 oz (94 kg)  03/20/20 201 lb 9.6 oz (91.4 kg)  12/25/19 198 lb 1.9 oz (89.9 kg)    Diabetic Foot Exam -  Simple   No data filed  Lab Results  Component Value Date   WBC 9.2 04/22/2019   HGB 13.5 04/22/2019   HCT 40.4 04/22/2019   PLT 309.0 04/22/2019   GLUCOSE 107 (H) 12/20/2019   CHOL 146 12/20/2019   TRIG 137 12/20/2019   HDL 47 (L) 12/20/2019   LDLCALC 77 12/20/2019   ALT 12 12/20/2019   AST 13 12/20/2019   NA 141 12/20/2019   K 4.2 12/20/2019   CL 105 12/20/2019   CREATININE 0.78 12/20/2019   BUN 13 12/20/2019   CO2 27 12/20/2019   TSH 0.88 12/20/2019   HGBA1C 6.0 03/10/2017   MICROALBUR 0.4 07/16/2012    Lab Results  Component Value Date   TSH 0.88 12/20/2019   Lab Results  Component Value Date   WBC 9.2 04/22/2019   HGB 13.5 04/22/2019   HCT 40.4 04/22/2019   MCV 88.6 04/22/2019   PLT 309.0 04/22/2019   Lab Results  Component Value Date   NA 141 12/20/2019   K 4.2 12/20/2019   CO2 27 12/20/2019   GLUCOSE 107 (H) 12/20/2019   BUN 13 12/20/2019   CREATININE 0.78 12/20/2019   BILITOT 0.7 12/20/2019   ALKPHOS 81 07/22/2019   AST 13 12/20/2019   ALT 12 12/20/2019   PROT 6.9 12/20/2019   ALBUMIN 4.4 07/22/2019   CALCIUM 9.8 12/20/2019   ANIONGAP 9 09/10/2015   GFR 82.37 07/22/2019   Lab Results  Component Value Date   CHOL 146 12/20/2019   Lab Results  Component Value Date   HDL 47 (L) 12/20/2019   Lab Results  Component Value Date   LDLCALC 77 12/20/2019   Lab Results  Component Value Date   TRIG 137 12/20/2019   Lab Results  Component Value Date   CHOLHDL 3.1 12/20/2019   Lab Results  Component Value Date   HGBA1C 6.0 03/10/2017   Mammogram- Last completed 07/20/2020. Results normal. Repeat in 1 year. Pap smear- Last completed 03/13/2014. Results normal. Dexa- Not yet completed. Colonoscopy- Last completed 12/05/2018. Results showed two 3-4 mm polyps in sigmoid colon and the ascending colon, mild diverticulosis in the sigmoid colon and descending and ascending colon, two 1-2 mm polyps in the rectum and the cecum, otherwise results  are normal. Repeat in 3-5 years.     Assessment & Plan:   Problem List Items Addressed This Visit      Unprioritized   Anxiety state    stable      Relevant Medications   ALPRAZolam (XANAX) 0.25 MG tablet   Breast cancer (HCC)   Relevant Medications   ALPRAZolam (XANAX) 0.25 MG tablet   BREAST CANCER, HX OF    No reoccurrence mamogram utd       Depression, recurrent (HCC)   Relevant Medications   ALPRAZolam (XANAX) 0.25 MG tablet   Essential hypertension    Well controlled, no changes to meds. Encouraged heart healthy diet such as the DASH diet and exercise as tolerated.       GERD (gastroesophageal reflux disease)    Stable con't omeprazole      Hypothyroid    Check labs  con't synthroid      Relevant Orders   TSH   Hypothyroidism    Stable Cont meds  Check labs      Relevant Orders   TSH   Preventative health care - Primary    ghm utd Check labs       Relevant Orders   TSH   Lipid panel   CBC  with Differential/Platelet   Comprehensive metabolic panel    Other Visit Diagnoses    Primary hypertension       Relevant Orders   TSH   Lipid panel   CBC with Differential/Platelet   Comprehensive metabolic panel   Anxiety and depression       Relevant Medications   ALPRAZolam (XANAX) 0.25 MG tablet       Meds ordered this encounter  Medications  . ALPRAZolam (XANAX) 0.25 MG tablet    Sig: 1 po tid prn    Dispense:  60 tablet    Refill:  1    I, Dr. Roma Schanz, DO, personally preformed the services described in this documentation.  All medical record entries made by the scribe were at my direction and in my presence.  I have reviewed the chart and discharge instructions (if applicable) and agree that the record reflects my personal performance and is accurate and complete. 10/13/2020   I,Norma Kelley,acting as a scribe for Ann Held, DO.,have documented all relevant documentation on the behalf of Ann Held,  DO,as directed by  Ann Held, DO while in the presence of Ann Held, DO.   Ann Held, DO

## 2020-10-13 NOTE — Assessment & Plan Note (Signed)
Well controlled, no changes to meds. Encouraged heart healthy diet such as the DASH diet and exercise as tolerated.  °

## 2020-10-13 NOTE — Assessment & Plan Note (Signed)
Stable Cont meds Check labs 

## 2020-10-13 NOTE — Assessment & Plan Note (Signed)
ghm utd Check labs  

## 2020-10-13 NOTE — Assessment & Plan Note (Signed)
Check labs con't synthroid 

## 2020-10-13 NOTE — Patient Instructions (Signed)
Preventive Care 62-62 Years Old, Female Preventive care refers to lifestyle choices and visits with your health care provider that can promote health and wellness. This includes:  A yearly physical exam. This is also called an annual wellness visit.  Regular dental and eye exams.  Immunizations.  Screening for certain conditions.  Healthy lifestyle choices, such as: ? Eating a healthy diet. ? Getting regular exercise. ? Not using drugs or products that contain nicotine and tobacco. ? Limiting alcohol use. What can I expect for my preventive care visit? Physical exam Your health care provider will check your:  Height and weight. These may be used to calculate your BMI (body mass index). BMI is a measurement that tells if you are at a healthy weight.  Heart rate and blood pressure.  Body temperature.  Skin for abnormal spots. Counseling Your health care provider may ask you questions about your:  Past medical problems.  Family's medical history.  Alcohol, tobacco, and drug use.  Emotional well-being.  Home life and relationship well-being.  Sexual activity.  Diet, exercise, and sleep habits.  Work and work Statistician.  Access to firearms.  Method of birth control.  Menstrual cycle.  Pregnancy history. What immunizations do I need? Vaccines are usually given at various ages, according to a schedule. Your health care provider will recommend vaccines for you based on your age, medical history, and lifestyle or other factors, such as travel or where you work.   What tests do I need? Blood tests  Lipid and cholesterol levels. These may be checked every 5 years, or more often if you are over 62 years old.  Hepatitis C test.  Hepatitis B test. Screening  Lung cancer screening. You may have this screening every year starting at age 62 if you have a 30-pack-year history of smoking and currently smoke or have quit within the past 15 years.  Colorectal cancer  screening. ? All adults should have this screening starting at age 62 and continuing until age 17. ? Your health care provider may recommend screening at age 62 if you are at increased risk. ? You will have tests every 1-10 years, depending on your results and the type of screening test.  Diabetes screening. ? This is done by checking your blood sugar (glucose) after you have not eaten for a while (fasting). ? You may have this done every 1-3 years.  Mammogram. ? This may be done every 1-2 years. ? Talk with your health care provider about when you should start having regular mammograms. This may depend on whether you have a family history of breast cancer.  BRCA-related cancer screening. This may be done if you have a family history of breast, ovarian, tubal, or peritoneal cancers.  Pelvic exam and Pap test. ? This may be done every 3 years starting at age 62. ? Starting at age 62, this may be done every 5 years if you have a Pap test in combination with an HPV test. Other tests  STD (sexually transmitted disease) testing, if you are at risk.  Bone density scan. This is done to screen for osteoporosis. You may have this scan if you are at high risk for osteoporosis. Talk with your health care provider about your test results, treatment options, and if necessary, the need for more tests. Follow these instructions at home: Eating and drinking  Eat a diet that includes fresh fruits and vegetables, whole grains, lean protein, and low-fat dairy products.  Take vitamin and mineral supplements  as recommended by your health care provider.  Do not drink alcohol if: ? Your health care provider tells you not to drink. ? You are pregnant, may be pregnant, or are planning to become pregnant.  If you drink alcohol: ? Limit how much you have to 0-1 drink a day. ? Be aware of how much alcohol is in your drink. In the [place] one drink equals one 12 oz bottle of beer (355 mL), one 5 oz glass of  wine (148 mL), or one 1 oz glass of hard liquor (44 mL).   Lifestyle  Take daily care of your teeth and gums. Brush your teeth every morning and night with fluoride toothpaste. Floss one time each day.  Stay active. Exercise for at least 30 minutes 5 or more days each week.  Do not use any products that contain nicotine or tobacco, such as cigarettes, e-cigarettes, and chewing tobacco. If you need help quitting, ask your health care provider.  Do not use drugs.  If you are sexually active, practice safe sex. Use a condom or other form of protection to prevent STIs (sexually transmitted infections).  If you do not wish to become pregnant, use a form of birth control. If you plan to become pregnant, see your health care provider for a prepregnancy visit.  If told by your health care provider, take low-dose aspirin daily starting at age 62.  Find healthy ways to cope with stress, such as: ? Meditation, yoga, or listening to music. ? Journaling. ? Talking to a trusted person. ? Spending time with friends and family. Safety  Always wear your seat belt while driving or riding in a vehicle.  Do not drive: ? If you have been drinking alcohol. Do not ride with someone who has been drinking. ? When you are tired or distracted. ? While texting.  Wear a helmet and other protective equipment during sports activities.  If you have firearms in your house, make sure you follow all gun safety procedures. What's next?  Visit your health care provider once a year for an annual wellness visit.  Ask your health care provider how often you should have your eyes and teeth checked.  Stay up to date on all vaccines. This information is not intended to replace advice given to you by your health care provider. Make sure you discuss any questions you have with your health care provider. Document Revised: 01/28/2020 Document Reviewed: 01/04/2018 Elsevier Patient Education  2021 Elsevier Inc.  

## 2020-10-13 NOTE — Assessment & Plan Note (Signed)
No reoccurrence mamogram utd

## 2020-10-14 ENCOUNTER — Encounter: Payer: Self-pay | Admitting: Family Medicine

## 2020-10-14 LAB — TSH: TSH: 3.52 u[IU]/mL (ref 0.35–4.50)

## 2020-10-14 LAB — COMPREHENSIVE METABOLIC PANEL
ALT: 16 U/L (ref 0–35)
AST: 16 U/L (ref 0–37)
Albumin: 4.7 g/dL (ref 3.5–5.2)
Alkaline Phosphatase: 84 U/L (ref 39–117)
BUN: 12 mg/dL (ref 6–23)
CO2: 27 mEq/L (ref 19–32)
Calcium: 10 mg/dL (ref 8.4–10.5)
Chloride: 105 mEq/L (ref 96–112)
Creatinine, Ser: 0.87 mg/dL (ref 0.40–1.20)
GFR: 71.54 mL/min (ref >60.00)
Glucose, Bld: 88 mg/dL (ref 70–99)
Potassium: 4.7 mEq/L (ref 3.5–5.1)
Sodium: 141 mEq/L (ref 135–145)
Total Bilirubin: 0.7 mg/dL (ref 0.2–1.2)
Total Protein: 7.2 g/dL (ref 6.0–8.3)

## 2020-10-14 LAB — CBC WITH DIFFERENTIAL/PLATELET
Basophils Absolute: 0.1 10*3/uL (ref 0.0–0.1)
Basophils Relative: 0.6 % (ref 0.0–3.0)
Eosinophils Absolute: 0.2 10*3/uL (ref 0.0–0.7)
Eosinophils Relative: 1.8 % (ref 0.0–5.0)
HCT: 39.7 % (ref 36.0–46.0)
Hemoglobin: 13.2 g/dL (ref 12.0–15.0)
Lymphocytes Relative: 21.3 % (ref 12.0–46.0)
Lymphs Abs: 1.9 10*3/uL (ref 0.7–4.0)
MCHC: 33.1 g/dL (ref 30.0–36.0)
MCV: 88.1 fl (ref 78.0–100.0)
Monocytes Absolute: 0.5 10*3/uL (ref 0.1–1.0)
Monocytes Relative: 6 % (ref 3.0–12.0)
Neutro Abs: 6.4 10*3/uL (ref 1.4–7.7)
Neutrophils Relative %: 70.3 % (ref 43.0–77.0)
Platelets: 292 10*3/uL (ref 150.0–400.0)
RBC: 4.51 Mil/uL (ref 3.87–5.11)
RDW: 13 % (ref 11.5–15.5)
WBC: 9.1 10*3/uL (ref 4.0–10.5)

## 2020-10-14 LAB — LIPID PANEL
Cholesterol: 169 mg/dL (ref 0–200)
HDL: 50.9 mg/dL (ref >39.00)
LDL Cholesterol: 92 mg/dL (ref 0–99)
NonHDL: 117.71
Total CHOL/HDL Ratio: 3
Triglycerides: 128 mg/dL (ref 0.0–149.0)
VLDL: 25.6 mg/dL (ref 0.0–40.0)

## 2020-10-15 ENCOUNTER — Other Ambulatory Visit: Payer: Self-pay

## 2020-10-15 DIAGNOSIS — E039 Hypothyroidism, unspecified: Secondary | ICD-10-CM

## 2020-10-15 MED ORDER — LEVOTHYROXINE SODIUM 112 MCG PO TABS: ORAL_TABLET | ORAL | 0 refills | Status: DC

## 2020-12-01 ENCOUNTER — Other Ambulatory Visit: Payer: Self-pay | Admitting: Family Medicine

## 2020-12-01 DIAGNOSIS — F419 Anxiety disorder, unspecified: Secondary | ICD-10-CM

## 2020-12-02 NOTE — Telephone Encounter (Signed)
Requesting: alprazolam 0.'25mg'$  Contract: 01/21/2019 UDS: 01/21/2019 Last Visit: 10/13/2020 Next Visit: 12/14/2020 Last Refill: 10/13/2020 #60 and 1RF   Please Advise

## 2020-12-14 ENCOUNTER — Other Ambulatory Visit: Payer: Self-pay

## 2020-12-14 ENCOUNTER — Ambulatory Visit: Payer: BC Managed Care – PPO | Admitting: Family Medicine

## 2020-12-14 VITALS — BP 132/78 | HR 71 | Temp 98.5°F | Resp 16 | Ht 66.0 in | Wt 209.0 lb

## 2020-12-14 DIAGNOSIS — R002 Palpitations: Secondary | ICD-10-CM

## 2020-12-14 DIAGNOSIS — E890 Postprocedural hypothyroidism: Secondary | ICD-10-CM

## 2020-12-14 DIAGNOSIS — I10 Essential (primary) hypertension: Secondary | ICD-10-CM

## 2020-12-14 DIAGNOSIS — E039 Hypothyroidism, unspecified: Secondary | ICD-10-CM

## 2020-12-14 MED ORDER — METOPROLOL SUCCINATE ER 25 MG PO TB24: 25.0000 mg | ORAL_TABLET | Freq: Every day | ORAL | 3 refills | Status: DC

## 2020-12-14 NOTE — Patient Instructions (Signed)
https://www.nhlbi.nih.gov/files/docs/public/heart/dash_brief.pdf">  DASH Eating Plan DASH stands for Dietary Approaches to Stop Hypertension. The DASH eating plan is a healthy eating plan that has been shown to: Reduce high blood pressure (hypertension). Reduce your risk for type 2 diabetes, heart disease, and stroke. Help with weight loss. What are tips for following this plan? Reading food labels Check food labels for the amount of salt (sodium) per serving. Choose foods with less than 5 percent of the Daily Value of sodium. Generally, foods with less than 300 milligrams (mg) of sodium per serving fit into this eating plan. To find whole grains, look for the word "whole" as the first word in the ingredient list. Shopping Buy products labeled as "low-sodium" or "no salt added." Buy fresh foods. Avoid canned foods and pre-made or frozen meals. Cooking Avoid adding salt when cooking. Use salt-free seasonings or herbs instead of table salt or sea salt. Check with your health care provider or pharmacist before using salt substitutes. Do not fry foods. Cook foods using healthy methods such as baking, boiling, grilling, roasting, and broiling instead. Cook with heart-healthy oils, such as olive, canola, avocado, soybean, or sunflower oil. Meal planning  Eat a balanced diet that includes: 4 or more servings of fruits and 4 or more servings of vegetables each day. Try to fill one-half of your plate with fruits and vegetables. 6-8 servings of whole grains each day. Less than 6 oz (170 g) of lean meat, poultry, or fish each day. A 3-oz (85-g) serving of meat is about the same size as a deck of cards. One egg equals 1 oz (28 g). 2-3 servings of low-fat dairy each day. One serving is 1 cup (237 mL). 1 serving of nuts, seeds, or beans 5 times each week. 2-3 servings of heart-healthy fats. Healthy fats called omega-3 fatty acids are found in foods such as walnuts, flaxseeds, fortified milks, and eggs.  These fats are also found in cold-water fish, such as sardines, salmon, and mackerel. Limit how much you eat of: Canned or prepackaged foods. Food that is high in trans fat, such as some fried foods. Food that is high in saturated fat, such as fatty meat. Desserts and other sweets, sugary drinks, and other foods with added sugar. Full-fat dairy products. Do not salt foods before eating. Do not eat more than 4 egg yolks a week. Try to eat at least 2 vegetarian meals a week. Eat more home-cooked food and less restaurant, buffet, and fast food.  Lifestyle When eating at a restaurant, ask that your food be prepared with less salt or no salt, if possible. If you drink alcohol: Limit how much you use to: 0-1 drink a day for women who are not pregnant. 0-2 drinks a day for men. Be aware of how much alcohol is in your drink. In the [place] one drink equals one 12 oz bottle of beer (355 mL), one 5 oz glass of wine (148 mL), or one 1 oz glass of hard liquor (44 mL). General information Avoid eating more than 2,300 mg of salt a day. If you have hypertension, you may need to reduce your sodium intake to 1,500 mg a day. Work with your health care provider to maintain a healthy body weight or to lose weight. Ask what an ideal weight is for you. Get at least 30 minutes of exercise that causes your heart to beat faster (aerobic exercise) most days of the week. Activities may include walking, swimming, or biking. Work with your health care provider   or dietitian to adjust your eating plan to your individual calorie needs. What foods should I eat? Fruits All fresh, dried, or frozen fruit. Canned fruit in natural juice (without addedsugar). Vegetables Fresh or frozen vegetables (raw, steamed, roasted, or grilled). Low-sodium or reduced-sodium tomato and vegetable juice. Low-sodium or reduced-sodium tomatosauce and tomato paste. Low-sodium or reduced-sodium canned vegetables. Grains Whole-grain or  whole-wheat bread. Whole-grain or whole-wheat pasta. Brown rice. Oatmeal. Quinoa. Bulgur. Whole-grain and low-sodium cereals. Pita bread.Low-fat, low-sodium crackers. Whole-wheat flour tortillas. Meats and other proteins Skinless chicken or turkey. Ground chicken or turkey. Pork with fat trimmed off. Fish and seafood. Egg whites. Dried beans, peas, or lentils. Unsalted nuts, nut butters, and seeds. Unsalted canned beans. Lean cuts of beef with fat trimmed off. Low-sodium, lean precooked or cured meat, such as sausages or meatloaves. Dairy Low-fat (1%) or fat-free (skim) milk. Reduced-fat, low-fat, or fat-free cheeses. Nonfat, low-sodium ricotta or cottage cheese. Low-fat or nonfatyogurt. Low-fat, low-sodium cheese. Fats and oils Soft margarine without trans fats. Vegetable oil. Reduced-fat, low-fat, or light mayonnaise and salad dressings (reduced-sodium). Canola, safflower, olive, avocado, soybean, andsunflower oils. Avocado. Seasonings and condiments Herbs. Spices. Seasoning mixes without salt. Other foods Unsalted popcorn and pretzels. Fat-free sweets. The items listed above may not be a complete list of foods and beverages you can eat. Contact a dietitian for more information. What foods should I avoid? Fruits Canned fruit in a light or heavy syrup. Fried fruit. Fruit in cream or buttersauce. Vegetables Creamed or fried vegetables. Vegetables in a cheese sauce. Regular canned vegetables (not low-sodium or reduced-sodium). Regular canned tomato sauce and paste (not low-sodium or reduced-sodium). Regular tomato and vegetable juice(not low-sodium or reduced-sodium). Pickles. Olives. Grains Baked goods made with fat, such as croissants, muffins, or some breads. Drypasta or rice meal packs. Meats and other proteins Fatty cuts of meat. Ribs. Fried meat. Bacon. Bologna, salami, and other precooked or cured meats, such as sausages or meat loaves. Fat from the back of a pig (fatback). Bratwurst.  Salted nuts and seeds. Canned beans with added salt. Canned orsmoked fish. Whole eggs or egg yolks. Chicken or turkey with skin. Dairy Whole or 2% milk, cream, and half-and-half. Whole or full-fat cream cheese. Whole-fat or sweetened yogurt. Full-fat cheese. Nondairy creamers. Whippedtoppings. Processed cheese and cheese spreads. Fats and oils Butter. Stick margarine. Lard. Shortening. Ghee. Bacon fat. Tropical oils, suchas coconut, palm kernel, or palm oil. Seasonings and condiments Onion salt, garlic salt, seasoned salt, table salt, and sea salt. Worcestershire sauce. Tartar sauce. Barbecue sauce. Teriyaki sauce. Soy sauce, including reduced-sodium. Steak sauce. Canned and packaged gravies. Fish sauce. Oyster sauce. Cocktail sauce. Store-bought horseradish. Ketchup. Mustard. Meat flavorings and tenderizers. Bouillon cubes. Hot sauces. Pre-made or packaged marinades. Pre-made or packaged taco seasonings. Relishes. Regular saladdressings. Other foods Salted popcorn and pretzels. The items listed above may not be a complete list of foods and beverages you should avoid. Contact a dietitian for more information. Where to find more information National Heart, Lung, and Blood Institute: www.nhlbi.nih.gov American Heart Association: www.heart.org Academy of Nutrition and Dietetics: www.eatright.org National Kidney Foundation: www.kidney.org Summary The DASH eating plan is a healthy eating plan that has been shown to reduce high blood pressure (hypertension). It may also reduce your risk for type 2 diabetes, heart disease, and stroke. When on the DASH eating plan, aim to eat more fresh fruits and vegetables, whole grains, lean proteins, low-fat dairy, and heart-healthy fats. With the DASH eating plan, you should limit salt (sodium) intake to 2,300   mg a day. If you have hypertension, you may need to reduce your sodium intake to 1,500 mg a day. Work with your health care provider or dietitian to adjust  your eating plan to your individual calorie needs. This information is not intended to replace advice given to you by your health care provider. Make sure you discuss any questions you have with your healthcare provider. Document Revised: 03/29/2019 Document Reviewed: 03/29/2019 Elsevier Patient Education  2022 Elsevier Inc.  

## 2020-12-14 NOTE — Progress Notes (Signed)
Subjective:   By signing my name below, I, Norma Kelley, attest that this documentation has been prepared under the direction and in the presence of Norma Kelley R DO. 12/14/2020    Patient ID: Norma Kelley, female    DOB: [birth year], 60 y.o.   MRN: [id number]  Chief Complaint  Patient presents with   Hypothyroidism    Here for follow up after medication dose change    HPI Patient is in today for an office visit and a f/u on hypothyroidism. She was experiencing lightheadedness and dizziness along with a low blood pressure that ranged from 100/60-110/70 3 weeks ago. She started taking 25 mg metoprolol succinate PO daily instead of 50 mg which resolved her symptoms. She still experiences occasional palpitations. She is still worried about her thyroid levels and say they range around 3.0 and above.     Past Medical History:  Diagnosis Date   Allergy    seasonal   Anxiety    Arthritis    fingers   Breast cancer ([place]) 2007   left breast cancer   Depression    Diet-controlled diabetes mellitus ([place])    pt. 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   BREAST LUMPECTOMY WITH RADIOACTIVE SEED LOCALIZATION Right 08/17/2015   Procedure: BREAST LUMPECTOMY WITH RADIOACTIVE SEED LOCALIZATION;  Surgeon: Excell Seltzer, MD;  Location: [facility];  Service: General;  Laterality: Right;   BREAST SURGERY     Mastectomy and reconstruction   COLONOSCOPY     MASTECTOMY Left 2007   mastectomy and tamoxifen   POLYPECTOMY     TONSILLECTOMY      Family History  Problem Relation Age of Onset   Hyperlipidemia Mother    Osteoporosis Mother    Diabetes Mother    Heart attack Mother 28   Breast cancer Mother 44   Heart attack Father 62       MASSIVE  MI,  Died age 75   Colon polyps Father    Colon cancer Maternal Grandfather        dx in late 60s-early 70s   Head & neck cancer Maternal Grandmother        cancer of the sinuses   Pancreatic cancer Neg Hx    Stomach cancer Neg Hx    Rectal cancer Neg Hx    Esophageal cancer Neg Hx     Social History   Socioeconomic History   Marital status: Married    Spouse name: Not on file   Number of children: 3   Years of education: Not on file   Highest education level: Not on file  Occupational History    Employer: [employer]  Tobacco Use   Smoking status: Former    Packs/day: 1.00    Years: 20.00    Pack years: 20.00    Types: Cigarettes   Smokeless tobacco: Never   Tobacco comments:    Quit 14 years ago  Vaping Use   Vaping Use: Never used  Substance and Sexual Activity   Alcohol use: Yes    Comment: Occasional   Drug use: No   Sexual activity: Yes  Other Topics Concern   Not on file  Social History Narrative  Lives with husband.  Occasional exercise.   Social Determinants of Health   Financial Resource Strain: Not on file  Food Insecurity: Not on file  Transportation Needs: Not on file  Physical Activity: Not on file  Stress: Not on file  Social Connections: Not on file  Intimate Partner Violence: Not on file    Outpatient Medications Prior to Visit  Medication Sig Dispense Refill   ALPRAZolam (XANAX) 0.25 MG tablet TAKE 1 TABLET BY MOUTH THREE TIMES DAILY AS NEEDED 60 tablet 1   levothyroxine (SYNTHROID) 112 MCG tablet TAKE 1 TABLET(100 MCG) BY MOUTH DAILY 90 tablet 0   Omeprazole 20 MG TBEC Take 1 tablet by mouth daily.     valACYclovir (VALTREX) 500 MG tablet Take 1 tablet (500 mg total) by mouth daily as needed. 90 tablet 3   metoprolol succinate (TOPROL-XL) 50 MG 24 hr tablet 1 tab po qd Take with or immediately following a meal. 90 tablet 3   No facility-administered medications prior to visit.    Allergies  Allergen Reactions   Sulfamethoxazole  Itching    Swelling around eyes   Sulfonamide Derivatives     Review of Systems  Constitutional:  Negative for fever and malaise/fatigue.  HENT:  Negative for congestion.   Eyes:  Negative for blurred vision.  Respiratory:  Negative for cough and shortness of breath.   Cardiovascular:  Positive for palpitations. Negative for chest pain and leg swelling.  Gastrointestinal:  Negative for vomiting.  Musculoskeletal:  Negative for back pain.  Skin:  Negative for rash.  Neurological:  Negative for loss of consciousness and headaches.      Objective:    Physical Exam Vitals reviewed.  Constitutional:      General: She is not in acute distress.    Appearance: Normal appearance. She is not ill-appearing.  HENT:     Head: Normocephalic and atraumatic.     Right Ear: External ear normal.     Left Ear: External ear normal.  Eyes:     Extraocular Movements: Extraocular movements intact.     Pupils: Pupils are equal, round, and reactive to light.  Cardiovascular:     Rate and Rhythm: Normal rate and regular rhythm.     Pulses: Normal pulses.     Heart sounds: Normal heart sounds. No murmur heard.   No gallop.  Pulmonary:     Effort: Pulmonary effort is normal. No respiratory distress.     Breath sounds: Normal breath sounds. No wheezing, rhonchi or rales.  Abdominal:     General: Bowel sounds are normal. There is no distension.     Palpations: Abdomen is soft. There is no mass.     Tenderness: There is no abdominal tenderness. There is no guarding or rebound.     Hernia: No hernia is present.  Musculoskeletal:     Cervical back: Normal range of motion and neck supple.  Lymphadenopathy:     Cervical: No cervical adenopathy.  Skin:    General: Skin is warm and dry.  Neurological:     Mental Status: She is alert and oriented to person, place, and time.  Psychiatric:        Behavior: Behavior normal.    BP 132/78 (BP Location: Right Arm, Patient Position: Sitting, Cuff Size:  Large)   Pulse 71   Temp 98.5 F (36.9 C) (Oral)   Resp 16   Ht '5\' 6"'$  (1.676 m)   Wt 209 lb (94.8 kg)   SpO2 98%  BMI 33.73 kg/m  Wt Readings from Last 3 Encounters:  12/14/20 209 lb (94.8 kg)  10/13/20 207 lb 3.2 oz (94 kg)  03/20/20 201 lb 9.6 oz (91.4 kg)    Diabetic Foot Exam - Simple   No data filed    Lab Results  Component Value Date   WBC 9.1 10/13/2020   HGB 13.2 10/13/2020   HCT 39.7 10/13/2020   PLT 292.0 10/13/2020   GLUCOSE 88 10/13/2020   CHOL 169 10/13/2020   TRIG 128.0 10/13/2020   HDL 50.90 10/13/2020   LDLCALC 92 10/13/2020   ALT 16 10/13/2020   AST 16 10/13/2020   NA 141 10/13/2020   K 4.7 10/13/2020   CL 105 10/13/2020   CREATININE 0.87 10/13/2020   BUN 12 10/13/2020   CO2 27 10/13/2020   TSH 3.52 10/13/2020   HGBA1C 6.0 03/10/2017   MICROALBUR 0.4 07/16/2012    Lab Results  Component Value Date   TSH 3.52 10/13/2020   Lab Results  Component Value Date   WBC 9.1 10/13/2020   HGB 13.2 10/13/2020   HCT 39.7 10/13/2020   MCV 88.1 10/13/2020   PLT 292.0 10/13/2020   Lab Results  Component Value Date   NA 141 10/13/2020   K 4.7 10/13/2020   CO2 27 10/13/2020   GLUCOSE 88 10/13/2020   BUN 12 10/13/2020   CREATININE 0.87 10/13/2020   BILITOT 0.7 10/13/2020   ALKPHOS 84 10/13/2020   AST 16 10/13/2020   ALT 16 10/13/2020   PROT 7.2 10/13/2020   ALBUMIN 4.7 10/13/2020   CALCIUM 10.0 10/13/2020   ANIONGAP 9 09/10/2015   GFR 71.54 10/13/2020   Lab Results  Component Value Date   CHOL 169 10/13/2020   Lab Results  Component Value Date   HDL 50.90 10/13/2020   Lab Results  Component Value Date   LDLCALC 92 10/13/2020   Lab Results  Component Value Date   TRIG 128.0 10/13/2020   Lab Results  Component Value Date   CHOLHDL 3 10/13/2020   Lab Results  Component Value Date   HGBA1C 6.0 03/10/2017       Assessment & Plan:   Problem List Items Addressed This Visit       Unprioritized   Hypothyroidism - Primary    Relevant Medications   metoprolol succinate (TOPROL-XL) 25 MG 24 hr tablet   Other Relevant Orders   TSH   Essential hypertension    Well controlled, no changes to meds. Encouraged heart healthy diet such as the DASH diet and exercise as tolerated.        Relevant Medications   metoprolol succinate (TOPROL-XL) 25 MG 24 hr tablet   Hypothyroid    Check labs con't synthroid       Relevant Medications   metoprolol succinate (TOPROL-XL) 25 MG 24 hr tablet   Other Relevant Orders   TSH   Palpitations    con'trolled        Meds ordered this encounter  Medications   metoprolol succinate (TOPROL-XL) 25 MG 24 hr tablet    Sig: Take 1 tablet (25 mg total) by mouth daily.    Dispense:  90 tablet    Refill:  3    I,Norma Kelley,acting as a scribe for [employer], DO.,have documented all relevant documentation on the behalf of Ann Held, DO,as directed by  Ann Held, DO while in the presence of Ann Held, DO.   I, Etter Sjogren  Koren Shiver DO., personally preformed the services described in this documentation.  All medical record entries made by the scribe were at my direction and in my presence.  I have reviewed the chart and discharge instructions (if applicable) and agree that the record reflects my personal performance and is accurate and complete. 12/14/2020

## 2020-12-15 ENCOUNTER — Encounter: Payer: Self-pay | Admitting: Family Medicine

## 2020-12-15 LAB — TSH: TSH: 0.28 u[IU]/mL — ABNORMAL LOW (ref 0.35–5.50)

## 2020-12-15 NOTE — Assessment & Plan Note (Signed)
Well controlled, no changes to meds. Encouraged heart healthy diet such as the DASH diet and exercise as tolerated.  °

## 2020-12-15 NOTE — Assessment & Plan Note (Signed)
Check labs con't synthroid 

## 2020-12-15 NOTE — Assessment & Plan Note (Signed)
controlled 

## 2020-12-20 NOTE — Progress Notes (Deleted)
HPI: FU palpitations. Monitor March 2021 showed sinus rhythm with frequent PVCs, PACs and one short run of SVT.  Echocardiogram July 2021 showed normal LV function.  Calcium score September 2021 0.  Exercise treadmill September 2021 showed no ST changes and no significant arrhythmias (felt to be low risk).  Since last seen  Current Outpatient Medications  Medication Sig Dispense Refill   ALPRAZolam (XANAX) 0.25 MG tablet TAKE 1 TABLET BY MOUTH THREE TIMES DAILY AS NEEDED 60 tablet 1   levothyroxine (SYNTHROID) 112 MCG tablet TAKE 1 TABLET(100 MCG) BY MOUTH DAILY 90 tablet 0   metoprolol succinate (TOPROL-XL) 25 MG 24 hr tablet Take 1 tablet (25 mg total) by mouth daily. 90 tablet 3   Omeprazole 20 MG TBEC Take 1 tablet by mouth daily.     valACYclovir (VALTREX) 500 MG tablet Take 1 tablet (500 mg total) by mouth daily as needed. 90 tablet 3   No current facility-administered medications for this visit.     Past Medical History:  Diagnosis Date   Allergy    seasonal   Anxiety    Arthritis    fingers   Breast cancer ([place]) 2007   left breast cancer   Depression    Diet-controlled diabetes mellitus ([place])    pt. 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   BREAST LUMPECTOMY WITH RADIOACTIVE SEED LOCALIZATION Right 08/17/2015   Procedure: BREAST LUMPECTOMY WITH RADIOACTIVE SEED LOCALIZATION;  Surgeon: Excell Seltzer, MD;  Location: [place];  Service: General;  Laterality: Right;   BREAST SURGERY     Mastectomy and reconstruction   COLONOSCOPY     MASTECTOMY Left 2007   mastectomy and tamoxifen   POLYPECTOMY     TONSILLECTOMY      Social History   Socioeconomic History   Marital status: Married    Spouse name: Not on file   Number of  children: 3   Years of education: Not on file   Highest education level: Not on file  Occupational History    Employer: [employer]  Tobacco Use   Smoking status: Former    Packs/day: 1.00    Years: 20.00    Pack years: 20.00    Types: Cigarettes   Smokeless tobacco: Never   Tobacco comments:    Quit 14 years ago  Vaping Use   Vaping Use: Never used  Substance and Sexual Activity   Alcohol use: Yes    Comment: Occasional   Drug use: No   Sexual activity: Yes  Other Topics Concern   Not on file  Social History Narrative   Lives with husband.  Occasional exercise.   Social Determinants of Health   Financial Resource Strain: Not on file  Food Insecurity: Not on file  Transportation Needs: Not on file  Physical Activity: Not on file  Stress: Not on file  Social Connections: Not on file  Intimate Partner Violence: Not on file    Family History  Problem Relation Age of Onset   Hyperlipidemia Mother    Osteoporosis Mother    Diabetes Mother    Heart attack Mother 59   Breast cancer Mother 40   Heart attack Father 73  MASSIVE MI,  Died age 57   Colon polyps Father    Colon cancer Maternal Grandfather        dx in late 40s-early 70s   Head & neck cancer Maternal Grandmother        cancer of the sinuses   Pancreatic cancer Neg Hx    Stomach cancer Neg Hx    Rectal cancer Neg Hx    Esophageal cancer Neg Hx     ROS: no fevers or chills, productive cough, hemoptysis, dysphasia, odynophagia, melena, hematochezia, dysuria, hematuria, rash, seizure activity, orthopnea, PND, pedal edema, claudication. Remaining systems are negative.  Physical Exam: Well-developed well-nourished in no acute distress.  Skin is warm and dry.  HEENT is normal.  Neck is supple.  Chest is clear to auscultation with normal expansion.  Cardiovascular exam is regular rate and rhythm.  Abdominal exam nontender or distended. No masses palpated. Extremities show no edema. neuro grossly  intact  ECG- personally reviewed  A/P  1 palpitations-previous monitor showed PACs, PVCs and short burst of SVT.  Symptoms are reasonly well controlled.  Continue beta-blocker at present dose.  2 family history of coronary artery disease-recent calcium score 0.  3 hypertension-patient's blood pressure is controlled.  Continue present medications.  Kirk Ruths, MD

## 2020-12-22 ENCOUNTER — Other Ambulatory Visit: Payer: Self-pay

## 2020-12-22 DIAGNOSIS — E039 Hypothyroidism, unspecified: Secondary | ICD-10-CM

## 2020-12-22 MED ORDER — LEVOTHYROXINE SODIUM 112 MCG PO TABS: ORAL_TABLET | ORAL | 0 refills | Status: DC

## 2020-12-22 MED ORDER — LEVOTHYROXINE SODIUM 100 MCG PO TABS: ORAL_TABLET | ORAL | 3 refills | Status: DC

## 2020-12-30 ENCOUNTER — Ambulatory Visit: Payer: BC Managed Care – PPO | Admitting: Cardiology

## 2021-02-08 ENCOUNTER — Other Ambulatory Visit: Payer: Self-pay | Admitting: Family Medicine

## 2021-02-08 DIAGNOSIS — F419 Anxiety disorder, unspecified: Secondary | ICD-10-CM

## 2021-02-08 NOTE — Telephone Encounter (Signed)
Requesting: Xanax Contract: 2020 UDS: 2020 Last OV: 12/14/20 Next OV: 03/16/21 Last Refill: 12/02/20, #60--1 RF Database:   Please advise

## 2021-02-24 ENCOUNTER — Encounter: Payer: Self-pay | Admitting: Family Medicine

## 2021-03-01 ENCOUNTER — Encounter: Payer: Self-pay | Admitting: Family Medicine

## 2021-03-01 ENCOUNTER — Other Ambulatory Visit: Payer: Self-pay

## 2021-03-01 ENCOUNTER — Ambulatory Visit: Payer: BC Managed Care – PPO | Admitting: Family Medicine

## 2021-03-01 VITALS — BP 138/80 | HR 74 | Temp 99.0°F | Resp 18 | Ht 65.0 in | Wt 213.6 lb

## 2021-03-01 DIAGNOSIS — I10 Essential (primary) hypertension: Secondary | ICD-10-CM | POA: Diagnosis not present

## 2021-03-01 DIAGNOSIS — E039 Hypothyroidism, unspecified: Secondary | ICD-10-CM

## 2021-03-01 MED ORDER — HYDROCHLOROTHIAZIDE 25 MG PO TABS: 25.0000 mg | ORAL_TABLET | Freq: Every day | ORAL | 3 refills | Status: DC

## 2021-03-01 MED ORDER — METOPROLOL SUCCINATE ER 50 MG PO TB24: 50.0000 mg | ORAL_TABLET | Freq: Every day | ORAL | 1 refills | Status: DC

## 2021-03-01 NOTE — Assessment & Plan Note (Signed)
Recheck labs  con't synthroid

## 2021-03-01 NOTE — Assessment & Plan Note (Signed)
Poorly controlled will alter medications, encouraged DASH diet, minimize caffeine and obtain adequate sleep. Report concerning symptoms and follow up as directed and as needed con't metoprolol 50 mg daily and add hctz 25 mg daily Check labs

## 2021-03-01 NOTE — Progress Notes (Signed)
Established Patient Office Visit  Subjective:  Patient ID: Norma Kelley, female    DOB: 10/10/1960  Age: 60 y.o. MRN: [id number]  CC:  Chief Complaint  Patient presents with   Hypertension    Pt states bps have running high. Pt states bps at home have been ranging from 144/45 to 123/80, Pt states she was headaches.    Follow-up    HPI Norma Kelley presents for bp check.    They have been running high again so she went back on the 50 mg metoprolol but it has not gotten better    the headaches are better but she does sometimes feel dizzy.  She needs to have her thyroid rechecked as well.   No other complaints.  No cp, no sob Past Medical History:  Diagnosis Date   Allergy    seasonal   Anxiety    Arthritis    fingers   Breast cancer ([place]) 2007   left breast cancer   Depression    Diet-controlled diabetes mellitus ([place])    pt. 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   BREAST LUMPECTOMY WITH RADIOACTIVE SEED LOCALIZATION Right 08/17/2015   Procedure: BREAST LUMPECTOMY WITH RADIOACTIVE SEED LOCALIZATION;  Surgeon: Excell Seltzer, MD;  Location: [place];  Service: General;  Laterality: Right;   BREAST SURGERY     Mastectomy and reconstruction   COLONOSCOPY     MASTECTOMY Left 2007   mastectomy and tamoxifen   POLYPECTOMY     TONSILLECTOMY      Family History  Problem Relation Age of Onset   Hyperlipidemia Mother    Osteoporosis Mother    Diabetes Mother    Heart attack Mother 78   Breast cancer Mother 17   Heart attack Father 33       MASSIVE MI,  Died age 51   Colon polyps Father    Colon cancer Maternal Grandfather        dx in late 60s-early 70s   Head & neck cancer Maternal Grandmother        cancer of the sinuses    Pancreatic cancer Neg Hx    Stomach cancer Neg Hx    Rectal cancer Neg Hx    Esophageal cancer Neg Hx     Social History   Socioeconomic History   Marital status: Married    Spouse name: Not on file   Number of children: 3   Years of education: Not on file   Highest education level: Not on file  Occupational History    Employer: [employer]  Tobacco Use   Smoking status: Former    Packs/day: 1.00    Years: 20.00    Pack years: 20.00    Types: Cigarettes   Smokeless tobacco: Never   Tobacco comments:    Quit 14 years ago  Vaping Use   Vaping Use: Never used  Substance and Sexual Activity   Alcohol use: Yes    Comment: Occasional   Drug use: No   Sexual activity: Yes  Other Topics Concern   Not on file  Social History Narrative   Lives with husband.  Occasional exercise.   Social Determinants of Health   Financial  Resource Strain: Not on file  Food Insecurity: Not on file  Transportation Needs: Not on file  Physical Activity: Not on file  Stress: Not on file  Social Connections: Not on file  Intimate Partner Violence: Not on file    Outpatient Medications Prior to Visit  Medication Sig Dispense Refill   ALPRAZolam (XANAX) 0.25 MG tablet TAKE 1 TABLET BY MOUTH THREE TIMES DAILY AS NEEDED 60 tablet 0   levothyroxine (SYNTHROID) 100 MCG tablet Take 1 tab by mouth on MWF. 90 tablet 3   levothyroxine (SYNTHROID) 112 MCG tablet TAKE 1 TABLET(100 MCG) BY MOUTH ON TUESDAY, Thursday, Saturday, AND SUNDAYS. 90 tablet 0   Omeprazole 20 MG TBEC Take 1 tablet by mouth daily.     valACYclovir (VALTREX) 500 MG tablet Take 1 tablet (500 mg total) by mouth daily as needed. 90 tablet 3   metoprolol succinate (TOPROL-XL) 50 MG 24 hr tablet Take 50 mg by mouth daily.     metoprolol succinate (TOPROL-XL) 25 MG 24 hr tablet Take 1 tablet (25 mg total) by mouth daily. (Patient not taking: Reported on 03/01/2021) 90 tablet 3   No facility-administered medications prior to visit.     Allergies  Allergen Reactions   Sulfamethoxazole Itching    Swelling around eyes   Sulfonamide Derivatives     ROS Review of Systems  Constitutional:  Negative for chills and fever.  HENT:  Negative for congestion and hearing loss.   Eyes:  Negative for discharge.  Respiratory:  Negative for cough and shortness of breath.   Cardiovascular:  Negative for chest pain, palpitations and leg swelling.  Gastrointestinal:  Negative for abdominal pain, blood in stool, constipation, diarrhea, nausea and vomiting.  Genitourinary:  Negative for dysuria, frequency, hematuria and urgency.  Musculoskeletal:  Negative for back pain and myalgias.  Skin:  Negative for rash.  Allergic/Immunologic: Negative for environmental allergies.  Neurological:  Positive for light-headedness and headaches. Negative for dizziness and weakness.  Hematological:  Does not bruise/bleed easily.  Psychiatric/Behavioral:  Negative for suicidal ideas. The patient is not nervous/anxious.      Objective:    Physical Exam Vitals and nursing note reviewed.  Constitutional:      Appearance: She is well-developed.  HENT:     Head: Normocephalic and atraumatic.  Eyes:     Conjunctiva/sclera: Conjunctivae normal.  Neck:     Thyroid: No thyromegaly.     Vascular: No carotid bruit or JVD.  Cardiovascular:     Rate and Rhythm: Normal rate and regular rhythm.     Heart sounds: Normal heart sounds. No murmur heard. Pulmonary:     Effort: Pulmonary effort is normal. No respiratory distress.     Breath sounds: Normal breath sounds. No wheezing or rales.  Chest:     Chest wall: No tenderness.  Musculoskeletal:     Cervical back: Normal range of motion and neck supple.  Neurological:     Mental Status: She is alert and oriented to person, place, and time.    BP 138/80 (BP Location: Left Arm, Patient Position: Sitting, Cuff Size: Large)   Pulse 74   Temp 99 F (37.2 C) (Oral)   Resp 18   Ht 5\' 5"  (1.651 m)   Wt  213 lb 9.6 oz (96.9 kg)   SpO2 99%   BMI 35.54 kg/m  Wt Readings from Last 3 Encounters:  03/01/21 213 lb 9.6 oz (96.9 kg)  12/14/20 209 lb (94.8 kg)  10/13/20 207 lb 3.2 oz (  94 kg)     Health Maintenance Due  Topic Date Due   Pneumococcal Vaccine 4-58 Years old (1 - PCV) Never done   COVID-19 Vaccine (3 - Booster for Janssen series) 05/05/2020    There are no preventive care reminders to display for this patient.  Lab Results  Component Value Date   TSH 0.28 (L) 12/14/2020   Lab Results  Component Value Date   WBC 9.1 10/13/2020   HGB 13.2 10/13/2020   HCT 39.7 10/13/2020   MCV 88.1 10/13/2020   PLT 292.0 10/13/2020   Lab Results  Component Value Date   NA 141 10/13/2020   K 4.7 10/13/2020   CO2 27 10/13/2020   GLUCOSE 88 10/13/2020   BUN 12 10/13/2020   CREATININE 0.87 10/13/2020   BILITOT 0.7 10/13/2020   ALKPHOS 84 10/13/2020   AST 16 10/13/2020   ALT 16 10/13/2020   PROT 7.2 10/13/2020   ALBUMIN 4.7 10/13/2020   CALCIUM 10.0 10/13/2020   ANIONGAP 9 09/10/2015   GFR 71.54 10/13/2020   Lab Results  Component Value Date   CHOL 169 10/13/2020   Lab Results  Component Value Date   HDL 50.90 10/13/2020   Lab Results  Component Value Date   LDLCALC 92 10/13/2020   Lab Results  Component Value Date   TRIG 128.0 10/13/2020   Lab Results  Component Value Date   CHOLHDL 3 10/13/2020   Lab Results  Component Value Date   HGBA1C 6.0 03/10/2017      Assessment & Plan:   Problem List Items Addressed This Visit       Unprioritized   Essential hypertension    Poorly controlled will alter medications, encouraged DASH diet, minimize caffeine and obtain adequate sleep. Report concerning symptoms and follow up as directed and as needed con't metoprolol 50 mg daily and add hctz 25 mg daily Check labs       Relevant Medications   metoprolol succinate (TOPROL-XL) 50 MG 24 hr tablet   hydrochlorothiazide (HYDRODIURIL) 25 MG tablet    Hypothyroidism    Recheck labs  con't synthroid       Relevant Medications   metoprolol succinate (TOPROL-XL) 50 MG 24 hr tablet   Other Relevant Orders   Thyroid Panel With TSH   Other Visit Diagnoses     Primary hypertension    -  Primary   Relevant Medications   metoprolol succinate (TOPROL-XL) 50 MG 24 hr tablet   hydrochlorothiazide (HYDRODIURIL) 25 MG tablet   Other Relevant Orders   Comprehensive metabolic panel       Meds ordered this encounter  Medications   metoprolol succinate (TOPROL-XL) 50 MG 24 hr tablet    Sig: Take 1 tablet (50 mg total) by mouth daily.    Dispense:  90 tablet    Refill:  1   hydrochlorothiazide (HYDRODIURIL) 25 MG tablet    Sig: Take 1 tablet (25 mg total) by mouth daily.    Dispense:  90 tablet    Refill:  3    Follow-up: Return in about 2 weeks (around 03/15/2021), or if symptoms worsen or fail to improve, for hypertension.    Ann Held, DO

## 2021-03-01 NOTE — Patient Instructions (Signed)

## 2021-03-02 LAB — THYROID PANEL WITH TSH
Free Thyroxine Index: 2.7 (ref 1.4–3.8)
T3 Uptake: 28 % (ref 22–35)
T4, Total: 9.5 ug/dL (ref 5.1–11.9)
TSH: 1.31 mIU/L (ref 0.40–4.50)

## 2021-03-02 LAB — COMPREHENSIVE METABOLIC PANEL
ALT: 19 U/L (ref 0–35)
AST: 18 U/L (ref 0–37)
Albumin: 4.7 g/dL (ref 3.5–5.2)
Alkaline Phosphatase: 90 U/L (ref 39–117)
BUN: 15 mg/dL (ref 6–23)
CO2: 28 mEq/L (ref 19–32)
Calcium: 9.8 mg/dL (ref 8.4–10.5)
Chloride: 103 mEq/L (ref 96–112)
Creatinine, Ser: 0.81 mg/dL (ref 0.40–1.20)
GFR: 77.74 mL/min (ref >60.00)
Glucose, Bld: 91 mg/dL (ref 70–99)
Potassium: 4.2 mEq/L (ref 3.5–5.1)
Sodium: 138 mEq/L (ref 135–145)
Total Bilirubin: 0.6 mg/dL (ref 0.2–1.2)
Total Protein: 7.3 g/dL (ref 6.0–8.3)

## 2021-03-09 ENCOUNTER — Other Ambulatory Visit: Payer: Self-pay | Admitting: Family Medicine

## 2021-03-09 DIAGNOSIS — F32A Depression, unspecified: Secondary | ICD-10-CM

## 2021-03-09 DIAGNOSIS — F419 Anxiety disorder, unspecified: Secondary | ICD-10-CM

## 2021-03-09 NOTE — Telephone Encounter (Signed)
Requesting: Alprazolam 0.25mg  Contract: 2020 UDS: 2020 Last Visit: 03/01/2021 Next Visit: 03/16/2021 Last Refill: 02/08/2021, #60, 0RF  Please Advise

## 2021-03-16 ENCOUNTER — Ambulatory Visit: Payer: BC Managed Care – PPO | Admitting: Family Medicine

## 2021-03-16 ENCOUNTER — Encounter: Payer: Self-pay | Admitting: Family Medicine

## 2021-03-16 VITALS — BP 110/80 | HR 66 | Temp 99.1°F | Resp 18 | Ht 65.0 in | Wt 209.4 lb

## 2021-03-16 DIAGNOSIS — I10 Essential (primary) hypertension: Secondary | ICD-10-CM

## 2021-03-16 NOTE — Progress Notes (Signed)
Subjective:   By signing my name below, I, Norma Kelley, attest that this documentation has been prepared under the direction and in the presence of Dr. Roma Schanz, DO. 03/16/2021    Patient ID: Norma Kelley, female    DOB: [birth year], 60 y.o.   MRN: [id number]  Chief Complaint  Patient presents with   Hypertension   Follow-up    Hypertension Pertinent negatives include no blurred vision, chest pain, headaches, malaise/fatigue, palpitations or shortness of breath.  Patient is in today for a office visit.   Hypertension- Her blood pressure is doing well during this visit. She continues taking 25 mg hydrochlorothiazide daily PO, 50 mg metoprolol succinate daily PO and reports no new issues while taking them.   BP Readings from Last 3 Encounters:  03/16/21 110/80  03/01/21 138/80  12/14/20 132/78   Pulse Readings from Last 3 Encounters:  03/16/21 66  03/01/21 74  12/14/20 71   Synthroid- She reports only taking 100 mg synthroid daily PO every day. When taking 100 and 112 mg synthroid on alternating days her thyroid levels dropped. She reports no new issues while taking 100 mg synthroid daily PO. Immunizations- She has the Covid-19 vaccines. She is informed of the bivalent Covid-19 booster vaccine. She is UTD on flu vaccines this year.    Past Medical History:  Diagnosis Date   Allergy    seasonal   Anxiety    Arthritis    fingers   Breast cancer ([place]) 2007   left breast cancer   Depression    Diet-controlled diabetes mellitus ([place])    pt. 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   BREAST LUMPECTOMY WITH RADIOACTIVE SEED LOCALIZATION Right 08/17/2015   Procedure: BREAST LUMPECTOMY WITH RADIOACTIVE SEED LOCALIZATION;  Surgeon: Excell Seltzer, MD;  Location: [facility];  Service: General;  Laterality: Right;   BREAST SURGERY     Mastectomy and reconstruction   COLONOSCOPY     MASTECTOMY Left 2007   mastectomy and tamoxifen   POLYPECTOMY     TONSILLECTOMY      Family History  Problem Relation Age of Onset   Hyperlipidemia Mother    Osteoporosis Mother    Diabetes Mother    Heart attack Mother 8   Breast cancer Mother 53   Heart attack Father 18       MASSIVE MI,  Died age 64   Colon polyps Father    Colon cancer Maternal Grandfather        dx in late 60s-early 70s   Head & neck cancer Maternal Grandmother        cancer of the sinuses   Pancreatic cancer Neg Hx    Stomach cancer Neg Hx    Rectal cancer Neg Hx    Esophageal cancer Neg Hx     Social History   Socioeconomic History   Marital status: Married    Spouse name: Not on file   Number of children: 3   Years of education: Not on file   Highest education level: Not on file  Occupational History    Employer: [employer]  Tobacco Use   Smoking status: Former    Packs/day: 1.00    Years:  20.00    Pack years: 20.00    Types: Cigarettes   Smokeless tobacco: Never   Tobacco comments:    Quit 14 years ago  Vaping Use   Vaping Use: Never used  Substance and Sexual Activity   Alcohol use: Yes    Comment: Occasional   Drug use: No   Sexual activity: Yes  Other Topics Concern   Not on file  Social History Narrative   Lives with husband.  Occasional exercise.   Social Determinants of Health   Financial Resource Strain: Not on file  Food Insecurity: Not on file  Transportation Needs: Not on file  Physical Activity: Not on file  Stress: Not on file  Social Connections: Not on file  Intimate Partner Violence: Not on file    Outpatient Medications Prior to Visit  Medication Sig Dispense Refill   ALPRAZolam (XANAX) 0.25 MG tablet TAKE 1 TABLET BY MOUTH THREE TIMES DAILY AS NEEDED 60 tablet 0   hydrochlorothiazide  (HYDRODIURIL) 25 MG tablet Take 1 tablet (25 mg total) by mouth daily. 90 tablet 3   levothyroxine (SYNTHROID) 100 MCG tablet Take 1 tab by mouth on MWF. 90 tablet 3   metoprolol succinate (TOPROL-XL) 50 MG 24 hr tablet Take 1 tablet (50 mg total) by mouth daily. 90 tablet 1   Omeprazole 20 MG TBEC Take 1 tablet by mouth daily.     valACYclovir (VALTREX) 500 MG tablet Take 1 tablet (500 mg total) by mouth daily as needed. 90 tablet 3   levothyroxine (SYNTHROID) 112 MCG tablet TAKE 1 TABLET(100 MCG) BY MOUTH ON TUESDAY, Thursday, Saturday, AND SUNDAYS. 90 tablet 0   No facility-administered medications prior to visit.    Allergies  Allergen Reactions   Sulfamethoxazole Itching    Swelling around eyes   Sulfonamide Derivatives     Review of Systems  Constitutional:  Negative for fever and malaise/fatigue.  HENT:  Negative for congestion.   Eyes:  Negative for blurred vision.  Respiratory:  Negative for shortness of breath.   Cardiovascular:  Negative for chest pain, palpitations and leg swelling.  Gastrointestinal:  Negative for abdominal pain, blood in stool and nausea.  Genitourinary:  Negative for dysuria and frequency.  Musculoskeletal:  Negative for falls.  Skin:  Negative for rash.  Neurological:  Negative for dizziness, loss of consciousness and headaches.  Endo/Heme/Allergies:  Negative for environmental allergies.  Psychiatric/Behavioral:  Negative for depression. The patient is not nervous/anxious.       Objective:    Physical Exam Vitals and nursing note reviewed.  Constitutional:      General: She is not in acute distress.    Appearance: Normal appearance. She is not ill-appearing.  HENT:     Head: Normocephalic and atraumatic.     Right Ear: External ear normal.     Left Ear: External ear normal.  Eyes:     Extraocular Movements: Extraocular movements intact.     Pupils: Pupils are equal, round, and reactive to light.  Cardiovascular:     Rate and Rhythm:  Normal rate and regular rhythm.     Heart sounds: Normal heart sounds. No murmur heard.   No gallop.  Pulmonary:     Effort: Pulmonary effort is normal. No respiratory distress.     Breath sounds: Normal breath sounds. No wheezing or rales.  Skin:    General: Skin is warm and dry.  Neurological:     Mental Status: She is alert and oriented to person, place, and  time.  Psychiatric:        Behavior: Behavior normal.        Judgment: Judgment normal.    BP 110/80 (BP Location: Left Arm, Patient Position: Sitting, Cuff Size: Large)   Pulse 66   Temp 99.1 F (37.3 C) (Oral)   Resp 18   Ht 5\' 5"  (1.651 m)   Wt 209 lb 6.4 oz (95 kg)   SpO2 98%   BMI 34.85 kg/m  Wt Readings from Last 3 Encounters:  03/16/21 209 lb 6.4 oz (95 kg)  03/01/21 213 lb 9.6 oz (96.9 kg)  12/14/20 209 lb (94.8 kg)    Diabetic Foot Exam - Simple   No data filed    Lab Results  Component Value Date   WBC 9.1 10/13/2020   HGB 13.2 10/13/2020   HCT 39.7 10/13/2020   PLT 292.0 10/13/2020   GLUCOSE 91 03/01/2021   CHOL 169 10/13/2020   TRIG 128.0 10/13/2020   HDL 50.90 10/13/2020   LDLCALC 92 10/13/2020   ALT 19 03/01/2021   AST 18 03/01/2021   NA 138 03/01/2021   K 4.2 03/01/2021   CL 103 03/01/2021   CREATININE 0.81 03/01/2021   BUN 15 03/01/2021   CO2 28 03/01/2021   TSH 1.31 03/01/2021   HGBA1C 6.0 03/10/2017   MICROALBUR 0.4 07/16/2012    Lab Results  Component Value Date   TSH 1.31 03/01/2021   Lab Results  Component Value Date   WBC 9.1 10/13/2020   HGB 13.2 10/13/2020   HCT 39.7 10/13/2020   MCV 88.1 10/13/2020   PLT 292.0 10/13/2020   Lab Results  Component Value Date   NA 138 03/01/2021   K 4.2 03/01/2021   CO2 28 03/01/2021   GLUCOSE 91 03/01/2021   BUN 15 03/01/2021   CREATININE 0.81 03/01/2021   BILITOT 0.6 03/01/2021   ALKPHOS 90 03/01/2021   AST 18 03/01/2021   ALT 19 03/01/2021   PROT 7.3 03/01/2021   ALBUMIN 4.7 03/01/2021   CALCIUM 9.8 03/01/2021    ANIONGAP 9 09/10/2015   GFR 77.74 03/01/2021   Lab Results  Component Value Date   CHOL 169 10/13/2020   Lab Results  Component Value Date   HDL 50.90 10/13/2020   Lab Results  Component Value Date   LDLCALC 92 10/13/2020   Lab Results  Component Value Date   TRIG 128.0 10/13/2020   Lab Results  Component Value Date   CHOLHDL 3 10/13/2020   Lab Results  Component Value Date   HGBA1C 6.0 03/10/2017       Assessment & Plan:   Problem List Items Addressed This Visit       Unprioritized   Essential hypertension    Well controlled, no changes to meds. Encouraged heart healthy diet such as the DASH diet and exercise as tolerated.       Other Visit Diagnoses     Primary hypertension    -  Primary        No orders of the defined types were placed in this encounter.   I, Dr. Roma Schanz, DO, personally preformed the services described in this documentation.  All medical record entries made by the scribe were at my direction and in my presence.  I have reviewed the chart and discharge instructions (if applicable) and agree that the record reflects my personal performance and is accurate and complete. 03/16/2021   I,Norma Kelley,acting as a scribe for [employer], DO.,have documented  all relevant documentation on the behalf of Ann Held, DO,as directed by  Ann Held, DO while in the presence of Ann Held, DO.   Ann Held, DO

## 2021-03-16 NOTE — Patient Instructions (Signed)

## 2021-03-16 NOTE — Assessment & Plan Note (Addendum)
Well controlled, no changes to meds. Encouraged heart healthy diet such as the DASH diet and exercise as tolerated.   con't metoprolol and hctz

## 2021-04-15 ENCOUNTER — Other Ambulatory Visit: Payer: Self-pay | Admitting: Family Medicine

## 2021-04-15 DIAGNOSIS — F419 Anxiety disorder, unspecified: Secondary | ICD-10-CM

## 2021-04-16 NOTE — Telephone Encounter (Signed)
Requesting: alprazolam 0.25mg  Contract: 01/21/2019 UDS: 01/21/2019 Last Visit: 03/16/2021 Next Visit: 09/14/2021 Last Refill: 03/09/2021 #60 and 0RF  Please Advise

## 2021-05-07 ENCOUNTER — Telehealth: Payer: Self-pay | Admitting: Family Medicine

## 2021-05-07 MED ORDER — LEVOTHYROXINE SODIUM 100 MCG PO TABS: ORAL_TABLET | ORAL | 1 refills | Status: DC

## 2021-05-07 NOTE — Telephone Encounter (Signed)
Medication: levothyroxine (SYNTHROID) 100 MCG tablet   Has the patient contacted their pharmacy? Yes.   (If no, request that the patient contact the pharmacy for the refill.) (If yes, when and what did the pharmacy advise?)  Preferred Pharmacy (with phone number or street name): [id number] [address]  [place], [place] [address]  Phone:  [phone]  Fax:  [phone]   Agent: Please be advised that RX refills may take up to 3 business days. We ask that you follow-up with your pharmacy.

## 2021-05-07 NOTE — Telephone Encounter (Signed)
Refill sent.

## 2021-05-10 ENCOUNTER — Encounter: Payer: Self-pay | Admitting: Family Medicine

## 2021-05-11 MED ORDER — LEVOTHYROXINE SODIUM 100 MCG PO TABS: ORAL_TABLET | ORAL | 1 refills | Status: DC

## 2021-05-25 NOTE — Progress Notes (Signed)
HPI: FU palpitations.  Monitor March 2021 showed sinus rhythm with frequent PVCs, PACs and one short run of SVT.  Echocardiogram March 2021 showed normal LV function.  Exercise treadmill September 2021 showed no ST changes and no significant arrhythmias with exercise (occasional PAC).  Calcium score 9/21 0; aortic atherosclerosis noted.  Since last seen, she has occasional palpitations described as a skipped.  These are nonsustained and less frequent compared to previous.  She denies dyspnea, chest pain or syncope.  Current Outpatient Medications  Medication Sig Dispense Refill   ALPRAZolam (XANAX) 0.25 MG tablet TAKE 1 TABLET BY MOUTH THREE TIMES DAILY AS NEEDED 60 tablet 1   hydrochlorothiazide (HYDRODIURIL) 25 MG tablet Take 1 tablet (25 mg total) by mouth daily. 90 tablet 3   levothyroxine (SYNTHROID) 100 MCG tablet Take 1 tablet by mouth daily 90 tablet 1   metoprolol succinate (TOPROL-XL) 50 MG 24 hr tablet Take 1 tablet (50 mg total) by mouth daily. 90 tablet 1   Omeprazole 20 MG TBEC Take 1 tablet by mouth daily.     valACYclovir (VALTREX) 500 MG tablet Take 1 tablet (500 mg total) by mouth daily as needed. 90 tablet 3   No current facility-administered medications for this visit.     Past Medical History:  Diagnosis Date   Allergy    seasonal   Anxiety    Arthritis    fingers   Breast cancer ([place]) 2007   left breast cancer   Depression    Diet-controlled diabetes mellitus ([place])    pt. 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   BREAST LUMPECTOMY WITH RADIOACTIVE SEED LOCALIZATION Right 08/17/2015   Procedure: BREAST LUMPECTOMY WITH RADIOACTIVE SEED LOCALIZATION;  Surgeon: Excell Seltzer, MD;  Location: [facility];  Service: General;   Laterality: Right;   BREAST SURGERY     Mastectomy and reconstruction   COLONOSCOPY     MASTECTOMY Left 2007   mastectomy and tamoxifen   POLYPECTOMY     TONSILLECTOMY      Social History   Socioeconomic History   Marital status: Married    Spouse name: Not on file   Number of children: 3   Years of education: Not on file   Highest education level: Not on file  Occupational History    Employer: [employer]  Tobacco Use   Smoking status: Former    Packs/day: 1.00    Years: 20.00    Pack years: 20.00    Types: Cigarettes   Smokeless tobacco: Never   Tobacco comments:    Quit 14 years ago  Vaping Use   Vaping Use: Never used  Substance and Sexual Activity   Alcohol use: Yes    Comment: Occasional   Drug use: No   Sexual activity: Yes  Other Topics Concern   Not on file  Social History Narrative   Lives with husband.  Occasional exercise.   Social Determinants of Health   Financial Resource Strain: Not on file  Food Insecurity: Not on file  Transportation Needs: Not on file  Physical Activity: Not on file  Stress: Not on file  Social Connections: Not on file  Intimate Partner Violence: Not on file  Family History  Problem Relation Age of Onset   Hyperlipidemia Mother    Osteoporosis Mother    Diabetes Mother    Heart attack Mother 63   Breast cancer Mother 4   Heart attack Father 23       MASSIVE MI,  Died age 22   Colon polyps Father    Colon cancer Maternal Grandfather        dx in late 70s-early 70s   Head & neck cancer Maternal Grandmother        cancer of the sinuses   Pancreatic cancer Neg Hx    Stomach cancer Neg Hx    Rectal cancer Neg Hx    Esophageal cancer Neg Hx     ROS: no fevers or chills, productive cough, hemoptysis, dysphasia, odynophagia, melena, hematochezia, dysuria, hematuria, rash, seizure activity, orthopnea, PND, pedal edema, claudication. Remaining systems are negative.  Physical Exam: Well-developed well-nourished  in no acute distress.  Skin is warm and dry.  HEENT is normal.  Neck is supple.  Chest is clear to auscultation with normal expansion.  Cardiovascular exam is regular rate and rhythm.  Abdominal exam nontender or distended. No masses palpated. Extremities show no edema. neuro grossly intact  ECG-normal sinus rhythm at a rate of 68, no ST changes.  Personally reviewed  A/P  1 palpitations-previous evaluation showed normal LV function and no significant arrhythmias with exercise.  Her symptoms are unchanged.  We will continue beta-blocker at present dose.  Can consider monitor for apple watch in the future if needed.  2 hypertension-blood pressure controlled.  Continue present medications and follow.  3 family history of coronary artery disease-patient had a calcium score September 2021 that was 0.  Kirk Ruths, MD

## 2021-06-02 ENCOUNTER — Other Ambulatory Visit: Payer: Self-pay

## 2021-06-02 ENCOUNTER — Encounter: Payer: Self-pay | Admitting: Cardiology

## 2021-06-02 ENCOUNTER — Ambulatory Visit: Payer: BC Managed Care – PPO | Admitting: Cardiology

## 2021-06-02 VITALS — BP 126/80 | HR 68 | Ht 66.0 in | Wt 207.2 lb

## 2021-06-02 DIAGNOSIS — R002 Palpitations: Secondary | ICD-10-CM | POA: Diagnosis not present

## 2021-06-02 DIAGNOSIS — I10 Essential (primary) hypertension: Secondary | ICD-10-CM

## 2021-06-02 DIAGNOSIS — Z8249 Family history of ischemic heart disease and other diseases of the circulatory system: Secondary | ICD-10-CM | POA: Diagnosis not present

## 2021-06-02 NOTE — Patient Instructions (Signed)

## 2021-06-15 ENCOUNTER — Other Ambulatory Visit: Payer: Self-pay | Admitting: Family Medicine

## 2021-06-15 DIAGNOSIS — Z1231 Encounter for screening mammogram for malignant neoplasm of breast: Secondary | ICD-10-CM

## 2021-06-20 ENCOUNTER — Other Ambulatory Visit: Payer: Self-pay | Admitting: Family Medicine

## 2021-06-20 DIAGNOSIS — F419 Anxiety disorder, unspecified: Secondary | ICD-10-CM

## 2021-06-20 DIAGNOSIS — F32A Depression, unspecified: Secondary | ICD-10-CM

## 2021-06-21 NOTE — Telephone Encounter (Signed)
Requesting: alprazolam 0.25mg   Contract: 01/21/2019 UDS: 01/21/2019 Last Visit: 03/16/2021 Next Visit: 09/14/2021 Last Refill: 04/16/2021 #60 and 0RF  Please Advise

## 2021-07-20 ENCOUNTER — Other Ambulatory Visit: Payer: Self-pay | Admitting: Family Medicine

## 2021-07-20 ENCOUNTER — Ambulatory Visit (INDEPENDENT_AMBULATORY_CARE_PROVIDER_SITE_OTHER): Payer: BC Managed Care – PPO | Admitting: Family Medicine

## 2021-07-20 ENCOUNTER — Encounter: Payer: Self-pay | Admitting: Family Medicine

## 2021-07-20 VITALS — BP 118/78 | HR 77 | Temp 98.9°F | Resp 18 | Ht 66.0 in | Wt 207.0 lb

## 2021-07-20 DIAGNOSIS — S0990XA Unspecified injury of head, initial encounter: Secondary | ICD-10-CM | POA: Insufficient documentation

## 2021-07-20 DIAGNOSIS — I10 Essential (primary) hypertension: Secondary | ICD-10-CM

## 2021-07-20 DIAGNOSIS — R42 Dizziness and giddiness: Secondary | ICD-10-CM

## 2021-07-20 DIAGNOSIS — R519 Headache, unspecified: Secondary | ICD-10-CM | POA: Diagnosis not present

## 2021-07-20 DIAGNOSIS — R55 Syncope and collapse: Secondary | ICD-10-CM | POA: Diagnosis not present

## 2021-07-20 DIAGNOSIS — E039 Hypothyroidism, unspecified: Secondary | ICD-10-CM

## 2021-07-20 DIAGNOSIS — F419 Anxiety disorder, unspecified: Secondary | ICD-10-CM

## 2021-07-20 DIAGNOSIS — E890 Postprocedural hypothyroidism: Secondary | ICD-10-CM

## 2021-07-20 DIAGNOSIS — R739 Hyperglycemia, unspecified: Secondary | ICD-10-CM | POA: Diagnosis not present

## 2021-07-20 LAB — POC COVID19 BINAXNOW: SARS Coronavirus 2 Ag: NEGATIVE

## 2021-07-20 LAB — POC INFLUENZA A&B (BINAX/QUICKVUE)
Influenza A, POC: NEGATIVE
Influenza B, POC: NEGATIVE

## 2021-07-20 LAB — POCT URINALYSIS DIPSTICK
Bilirubin, UA: NEGATIVE
Blood, UA: NEGATIVE
Glucose, UA: NEGATIVE
Ketones, UA: NEGATIVE
Leukocytes, UA: NEGATIVE
Nitrite, UA: NEGATIVE
Odor: NEGATIVE
Protein, UA: NEGATIVE
Spec Grav, UA: 1.01 (ref 1.010–1.025)
Urobilinogen, UA: 0.2 E.U./dL
pH, UA: 6 (ref 5.0–8.0)

## 2021-07-20 NOTE — Progress Notes (Signed)
? ?Established Patient Office Visit ? ?Subjective:  ?Patient ID: Norma Kelley, female    DOB: 07-26-1960  Age: 61 y.o. MRN: [id number] ? ?CC:  ?Chief Complaint  ?Patient presents with  ? Loss of Consciousness  ?  Pt states passed out this morning around 3 am and hit her head on the left head and has a bruised shoulder. Pt states she was feeling dizzy before passing out.   ? ? ?HPI ?Norma Kelley presents for episode of dizziness and passed out about 3 am.  She got up to go to the bathroom and passed out   she hit her head on the bath tub and shoulder --- has a headache now  ? ?Past Medical History:  ?Diagnosis Date  ? Allergy   ? seasonal  ? Anxiety   ? Arthritis   ? fingers  ? Breast cancer ([place]) 2007  ? left breast cancer  ? Depression   ? Diet-controlled diabetes mellitus ([place])   ? pt. denies 11/21/18  ? GERD (gastroesophageal reflux disease)   ? Hypothyroidism   ? ? ?Past Surgical History:  ?Procedure Laterality Date  ? ABDOMINAL HYSTERECTOMY    ? BACK SURGERY  2013  ? lumbar  ? BACK SURGERY    ? BREAST EXCISIONAL BIOPSY Right 08/17/2015  ? LCIS  ? BREAST LUMPECTOMY WITH RADIOACTIVE SEED LOCALIZATION Right 08/17/2015  ? Procedure: BREAST LUMPECTOMY WITH RADIOACTIVE SEED LOCALIZATION;  Surgeon: Excell Seltzer, MD;  Location: [facility];  Service: General;  Laterality: Right;  ? BREAST SURGERY    ? Mastectomy and reconstruction  ? COLONOSCOPY    ? MASTECTOMY Left 2007  ? mastectomy and tamoxifen  ? POLYPECTOMY    ? TONSILLECTOMY    ? ? ?Family History  ?Problem Relation Age of Onset  ? Hyperlipidemia Mother   ? Osteoporosis Mother   ? Diabetes Mother   ? Heart attack Mother 39  ? Breast cancer Mother 78  ? Heart attack Father 23  ?     MASSIVE MI,  Died age 46  ? Colon polyps Father   ? Colon cancer Maternal Grandfather   ?     dx in late 60s-early 70s  ? Head & neck cancer Maternal Grandmother   ?     cancer of the sinuses  ? Pancreatic cancer Neg Hx   ? Stomach cancer Neg Hx   ? Rectal cancer  Neg Hx   ? Esophageal cancer Neg Hx   ? ? ?Social History  ? ?Socioeconomic History  ? Marital status: Married  ?  Spouse name: Not on file  ? Number of children: 3  ? Years of education: Not on file  ? Highest education level: Not on file  ?Occupational History  ?  Employer: [employer]  ?Tobacco Use  ? Smoking status: Former  ?  Packs/day: 1.00  ?  Years: 20.00  ?  Pack years: 20.00  ?  Types: Cigarettes  ? Smokeless tobacco: Never  ? Tobacco comments:  ?  Quit 14 years ago  ?Vaping Use  ? Vaping Use: Never used  ?Substance and Sexual Activity  ? Alcohol use: Yes  ?  Comment: Occasional  ? Drug use: No  ? Sexual activity: Yes  ?Other Topics Concern  ? Not on file  ?Social History Narrative  ? Lives with husband.  Occasional exercise.  ? ?Social Determinants of Health  ? ?Financial Resource Strain: Not on file  ?Food Insecurity: Not on file  ?  Transportation Needs: Not on file  ?Physical Activity: Not on file  ?Stress: Not on file  ?Social Connections: Not on file  ?Intimate Partner Violence: Not on file  ? ? ?Outpatient Medications Prior to Visit  ?Medication Sig Dispense Refill  ? ALPRAZolam (XANAX) 0.25 MG tablet TAKE 1 TABLET BY MOUTH THREE TIMES DAILY AS NEEDED 60 tablet 0  ? hydrochlorothiazide (HYDRODIURIL) 25 MG tablet Take 1 tablet (25 mg total) by mouth daily. 90 tablet 3  ? levothyroxine (SYNTHROID) 100 MCG tablet Take 1 tablet by mouth daily 90 tablet 1  ? metoprolol succinate (TOPROL-XL) 50 MG 24 hr tablet Take 1 tablet (50 mg total) by mouth daily. 90 tablet 1  ? Omeprazole 20 MG TBEC Take 1 tablet by mouth daily.    ? valACYclovir (VALTREX) 500 MG tablet Take 1 tablet (500 mg total) by mouth daily as needed. 90 tablet 3  ? ?No facility-administered medications prior to visit.  ? ? ?Allergies  ?Allergen Reactions  ? Sulfamethoxazole Itching  ?  Swelling around eyes  ? Sulfonamide Derivatives   ? ? ?ROS ?Review of Systems  ?Constitutional:  Negative for appetite change, diaphoresis, fatigue and  unexpected weight change.  ?Eyes:  Negative for pain, redness and visual disturbance.  ?Respiratory:  Negative for cough, chest tightness, shortness of breath and wheezing.   ?Cardiovascular:  Negative for chest pain, palpitations and leg swelling.  ?Endocrine: Negative for cold intolerance, heat intolerance, polydipsia, polyphagia and polyuria.  ?Genitourinary:  Negative for difficulty urinating, dysuria and frequency.  ?Neurological:  Negative for dizziness, light-headedness, numbness and headaches.  ? ?  ?Objective:  ?  ?Physical Exam ?Vitals and nursing note reviewed.  ?Constitutional:   ?   Appearance: She is well-developed.  ?HENT:  ?   Head: Normocephalic and atraumatic.  ?Eyes:  ?   Conjunctiva/sclera: Conjunctivae normal.  ?Neck:  ?   Thyroid: No thyromegaly.  ?   Vascular: No carotid bruit or JVD.  ?Cardiovascular:  ?   Rate and Rhythm: Normal rate and regular rhythm.  ?   Heart sounds: Normal heart sounds. No murmur heard. ?Pulmonary:  ?   Effort: Pulmonary effort is normal. No respiratory distress.  ?   Breath sounds: Normal breath sounds. No wheezing or rales.  ?Chest:  ?   Chest wall: No tenderness.  ?Musculoskeletal:  ?   Cervical back: Normal range of motion and neck supple.  ?Neurological:  ?   General: No focal deficit present.  ?   Mental Status: She is alert and oriented to person, place, and time.  ?   Gait: Gait normal.  ? ? ?BP 118/78 (BP Location: Left Arm, Patient Position: Sitting, Cuff Size: Large)   Pulse 77   Temp 98.9 ?F (37.2 ?C) (Oral)   Resp 18   Ht '5\' 6"'$  (1.676 m)   Wt 207 lb (93.9 kg)   SpO2 99%   BMI 33.41 kg/m?  ?Wt Readings from Last 3 Encounters:  ?07/20/21 207 lb (93.9 kg)  ?06/02/21 207 lb 3.2 oz (94 kg)  ?03/16/21 209 lb 6.4 oz (95 kg)  ? ? ? ?Health Maintenance Due  ?Topic Date Due  ? COVID-19 Vaccine (3 - Booster for Janssen series) 05/05/2020  ? MAMMOGRAM  07/20/2021  ? ? ?There are no preventive care reminders to display for this patient. ? ?Lab Results   ?Component Value Date  ? TSH 1.31 03/01/2021  ? ?Lab Results  ?Component Value Date  ? WBC 9.1 10/13/2020  ? HGB  13.2 10/13/2020  ? HCT 39.7 10/13/2020  ? MCV 88.1 10/13/2020  ? PLT 292.0 10/13/2020  ? ?Lab Results  ?Component Value Date  ? NA 138 03/01/2021  ? K 4.2 03/01/2021  ? CO2 28 03/01/2021  ? GLUCOSE 91 03/01/2021  ? BUN 15 03/01/2021  ? CREATININE 0.81 03/01/2021  ? BILITOT 0.6 03/01/2021  ? ALKPHOS 90 03/01/2021  ? AST 18 03/01/2021  ? ALT 19 03/01/2021  ? PROT 7.3 03/01/2021  ? ALBUMIN 4.7 03/01/2021  ? CALCIUM 9.8 03/01/2021  ? ANIONGAP 9 09/10/2015  ? GFR 77.74 03/01/2021  ? ?Lab Results  ?Component Value Date  ? CHOL 169 10/13/2020  ? ?Lab Results  ?Component Value Date  ? HDL 50.90 10/13/2020  ? ?Lab Results  ?Component Value Date  ? [place] 92 10/13/2020  ? ?Lab Results  ?Component Value Date  ? TRIG 128.0 10/13/2020  ? ?Lab Results  ?Component Value Date  ? CHOLHDL 3 10/13/2020  ? ?Lab Results  ?Component Value Date  ? HGBA1C 6.0 03/10/2017  ? ? ?  ?Assessment & Plan:  ? ?Problem List Items Addressed This Visit   ? ?  ? Unprioritized  ? Hypothyroidism  ? Relevant Orders  ? CBC with Differential/Platelet  ? Comprehensive metabolic panel  ? Hemoglobin A1c  ? Lipid panel  ? POCT urinalysis dipstick (Completed)  ? TSH  ? Vitamin B12  ? Dizziness  ? Relevant Orders  ? CBC with Differential/Platelet  ? Comprehensive metabolic panel  ? Hemoglobin A1c  ? Lipid panel  ? POCT urinalysis dipstick (Completed)  ? TSH  ? Vitamin B12  ? EKG 12-Lead (Completed)  ? CT HEAD WO CONTRAST (5MM)  ? POC COVID-19 (Completed)  ? POC Influenza A&B (Binax test) (Completed)  ? Essential hypertension  ?  Well controlled, no changes to meds. Encouraged heart healthy diet such as the DASH diet and exercise as tolerated.  ?  ?  ? Head trauma  ?  CT HEAD ? ?  ?  ? Hypothyroid  ?  Check labs  ?  ?  ? Relevant Orders  ? CBC with Differential/Platelet  ? Comprehensive metabolic panel  ? Hemoglobin A1c  ? Lipid panel  ? POCT  urinalysis dipstick (Completed)  ? TSH  ? Vitamin B12  ? Syncope  ?  Check labs ?ekg nsr ?May need carotid doppler  ?  ?  ? Relevant Orders  ? CT HEAD WO CONTRAST (5MM)  ? POC COVID-19 (Completed)  ? POC Influenza A&B (Binax test)

## 2021-07-20 NOTE — Telephone Encounter (Signed)
Nurse Assessment ?Nurse: Sheppard Plumber, RN, Estill Bamberg Date/Time (Eastern Time): 07/20/2021 8:47:52 AM ?Confirm and document reason for call. If ?symptomatic, describe symptoms. ?---caller states she passed out in her bathroom at 3am. ?she got up to void and she felt weird and the next thing ?she new she woke up with her head in the shower. hit ?her head. says it was only a couple minutes. right now ?she is alone. today her stomach is upset, has had a few ?BMs this morning. no fever, bp 132/80 ?Does the patient have any new or worsening ?symptoms? ---Yes ?Will a triage be completed? ---Yes ?Related visit to physician within the last 2 weeks? ---N/A ?Does the PT have any chronic conditions? (i.e. ?diabetes, asthma, this includes High risk factors for ?pregnancy, etc.) ?---Yes ?List chronic conditions. ---HTN on meds, thyroid, issues with palpitations ?Is this a behavioral health or substance abuse call? ---No ?Guidelines ?Guideline Title Affirmed Question Affirmed Notes Nurse Date/Time (Eastern ?Time) ?Fainting Extra heartbeats, ?irregular heart ?beating, or heart is ?beating very fast (i.e., ?"palpitations") ?[place]et, RN, ?Estill Bamberg ?07/20/2021 8:49:05 ?AM ?PLEASE NOTE: All timestamps contained within this report are represented as [place] Standard Time. ?CONFIDENTIALTY NOTICE: This fax transmission is intended only for the addressee. It contains information that is legally privileged, confidential or ?otherwise protected from use or disclosure. If you are not the intended recipient, you are strictly prohibited from reviewing, disclosing, copying using ?or disseminating any of this information or taking any action in reliance on or regarding this information. If you have received this fax in error, please ?notify us immediately by telephone so that we can arrange for its return to [place]. Phone: [phone], Toll-Free: [phone], Fax: [phone] ?Page: 2 of 2 ?Call Id: [id number] ?Disp. Time (Eastern ?Time) Disposition Final  User ?07/20/2021 8:44:43 AM Send to Urgent Daron Offer, Lanette ?07/20/2021 9:04:11 AM 911 Outcome Documentation Hum[place]et, RN, Estill Bamberg ?Reason: voice mail on call back ?07/20/2021 8:55:34 AM Call EMS 911 Now Yes Hum[place]et, RN, Estill Bamberg ?Caller Disagree/Comply Comply ?Caller Understands Yes ?PreDisposition Did not know what to do ?Care Advice Given Per Guideline ?CARE ADVICE given per Fainting (Adult) guideline. CALL EMS 911 NOW: * Immediate medical attention is needed. You need to ?hang up and call 911 (or an ambulance). ?Referrals ?[facility] - ED ?

## 2021-07-20 NOTE — Telephone Encounter (Signed)
Appt today with Dr. Etter Sjogren ?

## 2021-07-20 NOTE — Assessment & Plan Note (Signed)
CT HEAD ? ?

## 2021-07-20 NOTE — Patient Instructions (Signed)
Syncope, Adult ?Syncope refers to a condition in which a person temporarily loses consciousness. Syncope may also be called fainting or passing out. It is caused by a sudden decrease in blood flow to the brain. This can happen for a variety of reasons. ?Most causes of syncope are not dangerous. It can be triggered by things such as needle sticks, seeing blood, pain, or intense emotion. However, syncope can also be a sign of a serious medical problem, such as a heart abnormality. Other causes can include dehydration, migraines, or taking medicines that lower blood pressure. Your health care provider may do tests to find the reason why you are having syncope. ?If you faint, get medical help right away. Call your local emergency services (911 in the [place] ?Follow these instructions at home: ?Pay attention to any changes in your symptoms. Take these actions to stay safe and to help relieve your symptoms: ?Knowing when you may be about to faint ?Signs that you may be about to faint include: ?Feeling dizzy, weak, light-headed, or like the room is spinning. ?Feeling nauseous. ?Seeing spots or seeing all white or all black in your field of vision. ?Having cold, clammy skin or feeling warm and sweaty. ?Hearing ringing in the ears (tinnitus). ?If you start to feel like you might faint, sit or lie down right away. If sitting, put your head down between your legs. If lying down, raise (elevate) your feet above the level of your heart. ?Breathe deeply and steadily. Wait until all the symptoms have passed. ?Have someone stay with you until you feel stable. ?Medicines ?Take over-the-counter and prescription medicines only as told by your health care provider. ?If you are taking blood pressure or heart medicine, get up slowly and take several minutes to sit and then stand. This can reduce dizziness and decrease the risk of syncope. ?Lifestyle ?Do not drive, use machinery, or play sports until your health care provider says it is  okay. ?Do not drink alcohol. ?Do not use any products that contain nicotine or tobacco. These products include cigarettes, chewing tobacco, and vaping devices, such as e-cigarettes. If you need help quitting, ask your health care provider. ?Avoid hot tubs and saunas. ?General instructions ?Talk with your health care provider about your symptoms. You may need to have testing to understand the cause of your syncope. ?Drink enough fluid to keep your urine pale yellow. ?Avoid prolonged standing. If you must stand for a long time, do movements such as: ?Moving your legs. ?Crossing your legs. ?Flexing and stretching your leg muscles. ?Squatting. ?Keep all follow-up visits. This is important. ?Contact a health care provider if: ?You have episodes of near fainting. ?Get help right away if: ?You faint. ?You hit your head or are injured after fainting. ?You have any of these symptoms that may indicate trouble with your heart: ?Fast or irregular heartbeats (palpitations). ?Unusual pain in your chest, abdomen, or back. ?Shortness of breath. ?You have a seizure. ?You have a severe headache. ?You are confused. ?You have vision problems. ?You have severe weakness or trouble walking. ?You are bleeding from your mouth or rectum, or you have black or tarry stool. ?These symptoms may represent a serious problem that is an emergency. Do not wait to see if your symptoms will go away. Get medical help right away. Call your local emergency services (911 in the [place] Do not drive yourself to the hospital. ?Summary ?Syncope refers to a condition in which a person temporarily loses consciousness. Syncope may also be called fainting  or passing out. It is caused by a sudden decrease in blood flow to the brain. ?Signs that you may be about to faint include dizziness, feeling light-headed, feeling nauseous, sudden vision changes, or cold, clammy skin. ?Even though most causes of syncope are not dangerous, syncope can be a sign of a serious  medical problem. Get help right away if you faint. ?If you start to feel like you might faint, sit or lie down right away. If sitting, put your head down between your legs. If lying down, raise (elevate) your feet above the level of your heart. ?This information is not intended to replace advice given to you by your health care provider. Make sure you discuss any questions you have with your health care provider. ?Document Revised: 09/03/2020 Document Reviewed: 09/03/2020 ?Elsevier Patient Education ? [place]. ? ?

## 2021-07-20 NOTE — Assessment & Plan Note (Signed)
Well controlled, no changes to meds. Encouraged heart healthy diet such as the DASH diet and exercise as tolerated.  °

## 2021-07-20 NOTE — Assessment & Plan Note (Signed)
Check labs ?ekg nsr ?May need carotid doppler  ?

## 2021-07-20 NOTE — Assessment & Plan Note (Signed)
Check labs 

## 2021-07-21 ENCOUNTER — Ambulatory Visit: Payer: BC Managed Care – PPO

## 2021-07-21 LAB — CBC WITH DIFFERENTIAL/PLATELET
Basophils Absolute: 0.1 10*3/uL (ref 0.0–0.1)
Basophils Relative: 0.6 % (ref 0.0–3.0)
Eosinophils Absolute: 0 10*3/uL (ref 0.0–0.7)
Eosinophils Relative: 0.2 % (ref 0.0–5.0)
HCT: 40.8 % (ref 36.0–46.0)
Hemoglobin: 13.6 g/dL (ref 12.0–15.0)
Lymphocytes Relative: 16 % (ref 12.0–46.0)
Lymphs Abs: 1.9 10*3/uL (ref 0.7–4.0)
MCHC: 33.4 g/dL (ref 30.0–36.0)
MCV: 87.1 fl (ref 78.0–100.0)
Monocytes Absolute: 0.6 10*3/uL (ref 0.1–1.0)
Monocytes Relative: 5.1 % (ref 3.0–12.0)
Neutro Abs: 9.5 10*3/uL — ABNORMAL HIGH (ref 1.4–7.7)
Neutrophils Relative %: 78.1 % — ABNORMAL HIGH (ref 43.0–77.0)
Platelets: 310 10*3/uL (ref 150.0–400.0)
RBC: 4.69 Mil/uL (ref 3.87–5.11)
RDW: 13.4 % (ref 11.5–15.5)
WBC: 12.1 10*3/uL — ABNORMAL HIGH (ref 4.0–10.5)

## 2021-07-21 LAB — COMPREHENSIVE METABOLIC PANEL
ALT: 17 U/L (ref 0–35)
AST: 17 U/L (ref 0–37)
Albumin: 5.1 g/dL (ref 3.5–5.2)
Alkaline Phosphatase: 89 U/L (ref 39–117)
BUN: 10 mg/dL (ref 6–23)
CO2: 30 mEq/L (ref 19–32)
Calcium: 10.4 mg/dL (ref 8.4–10.5)
Chloride: 95 mEq/L — ABNORMAL LOW (ref 96–112)
Creatinine, Ser: 0.77 mg/dL (ref 0.40–1.20)
GFR: 82.39 mL/min (ref >60.00)
Glucose, Bld: 96 mg/dL (ref 70–99)
Potassium: 3.8 mEq/L (ref 3.5–5.1)
Sodium: 135 mEq/L (ref 135–145)
Total Bilirubin: 1 mg/dL (ref 0.2–1.2)
Total Protein: 7.6 g/dL (ref 6.0–8.3)

## 2021-07-21 LAB — LIPID PANEL
Cholesterol: 172 mg/dL (ref 0–200)
HDL: 62.3 mg/dL (ref >39.00)
LDL Cholesterol: 88 mg/dL (ref 0–99)
NonHDL: 109.72
Total CHOL/HDL Ratio: 3
Triglycerides: 110 mg/dL (ref 0.0–149.0)
VLDL: 22 mg/dL (ref 0.0–40.0)

## 2021-07-21 LAB — VITAMIN B12: Vitamin B-12: 329 pg/mL (ref 211–911)

## 2021-07-21 LAB — HEMOGLOBIN A1C: Hgb A1c MFr Bld: 6.2 % (ref 4.6–6.5)

## 2021-07-21 LAB — TSH: TSH: 0.94 u[IU]/mL (ref 0.35–5.50)

## 2021-07-21 NOTE — Telephone Encounter (Signed)
Requesting: alprazolam 0.'25mg'$   ?Contract: 01/21/2019 ?UDS: 01/21/2019 ?Last Visit: 07/21/2021 ?Next Visit: 09/14/2021 ?Last Refill: 06/21/2021 #60 and 0RF ? ?Please Advise ? ?

## 2021-07-22 ENCOUNTER — Encounter: Payer: Self-pay | Admitting: Family Medicine

## 2021-08-02 ENCOUNTER — Encounter: Payer: Self-pay | Admitting: Family Medicine

## 2021-08-04 ENCOUNTER — Ambulatory Visit
Admission: RE | Admit: 2021-08-04 | Discharge: 2021-08-04 | Disposition: A | Discharge status: Home / Self Care | Payer: BC Managed Care – PPO | Source: Ambulatory Visit | Attending: Family Medicine | Admitting: Family Medicine

## 2021-08-04 DIAGNOSIS — Z1231 Encounter for screening mammogram for malignant neoplasm of breast: Secondary | ICD-10-CM

## 2021-08-05 ENCOUNTER — Ambulatory Visit (HOSPITAL_BASED_OUTPATIENT_CLINIC_OR_DEPARTMENT_OTHER): Payer: BC Managed Care – PPO

## 2021-08-09 ENCOUNTER — Telehealth (HOSPITAL_BASED_OUTPATIENT_CLINIC_OR_DEPARTMENT_OTHER): Payer: Self-pay

## 2021-09-07 ENCOUNTER — Other Ambulatory Visit: Payer: Self-pay | Admitting: Family Medicine

## 2021-09-07 DIAGNOSIS — F419 Anxiety disorder, unspecified: Secondary | ICD-10-CM

## 2021-09-07 NOTE — Telephone Encounter (Signed)
Requesting: alprazolam ?Contract:  01/21/19 ?UDS: 01/21/19 ?Last Visit: 07/20/21 ?Next Visit: 09/14/21 ?Last Refill: 07/21/21 ? ?Please Advise ? ?

## 2021-09-14 ENCOUNTER — Encounter: Payer: BC Managed Care – PPO | Admitting: Family Medicine

## 2021-10-19 ENCOUNTER — Encounter: Payer: BC Managed Care – PPO | Admitting: Family Medicine

## 2021-10-26 ENCOUNTER — Ambulatory Visit (INDEPENDENT_AMBULATORY_CARE_PROVIDER_SITE_OTHER): Payer: BC Managed Care – PPO | Admitting: Family Medicine

## 2021-10-26 ENCOUNTER — Encounter: Payer: Self-pay | Admitting: Family Medicine

## 2021-10-26 VITALS — BP 120/70 | HR 65 | Temp 98.6°F | Resp 18 | Ht 66.0 in | Wt 209.0 lb

## 2021-10-26 DIAGNOSIS — E039 Hypothyroidism, unspecified: Secondary | ICD-10-CM | POA: Diagnosis not present

## 2021-10-26 DIAGNOSIS — R739 Hyperglycemia, unspecified: Secondary | ICD-10-CM

## 2021-10-26 DIAGNOSIS — I10 Essential (primary) hypertension: Secondary | ICD-10-CM | POA: Diagnosis not present

## 2021-10-26 DIAGNOSIS — Z0000 Encounter for general adult medical examination without abnormal findings: Secondary | ICD-10-CM

## 2021-10-26 DIAGNOSIS — F411 Generalized anxiety disorder: Secondary | ICD-10-CM

## 2021-10-26 LAB — COMPREHENSIVE METABOLIC PANEL
ALT: 18 U/L (ref 0–35)
AST: 18 U/L (ref 0–37)
Albumin: 4.6 g/dL (ref 3.5–5.2)
Alkaline Phosphatase: 89 U/L (ref 39–117)
BUN: 9 mg/dL (ref 6–23)
CO2: 30 mEq/L (ref 19–32)
Calcium: 9.8 mg/dL (ref 8.4–10.5)
Chloride: 101 mEq/L (ref 96–112)
Creatinine, Ser: 0.79 mg/dL (ref 0.40–1.20)
GFR: 79.74 mL/min (ref >60.00)
Glucose, Bld: 100 mg/dL — ABNORMAL HIGH (ref 70–99)
Potassium: 4.3 mEq/L (ref 3.5–5.1)
Sodium: 139 mEq/L (ref 135–145)
Total Bilirubin: 0.7 mg/dL (ref 0.2–1.2)
Total Protein: 7.2 g/dL (ref 6.0–8.3)

## 2021-10-26 LAB — CBC WITH DIFFERENTIAL/PLATELET
Basophils Absolute: 0 10*3/uL (ref 0.0–0.1)
Basophils Relative: 0.7 % (ref 0.0–3.0)
Eosinophils Absolute: 0.3 10*3/uL (ref 0.0–0.7)
Eosinophils Relative: 4 % (ref 0.0–5.0)
HCT: 39.1 % (ref 36.0–46.0)
Hemoglobin: 13.1 g/dL (ref 12.0–15.0)
Lymphocytes Relative: 24.1 % (ref 12.0–46.0)
Lymphs Abs: 1.6 10*3/uL (ref 0.7–4.0)
MCHC: 33.5 g/dL (ref 30.0–36.0)
MCV: 88 fl (ref 78.0–100.0)
Monocytes Absolute: 0.4 10*3/uL (ref 0.1–1.0)
Monocytes Relative: 6.3 % (ref 3.0–12.0)
Neutro Abs: 4.2 10*3/uL (ref 1.4–7.7)
Neutrophils Relative %: 64.9 % (ref 43.0–77.0)
Platelets: 266 10*3/uL (ref 150.0–400.0)
RBC: 4.44 Mil/uL (ref 3.87–5.11)
RDW: 13.6 % (ref 11.5–15.5)
WBC: 6.4 10*3/uL (ref 4.0–10.5)

## 2021-10-26 LAB — HEMOGLOBIN A1C: Hgb A1c MFr Bld: 6 % (ref 4.6–6.5)

## 2021-10-26 LAB — LIPID PANEL
Cholesterol: 166 mg/dL (ref 0–200)
HDL: 54.6 mg/dL (ref >39.00)
LDL Cholesterol: 93 mg/dL (ref 0–99)
NonHDL: 111.21
Total CHOL/HDL Ratio: 3
Triglycerides: 93 mg/dL (ref 0.0–149.0)
VLDL: 18.6 mg/dL (ref 0.0–40.0)

## 2021-10-26 LAB — TSH: TSH: 0.55 u[IU]/mL (ref 0.35–5.50)

## 2021-10-26 MED ORDER — METOPROLOL SUCCINATE ER 50 MG PO TB24: 50.0000 mg | ORAL_TABLET | Freq: Every day | ORAL | 1 refills | Status: DC

## 2021-10-26 NOTE — Patient Instructions (Signed)
Preventive Care 40-64 Years Old, Female Preventive care refers to lifestyle choices and visits with your health care provider that can promote health and wellness. Preventive care visits are also called wellness exams. What can I expect for my preventive care visit? Counseling Your health care provider may ask you questions about your: Medical history, including: Past medical problems. Family medical history. Pregnancy history. Current health, including: Menstrual cycle. Method of birth control. Emotional well-being. Home life and relationship well-being. Sexual activity and sexual health. Lifestyle, including: Alcohol, nicotine or tobacco, and drug use. Access to firearms. Diet, exercise, and sleep habits. Work and work environment. Sunscreen use. Safety issues such as seatbelt and bike helmet use. Physical exam Your health care provider will check your: Height and weight. These may be used to calculate your BMI (body mass index). BMI is a measurement that tells if you are at a healthy weight. Waist circumference. This measures the distance around your waistline. This measurement also tells if you are at a healthy weight and may help predict your risk of certain diseases, such as type 2 diabetes and high blood pressure. Heart rate and blood pressure. Body temperature. Skin for abnormal spots. What immunizations do I need?  Vaccines are usually given at various ages, according to a schedule. Your health care provider will recommend vaccines for you based on your age, medical history, and lifestyle or other factors, such as travel or where you work. What tests do I need? Screening Your health care provider may recommend screening tests for certain conditions. This may include: Lipid and cholesterol levels. Diabetes screening. This is done by checking your blood sugar (glucose) after you have not eaten for a while (fasting). Pelvic exam and Pap test. Hepatitis B test. Hepatitis C  test. HIV (human immunodeficiency virus) test. STI (sexually transmitted infection) testing, if you are at risk. Lung cancer screening. Colorectal cancer screening. Mammogram. Talk with your health care provider about when you should start having regular mammograms. This may depend on whether you have a family history of breast cancer. BRCA-related cancer screening. This may be done if you have a family history of breast, ovarian, tubal, or peritoneal cancers. Bone density scan. This is done to screen for osteoporosis. Talk with your health care provider about your test results, treatment options, and if necessary, the need for more tests. Follow these instructions at home: Eating and drinking  Eat a diet that includes fresh fruits and vegetables, whole grains, lean protein, and low-fat dairy products. Take vitamin and mineral supplements as recommended by your health care provider. Do not drink alcohol if: Your health care provider tells you not to drink. You are pregnant, may be pregnant, or are planning to become pregnant. If you drink alcohol: Limit how much you have to 0-1 drink a day. Know how much alcohol is in your drink. In the [place] one drink equals one 12 oz bottle of beer (355 mL), one 5 oz glass of wine (148 mL), or one 1 oz glass of hard liquor (44 mL). Lifestyle Brush your teeth every morning and night with fluoride toothpaste. Floss one time each day. Exercise for at least 30 minutes 5 or more days each week. Do not use any products that contain nicotine or tobacco. These products include cigarettes, chewing tobacco, and vaping devices, such as e-cigarettes. If you need help quitting, ask your health care provider. Do not use drugs. If you are sexually active, practice safe sex. Use a condom or other form of protection to   prevent STIs. If you do not wish to become pregnant, use a form of birth control. If you plan to become pregnant, see your health care provider for a  prepregnancy visit. Take aspirin only as told by your health care provider. Make sure that you understand how much to take and what form to take. Work with your health care provider to find out whether it is safe and beneficial for you to take aspirin daily. Find healthy ways to manage stress, such as: Meditation, yoga, or listening to music. Journaling. Talking to a trusted person. Spending time with friends and family. Minimize exposure to UV radiation to reduce your risk of skin cancer. Safety Always wear your seat belt while driving or riding in a vehicle. Do not drive: If you have been drinking alcohol. Do not ride with someone who has been drinking. When you are tired or distracted. While texting. If you have been using any mind-altering substances or drugs. Wear a helmet and other protective equipment during sports activities. If you have firearms in your house, make sure you follow all gun safety procedures. Seek help if you have been physically or sexually abused. What's next? Visit your health care provider once a year for an annual wellness visit. Ask your health care provider how often you should have your eyes and teeth checked. Stay up to date on all vaccines. This information is not intended to replace advice given to you by your health care provider. Make sure you discuss any questions you have with your health care provider. Document Revised: 10/21/2020 Document Reviewed: 10/21/2020 Elsevier Patient Education  [place].

## 2021-10-26 NOTE — Progress Notes (Signed)
Subjective:     Norma Kelley is a 61 y.o. female and is here for a comprehensive physical exam. The patient reports no problems.  Social History   Socioeconomic History   Marital status: Married    Spouse name: Not on file   Number of children: 3   Years of education: Not on file   Highest education level: Not on file  Occupational History    Employer: [employer]  Tobacco Use   Smoking status: Former    Packs/day: 1.00    Years: 20.00    Total pack years: 20.00    Types: Cigarettes   Smokeless tobacco: Never   Tobacco comments:    Quit 14 years ago  Vaping Use   Vaping Use: Never used  Substance and Sexual Activity   Alcohol use: Yes    Comment: Occasional   Drug use: No   Sexual activity: Yes  Other Topics Concern   Not on file  Social History Narrative   Lives with husband.  Occasional exercise.   Social Determinants of Health   Financial Resource Strain: Not on file  Food Insecurity: Not on file  Transportation Needs: Not on file  Physical Activity: Not on file  Stress: Not on file  Social Connections: Not on file  Intimate Partner Violence: Not on file   Health Maintenance  Topic Date Due   COVID-19 Vaccine (3 - Booster for Janssen series) 11/11/2021 (Originally 05/05/2020)   INFLUENZA VACCINE  12/07/2021   MAMMOGRAM  08/05/2022   TETANUS/TDAP  12/21/2022   COLONOSCOPY (Pts 45-36yr Insurance coverage will need to be confirmed)  12/04/2025   Hepatitis C Screening  Completed   HIV Screening  Completed   Zoster Vaccines- Shingrix  Completed   HPV VACCINES  Aged Out    The following portions of the patient's history were reviewed and updated as appropriate: She  has a past medical history of Allergy, Anxiety, Arthritis, [place] [place]), Depression, Diet-controlled diabetes mellitus ([place], GERD (gastroesophageal reflux disease), and Hypothyroidism. She does not have any pertinent problems on file. She  has a past surgical history that  includes Abdominal hysterectomy; Tonsillectomy; Breast surgery; Mastectomy (Left, 2007); Back surgery (2013); Breast lumpectomy with radioactive seed localization (Right, 08/17/2015); Breast excisional biopsy (Right, 08/17/2015); Colonoscopy; Polypectomy; and Back surgery. Her family history includes Breast cancer (age of onset: 82 in her mother; Colon cancer in her maternal grandfather; Colon polyps in her father; Diabetes in her mother; Head & neck cancer in her maternal grandmother; Heart attack (age of onset: 546 in her father; Heart attack (age of onset: 682 in her mother; Hyperlipidemia in her mother; Osteoporosis in her mother. She  reports that she has quit smoking. Her smoking use included cigarettes. She has a 20.00 pack-year smoking history. She has never used smokeless tobacco. She reports current alcohol use. She reports that she does not use drugs. She has a current medication list which includes the following prescription(s): alprazolam, hydrochlorothiazide, levothyroxine, omeprazole, valacyclovir, and metoprolol succinate. Current Outpatient Medications on File Prior to Visit  Medication Sig Dispense Refill   ALPRAZolam (XANAX) 0.25 MG tablet TAKE 1 TABLET BY MOUTH THREE TIMES DAILY AS NEEDED 60 tablet 1   hydrochlorothiazide (HYDRODIURIL) 25 MG tablet Take 1 tablet (25 mg total) by mouth daily. 90 tablet 3   levothyroxine (SYNTHROID) 100 MCG tablet Take 1 tablet by mouth daily 90 tablet 1   Omeprazole 20 MG TBEC Take 1 tablet by mouth daily.  valACYclovir (VALTREX) 500 MG tablet Take 1 tablet (500 mg total) by mouth daily as needed. 90 tablet 3   No current facility-administered medications on file prior to visit.   She is allergic to sulfamethoxazole and sulfonamide derivatives..  Review of Systems Review of Systems  Constitutional: Negative for activity change, appetite change and fatigue.  HENT: Negative for hearing loss, congestion, tinnitus and ear discharge.  dentist  [id number]: Negative for visual disturbance (see optho q1y -- vision corrected to 20/20 with glasses).  Respiratory: Negative for cough, chest tightness and shortness of breath.   Cardiovascular: Negative for chest pain, palpitations and leg swelling.  Gastrointestinal: Negative for abdominal pain, diarrhea, constipation and abdominal distention.  Genitourinary: Negative for urgency, frequency, decreased urine volume and difficulty urinating.  Musculoskeletal: Negative for back pain, arthralgias and gait problem.  Skin: Negative for color change, pallor and rash.  Neurological: Negative for dizziness, light-headedness, numbness and headaches.  Hematological: Negative for adenopathy. Does not bruise/bleed easily.  Psychiatric/Behavioral: Negative for suicidal ideas, confusion, sleep disturbance, self-injury, dysphoric mood, decreased concentration and agitation.       Objective:    BP 120/70 (BP Location: Left Arm, Patient Position: Sitting, Cuff Size: Large)   Pulse 65   Temp 98.6 F (37 C) (Oral)   Resp 18   Ht '5\' 6"'$  (1.676 m)   Wt 209 lb (94.8 kg)   SpO2 97%   BMI 33.73 kg/m  General appearance: alert, cooperative, and no distress Head: Normocephalic, without obvious abnormality, atraumatic Eyes: conjunctivae/corneas clear. PERRL, EOM's intact. Fundi benign. Ears: normal TM's and external ear canals both ears Nose: Nares normal. Septum midline. Mucosa normal. No drainage or sinus tenderness. Throat: lips, mucosa, and tongue normal; teeth and gums normal Neck: no adenopathy, no carotid bruit, no JVD, supple, symmetrical, trachea midline, and thyroid not enlarged, symmetric, no tenderness/mass/nodules Back: symmetric, no curvature. ROM normal. No CVA tenderness. Lungs: clear to auscultation bilaterally Heart: regular rate and rhythm, S1, S2 normal, no murmur, click, rub or gallop Abdomen: soft, non-tender; bowel sounds normal; no masses,  no organomegaly Extremities: extremities  normal, atraumatic, no cyanosis or edema Pulses: 2+ and symmetric Skin: Skin color, texture, turgor normal. No rashes or lesions Lymph nodes: Cervical, supraclavicular, and axillary nodes normal. Neurologic: Alert and oriented X 3, normal strength and tone. Normal symmetric reflexes. Normal coordination and gait    Assessment:    Healthy female exam.      Plan:    Ghm utd  Check labs  See After Visit Summary for Counseling Recommendations   1. Primary hypertension Well controlled, no changes to meds. Encouraged heart healthy diet such as the DASH diet and exercise as tolerated.   - metoprolol succinate (TOPROL-XL) 50 MG 24 hr tablet; Take 1 tablet (50 mg total) by mouth daily.  Dispense: 90 tablet; Refill: 1 - CBC with Differential/Platelet - Comprehensive metabolic panel - Lipid panel - TSH  2. Hyperglycemia Check labs  Watch simple sugars and starches  - CBC with Differential/Platelet - Comprehensive metabolic panel - Lipid panel - TSH - Hemoglobin A1c  3. Hypothyroidism, unspecified type Check labs  Con't synthroid  - CBC with Differential/Platelet - Comprehensive metabolic panel - Lipid panel - TSH  4. Essential hypertension Well controlled, no changes to meds. Encouraged heart healthy diet such as the DASH diet and exercise as tolerated.   - CBC with Differential/Platelet - Comprehensive metabolic panel - Lipid panel - TSH  5. Preventative health care Ghm utd Check labs  -  CBC with Differential/Platelet - Comprehensive metabolic panel - Lipid panel - TSH  6. Anxiety state Stable

## 2021-11-06 ENCOUNTER — Other Ambulatory Visit: Payer: Self-pay | Admitting: Family Medicine

## 2021-11-15 ENCOUNTER — Other Ambulatory Visit: Payer: Self-pay | Admitting: Family Medicine

## 2021-11-15 DIAGNOSIS — F32A Depression, unspecified: Secondary | ICD-10-CM

## 2021-11-16 NOTE — Telephone Encounter (Signed)
Requesting: alprazolam 0.'25mg'$   Contract:01/21/2019 UDS: 01/21/2019 Last Visit: 10/26/21 Next Visit: 04/28/22 Last Refill: 09/08/21 #60 and 1RF  Please Advise

## 2021-11-26 ENCOUNTER — Encounter: Payer: Self-pay | Admitting: Family Medicine

## 2021-11-29 MED ORDER — VALACYCLOVIR HCL 500 MG PO TABS
500.0000 mg | ORAL_TABLET | Freq: Every day | ORAL | 3 refills | Status: DC | PRN
Start: 2021-11-29 — End: 2023-05-16

## 2021-12-13 ENCOUNTER — Other Ambulatory Visit: Payer: Self-pay | Admitting: *Deleted

## 2021-12-13 DIAGNOSIS — F419 Anxiety disorder, unspecified: Secondary | ICD-10-CM

## 2021-12-13 MED ORDER — ALPRAZOLAM 0.25 MG PO TABS: 0.2500 mg | ORAL_TABLET | Freq: Three times a day (TID) | ORAL | 0 refills | Status: DC | PRN

## 2021-12-13 NOTE — Telephone Encounter (Signed)
Requesting: Alprazolam 0.'25mg'$  Contract: 01/21/19 UDS: 01/21/19 Last Visit:  10/26/21 Next Visit: 04/28/22 Last Refill: 11/16/21 #30 no refill  Please Advise

## 2022-01-15 ENCOUNTER — Other Ambulatory Visit: Payer: Self-pay | Admitting: Family Medicine

## 2022-01-15 DIAGNOSIS — F32A Depression, unspecified: Secondary | ICD-10-CM

## 2022-01-17 NOTE — Telephone Encounter (Signed)
Requesting: alprazolam 0.'25mg'$   Contract:01/21/2019 UDS: 01/21/2019 Last Visit: 10/26/21 Next Visit: 04/28/22 Last Refill: 8/7/2 #60 and 0RF  Please Advise

## 2022-01-18 ENCOUNTER — Encounter: Payer: Self-pay | Admitting: Family Medicine

## 2022-02-10 ENCOUNTER — Ambulatory Visit: Payer: BC Managed Care – PPO

## 2022-02-12 ENCOUNTER — Other Ambulatory Visit: Payer: Self-pay | Admitting: Family Medicine

## 2022-02-12 DIAGNOSIS — I10 Essential (primary) hypertension: Secondary | ICD-10-CM

## 2022-03-14 ENCOUNTER — Other Ambulatory Visit: Payer: Self-pay | Admitting: Family Medicine

## 2022-03-14 DIAGNOSIS — F32A Depression, unspecified: Secondary | ICD-10-CM

## 2022-03-14 NOTE — Telephone Encounter (Signed)
Requesting: alprazolam 0.'25mg'$   Contract: 01/21/2019 UDS:01/21/2019 Last Visit: 10/26/21 Next Visit: 04/28/22 Last Refill: 01/17/22 #60 and 1RF  Please Advise

## 2022-04-07 ENCOUNTER — Encounter: Payer: Self-pay | Admitting: Family Medicine

## 2022-04-28 ENCOUNTER — Ambulatory Visit: Payer: BC Managed Care – PPO | Admitting: Family Medicine

## 2022-04-28 ENCOUNTER — Encounter: Payer: Self-pay | Admitting: Family Medicine

## 2022-04-28 VITALS — BP 132/80 | HR 64 | Temp 98.8°F | Resp 18 | Ht 66.0 in | Wt 210.0 lb

## 2022-04-28 DIAGNOSIS — Z79899 Other long term (current) drug therapy: Secondary | ICD-10-CM

## 2022-04-28 DIAGNOSIS — I10 Essential (primary) hypertension: Secondary | ICD-10-CM

## 2022-04-28 DIAGNOSIS — F32A Depression, unspecified: Secondary | ICD-10-CM

## 2022-04-28 DIAGNOSIS — R739 Hyperglycemia, unspecified: Secondary | ICD-10-CM

## 2022-04-28 DIAGNOSIS — E039 Hypothyroidism, unspecified: Secondary | ICD-10-CM

## 2022-04-28 DIAGNOSIS — F419 Anxiety disorder, unspecified: Secondary | ICD-10-CM | POA: Diagnosis not present

## 2022-04-28 DIAGNOSIS — N760 Acute vaginitis: Secondary | ICD-10-CM

## 2022-04-28 LAB — COMPREHENSIVE METABOLIC PANEL
ALT: 15 U/L (ref 0–35)
AST: 15 U/L (ref 0–37)
Albumin: 4.6 g/dL (ref 3.5–5.2)
Alkaline Phosphatase: 83 U/L (ref 39–117)
BUN: 16 mg/dL (ref 6–23)
CO2: 31 mEq/L (ref 19–32)
Calcium: 9.8 mg/dL (ref 8.4–10.5)
Chloride: 101 mEq/L (ref 96–112)
Creatinine, Ser: 0.79 mg/dL (ref 0.40–1.20)
GFR: 79.46 mL/min (ref >60.00)
Glucose, Bld: 95 mg/dL (ref 70–99)
Potassium: 4.2 mEq/L (ref 3.5–5.1)
Sodium: 140 mEq/L (ref 135–145)
Total Bilirubin: 0.7 mg/dL (ref 0.2–1.2)
Total Protein: 7.1 g/dL (ref 6.0–8.3)

## 2022-04-28 LAB — HEMOGLOBIN A1C: Hgb A1c MFr Bld: 6.2 % (ref 4.6–6.5)

## 2022-04-28 LAB — LIPID PANEL
Cholesterol: 170 mg/dL (ref 0–200)
HDL: 55.8 mg/dL (ref >39.00)
LDL Cholesterol: 97 mg/dL (ref 0–99)
NonHDL: 114.61
Total CHOL/HDL Ratio: 3
Triglycerides: 86 mg/dL (ref 0.0–149.0)
VLDL: 17.2 mg/dL (ref 0.0–40.0)

## 2022-04-28 LAB — TSH: TSH: 1.42 u[IU]/mL (ref 0.35–5.50)

## 2022-04-28 MED ORDER — ALPRAZOLAM 0.25 MG PO TABS: 0.2500 mg | ORAL_TABLET | Freq: Three times a day (TID) | ORAL | 1 refills | Status: DC | PRN

## 2022-04-28 NOTE — Progress Notes (Signed)
Subjective:   By signing my name below, I, Norma Kelley, attest that this documentation has been prepared under the direction and in the presence of Norma Kelley, 04/28/2022.    Patient ID: Norma Kelley, female    DOB: [birth year], 61 y.o.   MRN: [id number]  Chief Complaint  Patient presents with   Hypertension   Hypothyroidism   Follow-up    HPI Patient is in today for an office visit.  Hypertension Patients blood pressure is normal this visit. She is complaint with 25 mg Hydrodiuril and 50 mg Toprol Xl.  Hypothyroidism  Patient is complaint with 100 mcg synthroid.  Anxiety and Depression Patient is requesting a refill on her 0.25 mg Xanax medication.  Immunizations  Patient is uninterested in receiving an influenza vaccine this visit.    There are no preventive care reminders to display for this patient.   Past Medical History:  Diagnosis Date   Allergy    seasonal   Anxiety    Arthritis    fingers   Breast cancer ([facility]) 2007   left breast cancer   Depression    Diet-controlled diabetes mellitus ([facility])    pt. 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   BREAST LUMPECTOMY WITH RADIOACTIVE SEED LOCALIZATION Right 08/17/2015   Procedure: BREAST LUMPECTOMY WITH RADIOACTIVE SEED LOCALIZATION;  Surgeon: Excell Seltzer, MD;  Location: [facility];  Service: General;  Laterality: Right;   BREAST SURGERY     Mastectomy and reconstruction   COLONOSCOPY     MASTECTOMY Left 2007   mastectomy and tamoxifen   POLYPECTOMY     TONSILLECTOMY      Family History  Problem Relation Age of Onset   Hyperlipidemia Mother    Osteoporosis Mother    Diabetes Mother    Heart attack Mother 6   Breast cancer Mother 5   Heart attack Father  89       MASSIVE MI,  Died age 25   Colon polyps Father    Colon cancer Maternal Grandfather        dx in late 60s-early 70s   Head & neck cancer Maternal Grandmother        cancer of the sinuses   Pancreatic cancer Neg Hx    Stomach cancer Neg Hx    Rectal cancer Neg Hx    Esophageal cancer Neg Hx     Social History   Socioeconomic History   Marital status: Married    Spouse name: Not on file   Number of children: 3   Years of education: Not on file   Highest education level: Not on file  Occupational History    Employer: [employer]  Tobacco Use   Smoking status: Former    Packs/day: 1.00    Years: 20.00    Total pack years: 20.00    Types: Cigarettes   Smokeless tobacco: Never   Tobacco comments:    Quit 14 years ago  Vaping Use   Vaping Use: Never used  Substance and Sexual Activity   Alcohol use: Yes    Comment: Occasional   Drug use: No   Sexual activity: Yes  Other Topics Concern   Not on file  Social History Narrative   Lives with husband.  Occasional exercise.   Social Determinants of Health   Financial Resource Strain: Not on file  Food Insecurity: Not on file  Transportation Needs: Not on file  Physical Activity: Not on file  Stress: Not on file  Social Connections: Not on file  Intimate Partner Violence: Not on file    Outpatient Medications Prior to Visit  Medication Sig Dispense Refill   hydrochlorothiazide (HYDRODIURIL) 25 MG tablet TAKE 1 TABLET(25 MG) BY MOUTH DAILY 90 tablet 3   levothyroxine (SYNTHROID) 100 MCG tablet TAKE 1 TABLET BY MOUTH DAILY 90 tablet 1   metoprolol succinate (TOPROL-XL) 50 MG 24 hr tablet Take 1 tablet (50 mg total) by mouth daily. 90 tablet 1   Omeprazole 20 MG TBEC Take 1 tablet by mouth daily.     valACYclovir (VALTREX) 500 MG tablet Take 1 tablet (500 mg total) by mouth daily as needed. 90 tablet 3   ALPRAZolam (XANAX) 0.25 MG tablet TAKE 1 TABLET(0.25 MG) BY MOUTH THREE TIMES DAILY AS NEEDED 60 tablet 1    No facility-administered medications prior to visit.    Allergies  Allergen Reactions   Sulfamethoxazole Itching    Swelling around eyes   Sulfonamide Derivatives     Review of Systems  Constitutional:  Negative for chills, fever and malaise/fatigue.  HENT:  Negative for congestion and hearing loss.   Eyes:  Negative for blurred vision and discharge.  Respiratory:  Negative for cough, sputum production and shortness of breath.   Cardiovascular:  Negative for chest pain, palpitations and leg swelling.  Gastrointestinal:  Negative for abdominal pain, blood in stool, constipation, diarrhea, heartburn, nausea and vomiting.  Genitourinary:  Negative for dysuria, frequency, hematuria and urgency.  Musculoskeletal:  Negative for back pain, falls and myalgias.  Skin:  Negative for rash.  Neurological:  Negative for dizziness, sensory change, loss of consciousness, weakness and headaches.  Endo/Heme/Allergies:  Negative for environmental allergies. Does not bruise/bleed easily.  Psychiatric/Behavioral:  Negative for depression and suicidal ideas. The patient is not nervous/anxious and does not have insomnia.        Objective:    Physical Exam Vitals and nursing note reviewed.  Constitutional:      General: She is not in acute distress.    Appearance: Normal appearance. She is not ill-appearing.  HENT:     Head: Normocephalic and atraumatic.     Right Ear: External ear normal.     Left Ear: External ear normal.  Eyes:     Extraocular Movements: Extraocular movements intact.     Pupils: Pupils are equal, round, and reactive to light.  Cardiovascular:     Rate and Rhythm: Normal rate and regular rhythm.     Heart sounds: Normal heart sounds. No murmur heard.    No gallop.  Pulmonary:     Effort: Pulmonary effort is normal. No respiratory distress.     Breath sounds: Normal breath sounds. No wheezing or rales.  Skin:    General: Skin is warm and dry.  Neurological:     Mental  Status: She is alert and oriented to person, place, and time.  Psychiatric:        Judgment: Judgment normal.     BP 132/80 (BP Location: Right Arm, Patient Position: Sitting, Cuff Size: Large)   Pulse 64   Temp 98.8 F (37.1 C) (Oral)   Resp 18   Ht '5\' 6"'$  (1.676 m)   Wt 210 lb (95.3 kg)  SpO2 97%   BMI 33.89 kg/m  Wt Readings from Last 3 Encounters:  04/28/22 210 lb (95.3 kg)  10/26/21 209 lb (94.8 kg)  07/20/21 207 lb (93.9 kg)       Assessment & Plan:   Problem List Items Addressed This Visit       Unprioritized   Hypothyroidism    Check labs Con' t  synthroid       Relevant Orders   TSH   Essential hypertension    Well controlled, no changes to meds. Encouraged heart healthy diet such as the DASH diet and exercise as tolerated.        Other Visit Diagnoses     Primary hypertension    -  Primary   Relevant Orders   Comprehensive metabolic panel   TSH   Lipid panel   Anxiety and depression       Relevant Medications   ALPRAZolam (XANAX) 0.25 MG tablet   Hyperglycemia       Relevant Orders   Comprehensive metabolic panel   TSH   Lipid panel   Hemoglobin A1c   Acute vaginitis          Meds ordered this encounter  Medications   ALPRAZolam (XANAX) 0.25 MG tablet    Sig: Take 1 tablet (0.25 mg total) by mouth 3 (three) times daily as needed for anxiety.    Dispense:  60 tablet    Refill:  1    I, Norma Kelley, personally preformed the services described in this documentation.  All medical record entries made by the scribe were at my direction and in my presence.  I have reviewed the chart and discharge instructions (if applicable) and agree that the record reflects my personal performance and is accurate and complete. 04/28/2022.   I,Verona Buck,acting as a Education administrator for [employer], DO.,have documented all relevant documentation on the behalf of Ann Held, DO,as directed by  Ann Held, DO while in the presence  of Ann Held, DO.    Ann Held, DO

## 2022-04-28 NOTE — Assessment & Plan Note (Signed)
Check labs  Con't synthroid 

## 2022-04-28 NOTE — Addendum Note (Signed)
Addended by: Sanda Linger on: 04/28/2022 01:20 PM   Modules accepted: Orders

## 2022-04-28 NOTE — Assessment & Plan Note (Signed)
Well controlled, no changes to meds. Encouraged heart healthy diet such as the DASH diet and exercise as tolerated.  °

## 2022-04-30 LAB — DRUG MONITORING PANEL 376104, URINE

## 2022-04-30 LAB — DM TEMPLATE

## 2022-05-10 ENCOUNTER — Other Ambulatory Visit: Payer: Self-pay | Admitting: Family Medicine

## 2022-05-10 DIAGNOSIS — I10 Essential (primary) hypertension: Secondary | ICD-10-CM

## 2022-06-22 ENCOUNTER — Other Ambulatory Visit: Payer: Self-pay | Admitting: Family Medicine

## 2022-06-22 DIAGNOSIS — Z1231 Encounter for screening mammogram for malignant neoplasm of breast: Secondary | ICD-10-CM

## 2022-06-30 ENCOUNTER — Other Ambulatory Visit: Payer: Self-pay | Admitting: Family Medicine

## 2022-06-30 DIAGNOSIS — F32A Depression, unspecified: Secondary | ICD-10-CM

## 2022-06-30 NOTE — Telephone Encounter (Signed)
Requesting: alprazolam 0.38m  Contract: 01/21/2019 UDS: 04/28/22 Last Visit: 04/28/22 Next Visit: 11/01/22 Last Refill: 04/28/22 #60 and 1RF   Please Advise

## 2022-08-08 ENCOUNTER — Ambulatory Visit
Admission: RE | Admit: 2022-08-08 | Discharge: 2022-08-08 | Disposition: A | Discharge status: Home / Self Care | Payer: BC Managed Care – PPO | Source: Ambulatory Visit | Attending: Family Medicine | Admitting: Family Medicine

## 2022-08-08 ENCOUNTER — Other Ambulatory Visit: Payer: Self-pay | Admitting: Family Medicine

## 2022-08-08 DIAGNOSIS — Z1231 Encounter for screening mammogram for malignant neoplasm of breast: Secondary | ICD-10-CM

## 2022-08-15 ENCOUNTER — Other Ambulatory Visit: Payer: Self-pay | Admitting: Family Medicine

## 2022-08-31 ENCOUNTER — Other Ambulatory Visit: Payer: Self-pay | Admitting: Family Medicine

## 2022-08-31 DIAGNOSIS — F32A Depression, unspecified: Secondary | ICD-10-CM

## 2022-11-01 ENCOUNTER — Encounter: Payer: Self-pay | Admitting: Family Medicine

## 2022-11-01 ENCOUNTER — Ambulatory Visit (INDEPENDENT_AMBULATORY_CARE_PROVIDER_SITE_OTHER): Payer: BC Managed Care – PPO | Admitting: Family Medicine

## 2022-11-01 VITALS — BP 120/70 | HR 66 | Temp 98.3°F | Resp 18 | Ht 66.0 in | Wt 206.4 lb

## 2022-11-01 DIAGNOSIS — F411 Generalized anxiety disorder: Secondary | ICD-10-CM

## 2022-11-01 DIAGNOSIS — F419 Anxiety disorder, unspecified: Secondary | ICD-10-CM | POA: Diagnosis not present

## 2022-11-01 DIAGNOSIS — Z79899 Other long term (current) drug therapy: Secondary | ICD-10-CM

## 2022-11-01 DIAGNOSIS — I10 Essential (primary) hypertension: Secondary | ICD-10-CM

## 2022-11-01 DIAGNOSIS — F32A Depression, unspecified: Secondary | ICD-10-CM

## 2022-11-01 DIAGNOSIS — Z0000 Encounter for general adult medical examination without abnormal findings: Secondary | ICD-10-CM | POA: Diagnosis not present

## 2022-11-01 DIAGNOSIS — E039 Hypothyroidism, unspecified: Secondary | ICD-10-CM | POA: Diagnosis not present

## 2022-11-01 DIAGNOSIS — E890 Postprocedural hypothyroidism: Secondary | ICD-10-CM

## 2022-11-01 DIAGNOSIS — R739 Hyperglycemia, unspecified: Secondary | ICD-10-CM | POA: Diagnosis not present

## 2022-11-01 LAB — LIPID PANEL
Cholesterol: 158 mg/dL (ref 0–200)
HDL: 48.2 mg/dL (ref >39.00)
LDL Cholesterol: 91 mg/dL (ref 0–99)
NonHDL: 109.83
Total CHOL/HDL Ratio: 3
Triglycerides: 92 mg/dL (ref 0.0–149.0)
VLDL: 18.4 mg/dL (ref 0.0–40.0)

## 2022-11-01 LAB — CBC WITH DIFFERENTIAL/PLATELET
Basophils Absolute: 0 10*3/uL (ref 0.0–0.1)
Basophils Relative: 0.7 % (ref 0.0–3.0)
Eosinophils Absolute: 0.2 10*3/uL (ref 0.0–0.7)
Eosinophils Relative: 2.9 % (ref 0.0–5.0)
HCT: 39.6 % (ref 36.0–46.0)
Hemoglobin: 13.2 g/dL (ref 12.0–15.0)
Lymphocytes Relative: 29.5 % (ref 12.0–46.0)
Lymphs Abs: 1.8 10*3/uL (ref 0.7–4.0)
MCHC: 33.3 g/dL (ref 30.0–36.0)
MCV: 86.9 fl (ref 78.0–100.0)
Monocytes Absolute: 0.4 10*3/uL (ref 0.1–1.0)
Monocytes Relative: 6.7 % (ref 3.0–12.0)
Neutro Abs: 3.7 10*3/uL (ref 1.4–7.7)
Neutrophils Relative %: 60.2 % (ref 43.0–77.0)
Platelets: 265 10*3/uL (ref 150.0–400.0)
RBC: 4.55 Mil/uL (ref 3.87–5.11)
RDW: 13.5 % (ref 11.5–15.5)
WBC: 6.2 10*3/uL (ref 4.0–10.5)

## 2022-11-01 LAB — COMPREHENSIVE METABOLIC PANEL
ALT: 16 U/L (ref 0–35)
AST: 17 U/L (ref 0–37)
Albumin: 4.8 g/dL (ref 3.5–5.2)
Alkaline Phosphatase: 78 U/L (ref 39–117)
BUN: 13 mg/dL (ref 6–23)
CO2: 30 mEq/L (ref 19–32)
Calcium: 10.1 mg/dL (ref 8.4–10.5)
Chloride: 98 mEq/L (ref 96–112)
Creatinine, Ser: 0.79 mg/dL (ref 0.40–1.20)
GFR: 79.18 mL/min (ref >60.00)
Glucose, Bld: 89 mg/dL (ref 70–99)
Potassium: 4.2 mEq/L (ref 3.5–5.1)
Sodium: 138 mEq/L (ref 135–145)
Total Bilirubin: 0.8 mg/dL (ref 0.2–1.2)
Total Protein: 7.3 g/dL (ref 6.0–8.3)

## 2022-11-01 LAB — TSH: TSH: 1.2 u[IU]/mL (ref 0.35–5.50)

## 2022-11-01 MED ORDER — METOPROLOL SUCCINATE ER 50 MG PO TB24: ORAL_TABLET | ORAL | 1 refills | Status: DC

## 2022-11-01 MED ORDER — ALPRAZOLAM 0.25 MG PO TABS: ORAL_TABLET | ORAL | 1 refills | Status: DC

## 2022-11-01 NOTE — Assessment & Plan Note (Signed)
More stress lately -- things are improving  Refill xanax

## 2022-11-01 NOTE — Assessment & Plan Note (Signed)
Well controlled, no changes to meds. Encouraged heart healthy diet such as the DASH diet and exercise as tolerated.  °

## 2022-11-01 NOTE — Assessment & Plan Note (Signed)
Cont synthroid Check labs 

## 2022-11-01 NOTE — Assessment & Plan Note (Signed)
Lab Results  Component Value Date   TSH 1.42 04/28/2022   Stable  Check labs Con't synthroid

## 2022-11-01 NOTE — Progress Notes (Signed)
Established Patient Office Visit  Subjective   Patient ID: Norma Kelley, female    DOB: Sep 24, 1960  Age: 62 y.o. MRN: [id number]  Chief Complaint  Patient presents with   Annual Exam    Pt states fasting.     HPI Discussed the use of AI scribe software for clinical note transcription with the patient, who gave verbal consent to proceed.  History of Present Illness   The patient, an Print production planner at [employer], presents with increased stress and anxiety due to recent life changes. Their husband recently lost his job due to Health and safety inspector, causing financial strain and uncertainty about health insurance. However, the husband has secured a new job starting in mid-July, which has provided some relief. The patient is also looking forward to retirement in three and a half years, as she feels overworked and burnt out due to the [employer] constraints.  The patient is up-to-date with their health maintenance, including regular dental and eye check-ups, and has an upcoming appointment with the eye doctor. They also have a full body check scheduled due to numerous moles. They have received both the J&J and Moderna COVID vaccines and the shingles vaccine.  The patient is currently on hydrochlorothiazide, Valtrex, Xanax, and thyroid medication. They believe they have refills for their thyroid and blood pressure medication, but the Valtrex and metoprolol may run out next month.      Patient Active Problem List   Diagnosis Date Noted   Hyperglycemia 11/01/2022   Dizziness 07/20/2021   Syncope 07/20/2021   Head trauma 07/20/2021   Depression, recurrent (HCC) 10/13/2020   GERD (gastroesophageal reflux disease)    Palpitations 05/17/2019   Essential hypertension 04/22/2019   Palpitation 04/22/2019   Low back pain 04/12/2018   Viral syndrome 04/12/2018   Hemorrhoids 09/23/2017   Vaginal itching 09/23/2017   Preventative health care 09/23/2017   Paronychia of left middle finger 06/26/2016    Meningitis 09/09/2015   Hypothyroidism 09/09/2015   Genetic testing 08/11/2015   Breast cancer (HCC) 07/30/2015   Precordial pain 04/02/2013   Insomnia 04/01/2013   Bronchitis, acute 08/10/2011   Back pain with radiation 10/13/2010   Hypothyroid 02/11/2008   URTICARIA 02/11/2008   Contact dermatitis and other eczema, due to unspecified cause 09/10/2007   ACUTE CYSTITIS 08/13/2007   Reactive depression 02/03/2007   BREAST CANCER, HX OF 02/03/2007   Anxiety state 09/06/2006   MASTECTOMY, LEFT, HX OF 09/06/2006   TOTAL ABDOMINAL HYSTERECTOMY, HX OF 09/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   BREAST LUMPECTOMY WITH RADIOACTIVE SEED LOCALIZATION Right 08/17/2015   Procedure: BREAST LUMPECTOMY WITH RADIOACTIVE SEED LOCALIZATION;  Surgeon: Glenna Fellows, MD;  Location: [facility];  Service: General;  Laterality: Right;   BREAST SURGERY     Mastectomy and reconstruction   COLONOSCOPY     MASTECTOMY Left 2007   mastectomy and tamoxifen   POLYPECTOMY     TONSILLECTOMY  Social History   Tobacco Use   Smoking status: Former    Packs/day: 1.00    Years: 20.00    Additional pack years: 0.00    Total pack years: 20.00    Types: Cigarettes   Smokeless tobacco: Never   Tobacco comments:    Quit 14 years ago  Vaping Use   Vaping Use: Never used  Substance Use Topics   Alcohol use: Yes    Comment: Occasional   Drug use: No   Social History   Socioeconomic History   Marital status: Married    Spouse name: Not on file    Number of children: 3   Years of education: Not on file   Highest education level: Not on file  Occupational History    Employer: [employer]  Tobacco Use   Smoking status: Former    Packs/day: 1.00    Years: 20.00    Additional pack years: 0.00    Total pack years: 20.00    Types: Cigarettes   Smokeless tobacco: Never   Tobacco comments:    Quit 14 years ago  Vaping Use   Vaping Use: Never used  Substance and Sexual Activity   Alcohol use: Yes    Comment: Occasional   Drug use: No   Sexual activity: Yes  Other Topics Concern   Not on file  Social History Narrative   Lives with husband.  Occasional exercise.   Social Determinants of Health   Financial Resource Strain: Not on file  Food Insecurity: Not on file  Transportation Needs: Not on file  Physical Activity: Not on file  Stress: Not on file  Social Connections: Not on file  Intimate Partner Violence: Not on file   Family Status  Relation Name Status   Mother  Alive   Father  Deceased at age 44   MGF  Deceased at age 84   Mat Uncle  Alive   Pat Aunt  Deceased   Pat Uncle x4 Deceased   MGM  Deceased at age 91   PGM  Deceased at age 63s   PGF  Deceased at age 79s   Mat Uncle  Alive   Son  Alive   Son  Alive   Daughter  Alive   Neg Hx  (Not Specified)   Family History  Problem Relation Age of Onset   Hyperlipidemia Mother    Osteoporosis Mother    Diabetes Mother    Heart attack Mother 44   Breast cancer Mother 72   Heart attack Father 64       MASSIVE MI,  Died age 41   Colon polyps Father    Colon cancer Maternal Grandfather        dx in late 60s-early 59s   Head & neck cancer Maternal Grandmother        cancer of the sinuses   Pancreatic cancer Neg Hx    Stomach cancer Neg Hx    Rectal cancer Neg Hx    Esophageal cancer Neg Hx    Allergies  Allergen Reactions   Sulfamethoxazole Itching    Swelling around eyes   Sulfonamide Derivatives       Review of Systems  Constitutional:   Negative for chills, fever and malaise/fatigue.  HENT:  Negative for congestion and hearing loss.   Eyes:  Negative for blurred vision and discharge.  Respiratory:  Negative for cough, sputum production and shortness of breath.   Cardiovascular:  Negative for chest pain, palpitations and  leg swelling.  Gastrointestinal:  Negative for abdominal pain, blood in stool, constipation, diarrhea, heartburn, nausea and vomiting.  Genitourinary:  Negative for dysuria, frequency, hematuria and urgency.  Musculoskeletal:  Negative for back pain, falls and myalgias.  Skin:  Negative for rash.  Neurological:  Negative for dizziness, sensory change, loss of consciousness, weakness and headaches.  Endo/Heme/Allergies:  Negative for environmental allergies. Does not bruise/bleed easily.  Psychiatric/Behavioral:  Negative for depression and suicidal ideas. The patient is not nervous/anxious and does not have insomnia.       Objective:     BP 120/70 (BP Location: Left Arm, Patient Position: Sitting, Cuff Size: Large)   Pulse 66   Temp 98.3 F (36.8 C) (Oral)   Resp 18   Ht 5\' 6"  (1.676 m)   Wt 206 lb 6.4 oz (93.6 kg)   SpO2 98%   BMI 33.31 kg/m  BP Readings from Last 3 Encounters:  11/01/22 120/70  04/28/22 132/80  10/26/21 120/70   Wt Readings from Last 3 Encounters:  11/01/22 206 lb 6.4 oz (93.6 kg)  04/28/22 210 lb (95.3 kg)  10/26/21 209 lb (94.8 kg)   SpO2 Readings from Last 3 Encounters:  11/01/22 98%  04/28/22 97%  10/26/21 97%      Physical Exam Vitals and nursing note reviewed.  Constitutional:      General: She is not in acute distress.    Appearance: She is well-developed.  HENT:     Head: Normocephalic and atraumatic.     Right Ear: External ear normal.     Left Ear: External ear normal.     Nose: Nose normal.     Mouth/Throat:     Mouth: Mucous membranes are moist.     Pharynx: Oropharynx is clear.  Eyes:     Conjunctiva/sclera: Conjunctivae normal.     Pupils:  Pupils are equal, round, and reactive to light.  Neck:     Thyroid: No thyromegaly.     Vascular: No carotid bruit or JVD.  Cardiovascular:     Rate and Rhythm: Normal rate and regular rhythm.     Heart sounds: Normal heart sounds. No murmur heard. Pulmonary:     Effort: Pulmonary effort is normal. No respiratory distress.     Breath sounds: Normal breath sounds. No wheezing or rales.  Chest:     Chest wall: No tenderness.  Abdominal:     Tenderness: There is no abdominal tenderness. There is no guarding or rebound.  Musculoskeletal:        General: Normal range of motion.     Cervical back: Normal range of motion and neck supple.  Neurological:     General: No focal deficit present.     Mental Status: She is alert and oriented to person, place, and time.  Psychiatric:        Behavior: Behavior normal.        Thought Content: Thought content normal.        Judgment: Judgment normal.      No results found for any visits on 11/01/22.  Last CBC Lab Results  Component Value Date   WBC 6.4 10/26/2021   HGB 13.1 10/26/2021   HCT 39.1 10/26/2021   MCV 88.0 10/26/2021   MCH 29.3 09/10/2015   RDW 13.6 10/26/2021   PLT 266.0 10/26/2021   Last metabolic panel Lab Results  Component Value Date   GLUCOSE 95 04/28/2022   NA 140 04/28/2022   K 4.2 04/28/2022   CL 101  04/28/2022   CO2 31 04/28/2022   BUN 16 04/28/2022   CREATININE 0.79 04/28/2022   GFRNONAA >60 09/10/2015   CALCIUM 9.8 04/28/2022   PROT 7.1 04/28/2022   ALBUMIN 4.6 04/28/2022   BILITOT 0.7 04/28/2022   ALKPHOS 83 04/28/2022   AST 15 04/28/2022   ALT 15 04/28/2022   ANIONGAP 9 09/10/2015   Last lipids Lab Results  Component Value Date   CHOL 170 04/28/2022   HDL 55.80 04/28/2022   LDLCALC 97 04/28/2022   TRIG 86.0 04/28/2022   CHOLHDL 3 04/28/2022   Last hemoglobin A1c Lab Results  Component Value Date   HGBA1C 6.2 04/28/2022   Last thyroid functions Lab Results  Component Value Date   TSH  1.42 04/28/2022   T4TOTAL 9.5 03/01/2021   Last vitamin D Lab Results  Component Value Date   VD25OH 31 10/14/2011   Last vitamin B12 and Folate Lab Results  Component Value Date   VITAMINB12 329 07/20/2021   FOLATE 12.0 10/07/2008      The 10-year ASCVD risk score (Arnett DK, et al., 2019) is: 5.4%    Assessment & Plan:   Problem List Items Addressed This Visit       Unprioritized   Preventative health care - Primary    Ghm utd Check labs Health Maintenance  Topic Date Due   COVID-19 Vaccine (3 - 2023-24 season) 11/17/2022 (Originally 01/07/2022)   INFLUENZA VACCINE  12/08/2022   DTaP/Tdap/Td (2 - Td or Tdap) 12/21/2022   MAMMOGRAM  08/08/2023   Colonoscopy  12/04/2025   Hepatitis C Screening  Completed   HIV Screening  Completed   Zoster Vaccines- Shingrix  Completed   HPV VACCINES  Aged Out   Lung Cancer Screening  Discontinued          Relevant Orders   CBC with Differential/Platelet   Comprehensive metabolic panel   Lipid panel   TSH   Hypothyroidism    Lab Results  Component Value Date   TSH 1.42 04/28/2022  Stable  Check labs Con't synthroid      Relevant Medications   metoprolol succinate (TOPROL-XL) 50 MG 24 hr tablet   Other Relevant Orders   TSH   Hypothyroid    Con't synthroid Check labs       Relevant Medications   metoprolol succinate (TOPROL-XL) 50 MG 24 hr tablet   Other Relevant Orders   TSH   Hyperglycemia   Relevant Orders   Comprehensive metabolic panel   Essential hypertension    Well controlled, no changes to meds. Encouraged heart healthy diet such as the DASH diet and exercise as tolerated.        Relevant Medications   metoprolol succinate (TOPROL-XL) 50 MG 24 hr tablet   Anxiety state    More stress lately -- things are improving  Refill xanax       Relevant Medications   ALPRAZolam (XANAX) 0.25 MG tablet   Other Visit Diagnoses     Anxiety and depression       Relevant Medications   ALPRAZolam (XANAX)  0.25 MG tablet   High risk medication use       Relevant Orders   Drug Monitoring Panel [phone] , Urine   Primary hypertension       Relevant Medications   metoprolol succinate (TOPROL-XL) 50 MG 24 hr tablet   Other Relevant Orders   CBC with Differential/Platelet   Comprehensive metabolic panel   Lipid panel  Return in about 6 months (around 05/03/2023), or if symptoms worsen or fail to improve.  Assessment and Plan    Anxiety: Increased stress due to recent life changes including husband's job loss. No changes in medication or treatment plan discussed. -Continue current management.  Hypertension: Managed with Hydrochlorothiazide. No changes in symptoms or medication discussed. -Continue Hydrochlorothiazide.  Hypothyroidism: Managed with thyroid medication. No changes in symptoms or medication discussed. -Continue current thyroid medication.  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 No changes in symptoms or medication discussed. -Continue Valtrex as needed.  General Health Maintenance: -Continue regular dental and eye check-ups. -Continue regular dermatology check-ups due to presence of multiple moles. -Ensure all medications are refilled as needed, specifically Metoprolol which will run out next month. -Continue regular screenings as per guidelines.        Donato Schultz, DO

## 2022-11-01 NOTE — Patient Instructions (Signed)
Preventive Care 40-64 Years Old, Female Preventive care refers to lifestyle choices and visits with your health care provider that can promote health and wellness. Preventive care visits are also called wellness exams. What can I expect for my preventive care visit? Counseling Your health care provider may ask you questions about your: Medical history, including: Past medical problems. Family medical history. Pregnancy history. Current health, including: Menstrual cycle. Method of birth control. Emotional well-being. Home life and relationship well-being. Sexual activity and sexual health. Lifestyle, including: Alcohol, nicotine or tobacco, and drug use. Access to firearms. Diet, exercise, and sleep habits. Work and work environment. Sunscreen use. Safety issues such as seatbelt and bike helmet use. Physical exam Your health care provider will check your: Height and weight. These may be used to calculate your BMI (body mass index). BMI is a measurement that tells if you are at a healthy weight. Waist circumference. This measures the distance around your waistline. This measurement also tells if you are at a healthy weight and may help predict your risk of certain diseases, such as type 2 diabetes and high blood pressure. Heart rate and blood pressure. Body temperature. Skin for abnormal spots. What immunizations do I need?  Vaccines are usually given at various ages, according to a schedule. Your health care provider will recommend vaccines for you based on your age, medical history, and lifestyle or other factors, such as travel or where you work. What tests do I need? Screening Your health care provider may recommend screening tests for certain conditions. This may include: Lipid and cholesterol levels. Diabetes screening. This is done by checking your blood sugar (glucose) after you have not eaten for a while (fasting). Pelvic exam and Pap test. Hepatitis B test. Hepatitis C  test. HIV (human immunodeficiency virus) test. STI (sexually transmitted infection) testing, if you are at risk. Lung cancer screening. Colorectal cancer screening. Mammogram. Talk with your health care provider about when you should start having regular mammograms. This may depend on whether you have a family history of breast cancer. BRCA-related cancer screening. This may be done if you have a family history of breast, ovarian, tubal, or peritoneal cancers. Bone density scan. This is done to screen for osteoporosis. Talk with your health care provider about your test results, treatment options, and if necessary, the need for more tests. Follow these instructions at home: Eating and drinking  Eat a diet that includes fresh fruits and vegetables, whole grains, lean protein, and low-fat dairy products. Take vitamin and mineral supplements as recommended by your health care provider. Do not drink alcohol if: Your health care provider tells you not to drink. You are pregnant, may be pregnant, or are planning to become pregnant. If you drink alcohol: Limit how much you have to 0-1 drink a day. Know how much alcohol is in your drink. In the [place] one drink equals one 12 oz bottle of beer (355 mL), one 5 oz glass of wine (148 mL), or one 1 oz glass of hard liquor (44 mL). Lifestyle Brush your teeth every morning and night with fluoride toothpaste. Floss one time each day. Exercise for at least 30 minutes 5 or more days each week. Do not use any products that contain nicotine or tobacco. These products include cigarettes, chewing tobacco, and vaping devices, such as e-cigarettes. If you need help quitting, ask your health care provider. Do not use drugs. If you are sexually active, practice safe sex. Use a condom or other form of protection to   prevent STIs. If you do not wish to become pregnant, use a form of birth control. If you plan to become pregnant, see your health care provider for a  prepregnancy visit. Take aspirin only as told by your health care provider. Make sure that you understand how much to take and what form to take. Work with your health care provider to find out whether it is safe and beneficial for you to take aspirin daily. Find healthy ways to manage stress, such as: Meditation, yoga, or listening to music. Journaling. Talking to a trusted person. Spending time with friends and family. Minimize exposure to UV radiation to reduce your risk of skin cancer. Safety Always wear your seat belt while driving or riding in a vehicle. Do not drive: If you have been drinking alcohol. Do not ride with someone who has been drinking. When you are tired or distracted. While texting. If you have been using any mind-altering substances or drugs. Wear a helmet and other protective equipment during sports activities. If you have firearms in your house, make sure you follow all gun safety procedures. Seek help if you have been physically or sexually abused. What's next? Visit your health care provider once a year for an annual wellness visit. Ask your health care provider how often you should have your eyes and teeth checked. Stay up to date on all vaccines. This information is not intended to replace advice given to you by your health care provider. Make sure you discuss any questions you have with your health care provider. Document Revised: 10/21/2020 Document Reviewed: 10/21/2020 Elsevier Patient Education  2024 Elsevier Inc.  

## 2022-11-01 NOTE — Assessment & Plan Note (Signed)
Ghm utd Check labs Health Maintenance  Topic Date Due   COVID-19 Vaccine (3 - 2023-24 season) 11/17/2022 (Originally 01/07/2022)   INFLUENZA VACCINE  12/08/2022   DTaP/Tdap/Td (2 - Td or Tdap) 12/21/2022   MAMMOGRAM  08/08/2023   Colonoscopy  12/04/2025   Hepatitis C Screening  Completed   HIV Screening  Completed   Zoster Vaccines- Shingrix  Completed   HPV VACCINES  Aged Out   Lung Cancer Screening  Discontinued

## 2022-11-02 LAB — DRUG MONITORING PANEL 376104, URINE
Desmethyltramadol: NEGATIVE ng/mL (ref <100)
Tramadol: NEGATIVE ng/mL (ref <100)

## 2022-11-03 LAB — DRUG MONITORING PANEL 376104, URINE
Alphahydroxyalprazolam: 74 ng/mL — ABNORMAL HIGH (ref <25)
Alphahydroxymidazolam: NEGATIVE ng/mL (ref <50)
Alphahydroxytriazolam: NEGATIVE ng/mL (ref <50)
Aminoclonazepam: NEGATIVE ng/mL (ref <25)
Barbiturates: NEGATIVE ng/mL (ref <300)
Benzodiazepines: POSITIVE ng/mL — AB (ref <100)
Cocaine Metabolite: NEGATIVE ng/mL (ref <150)
Desmethyltramadol: NEGATIVE ng/mL (ref <100)
Hydroxyethylflurazepam: NEGATIVE ng/mL (ref <50)
Lorazepam: NEGATIVE ng/mL (ref <50)
Nordiazepam: NEGATIVE ng/mL (ref <50)
Opiates: NEGATIVE ng/mL (ref <100)
Oxazepam: NEGATIVE ng/mL (ref <50)
Oxycodone: NEGATIVE ng/mL (ref <100)
Temazepam: NEGATIVE ng/mL (ref <50)
Tramadol: NEGATIVE ng/mL (ref <100)

## 2022-11-03 LAB — DM TEMPLATE

## 2022-11-23 ENCOUNTER — Ambulatory Visit: Payer: BC Managed Care – PPO | Admitting: Podiatry

## 2022-11-23 DIAGNOSIS — M722 Plantar fascial fibromatosis: Secondary | ICD-10-CM

## 2022-11-23 MED ORDER — MELOXICAM 15 MG PO TABS: 15.0000 mg | ORAL_TABLET | Freq: Every day | ORAL | 1 refills | Status: DC

## 2022-11-23 MED ORDER — BETAMETHASONE SOD PHOS & ACET 6 (3-3) MG/ML IJ SUSP
3.0000 mg | Freq: Once | INTRAMUSCULAR | Status: AC
Start: 2022-11-23 — End: 2022-11-23
  Administered 2022-11-23: 3 mg via INTRA_ARTICULAR

## 2022-11-23 NOTE — Progress Notes (Addendum)
   Chief Complaint  Patient presents with   Plantar Fasciitis    Heel pain that started about 3 weeks ago.she Iced it but did not do any better she have not seen anyone about it  She takes ibuprofen and tylenol daily for the pain but the pain is not going away.    Subjective: 62 y.o. female presenting today as a new patient for evaluation of left heel pain ongoing for about 3 weeks now.  There has been some slight improvement.  She has tried different insoles and taking anti-inflammatories with minimal improvement.  She believes this may have started with a pair of sandals that she wore   Past Medical History:  Diagnosis Date   Allergy    seasonal   Anxiety    Arthritis    fingers   Breast cancer (HCC) 2007   left breast cancer   Depression    Diet-controlled diabetes mellitus (HCC)    pt. denies 11/21/18   GERD (gastroesophageal reflux disease)    Hypothyroidism      Objective: Physical Exam General: The patient is alert and oriented x3 in no acute distress.  Dermatology: Skin is warm, dry and supple bilateral lower extremities. Negative for open lesions or macerations bilateral.   Vascular: Dorsalis Pedis and Posterior Tibial pulses palpable bilateral.  Capillary fill time is immediate to all digits.  Neurological: Epicritic and protective threshold intact bilateral.   Musculoskeletal: Tenderness to palpation to the plantar aspect of the left heel along the plantar fascia. All other joints range of motion within normal limits bilateral. Strength 5/5 in all groups bilateral.   Radiographic exam LT foot 11/23/2022: Normal osseous mineralization. Joint spaces preserved. No fracture/dislocation/boney destruction. No other soft tissue abnormalities or radiopaque foreign bodies.   Assessment: 1. Plantar fasciitis left foot  Plan of Care:  1. Patient evaluated. Xrays reviewed.   2. Injection of 0.5cc Celestone soluspan injected into the left plantar fascia.  3.  Patient has  side effects from the Medrol Dosepak.  Not prescribed 4. Rx for Meloxicam ordered for patient. 5.  Recommend good supportive shoes and sandals.  Recommended [employer] 6. Instructed patient regarding therapies and modalities at home to alleviate symptoms.  7. Return to clinic in 4 weeks.     Felecia Shelling, DPM [facility]  Dr. Felecia Shelling, DPM    [address] [place], [place] [address]                Office [phone]  Fax [phone]

## 2022-12-02 ENCOUNTER — Encounter: Payer: Self-pay | Admitting: Family Medicine

## 2022-12-26 ENCOUNTER — Other Ambulatory Visit: Payer: Self-pay | Admitting: Family Medicine

## 2022-12-26 ENCOUNTER — Ambulatory Visit: Payer: BC Managed Care – PPO | Admitting: Podiatry

## 2022-12-26 DIAGNOSIS — F32A Depression, unspecified: Secondary | ICD-10-CM

## 2022-12-27 NOTE — Telephone Encounter (Signed)
Requesting: alprazolam 0.25mg   Contract: 11/01/22 UDS: 11/01/22 Last Visit: 11/01/22 Next Visit None Last Refill: 11/01/22 #60 and 1RF   Please Advise

## 2022-12-29 ENCOUNTER — Encounter: Payer: Self-pay | Admitting: Family Medicine

## 2023-01-14 ENCOUNTER — Other Ambulatory Visit: Payer: Self-pay | Admitting: Family Medicine

## 2023-01-14 DIAGNOSIS — I10 Essential (primary) hypertension: Secondary | ICD-10-CM

## 2023-03-02 ENCOUNTER — Other Ambulatory Visit: Payer: Self-pay | Admitting: Family Medicine

## 2023-03-02 DIAGNOSIS — F32A Depression, unspecified: Secondary | ICD-10-CM

## 2023-03-03 NOTE — Telephone Encounter (Signed)
Requesting: alprazolam 0.25mg   Contract: 11/01/22 UDS: 11/01/22 Last Visit: 11/01/22 Next Visit None Last Refill: 12/27/2022 #60 and 1RF    Please Advise

## 2023-05-01 ENCOUNTER — Other Ambulatory Visit: Payer: Self-pay

## 2023-05-01 DIAGNOSIS — I10 Essential (primary) hypertension: Secondary | ICD-10-CM

## 2023-05-01 MED ORDER — METOPROLOL SUCCINATE ER 50 MG PO TB24
ORAL_TABLET | ORAL | 1 refills | Status: AC
Start: 2023-05-01 — End: ?

## 2023-05-09 ENCOUNTER — Other Ambulatory Visit: Payer: Self-pay

## 2023-05-16 ENCOUNTER — Encounter: Payer: Self-pay | Admitting: Family Medicine

## 2023-05-16 ENCOUNTER — Ambulatory Visit (INDEPENDENT_AMBULATORY_CARE_PROVIDER_SITE_OTHER): Payer: 59 | Admitting: Family Medicine

## 2023-05-16 VITALS — BP 134/78 | HR 70 | Temp 98.9°F | Resp 18 | Ht 66.0 in | Wt 196.8 lb

## 2023-05-16 DIAGNOSIS — I10 Essential (primary) hypertension: Secondary | ICD-10-CM

## 2023-05-16 DIAGNOSIS — A6000 Herpesviral infection of urogenital system, unspecified: Secondary | ICD-10-CM

## 2023-05-16 DIAGNOSIS — R739 Hyperglycemia, unspecified: Secondary | ICD-10-CM

## 2023-05-16 DIAGNOSIS — E039 Hypothyroidism, unspecified: Secondary | ICD-10-CM | POA: Diagnosis not present

## 2023-05-16 MED ORDER — LEVOTHYROXINE SODIUM 100 MCG PO TABS: ORAL_TABLET | ORAL | 3 refills | Status: DC

## 2023-05-16 MED ORDER — VALACYCLOVIR HCL 500 MG PO TABS
500.0000 mg | ORAL_TABLET | Freq: Every day | ORAL | 3 refills | Status: AC | PRN
Start: 2023-05-16 — End: ?

## 2023-05-16 NOTE — Patient Instructions (Signed)

## 2023-05-16 NOTE — Progress Notes (Signed)
 Established Patient Office Visit  Subjective   Patient ID: Norma Kelley, female    DOB: January 18, 1961  Age: 63 y.o. MRN: [id number]  Chief Complaint  Patient presents with   Hypertension   Hypothyroidism   Follow-up    HPI Discussed the use of AI scribe software for clinical note transcription with the patient, who gave verbal consent to proceed.  History of Present Illness   The patient, with a history of herpes and arthritis, presents for a routine follow-up. She reports a recent herpes outbreak, for which she had run out of her prescribed Valtrex  (valacyclovir ). She also expresses increased pain in her fingers, knees, and back, which she attributes to her arthritis. The patient notes that the cold weather seems to exacerbate the inflammation and pain in her hands. She also mentions a past back surgery performed approximately 13 years ago.  In addition, the patient reports a recent knee injury due to a twisting motion, causing pain in the affected leg. She also requests a refill of her levothyroxine  and valacyclovir  prescriptions. The patient denies any swelling in the ankles.      Patient Active Problem List   Diagnosis Date Noted   Hyperglycemia 11/01/2022   Dizziness 07/20/2021   Syncope 07/20/2021   Head trauma 07/20/2021   Depression, recurrent (HCC) 10/13/2020   GERD (gastroesophageal reflux disease)    Palpitations 05/17/2019   Essential hypertension 04/22/2019   Palpitation 04/22/2019   Low back pain 04/12/2018   Viral syndrome 04/12/2018   Hemorrhoids 09/23/2017   Vaginal itching 09/23/2017   Preventative health care 09/23/2017   Paronychia of left middle finger 06/26/2016   Meningitis 09/09/2015   Hypothyroidism 09/09/2015   Genetic testing 08/11/2015   Breast cancer (HCC) 07/30/2015   Precordial pain 04/02/2013   Insomnia 04/01/2013   Bronchitis, acute 08/10/2011   Back pain with radiation 10/13/2010   Hypothyroid 02/11/2008   URTICARIA 02/11/2008    Contact dermatitis and other eczema, due to unspecified cause 09/10/2007   ACUTE CYSTITIS 08/13/2007   Reactive depression 02/03/2007   BREAST CANCER, HX OF 02/03/2007   Anxiety state 09/06/2006   MASTECTOMY, LEFT, HX OF 09/06/2006   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   BREAST LUMPECTOMY WITH RADIOACTIVE SEED LOCALIZATION Right 08/17/2015   Procedure: BREAST LUMPECTOMY WITH RADIOACTIVE SEED LOCALIZATION;  Surgeon: Morene Olives, MD;  Location: [facility];  Service: General;  Laterality: Right;   BREAST SURGERY     Mastectomy and reconstruction   COLONOSCOPY     MASTECTOMY Left 2007   mastectomy and tamoxifen   POLYPECTOMY     TONSILLECTOMY     Social History   Tobacco Use   Smoking status: Former    Current packs/day: 1.00    Average packs/day: 1 pack/day for 20.0 years (20.0 ttl pk-yrs)    Types: Cigarettes   Smokeless tobacco: Never   Tobacco  comments:    Quit 14 years ago  Vaping Use   Vaping status: Never Used  Substance Use Topics   Alcohol use: Yes    Comment: Occasional   Drug use: No   Social History   Socioeconomic History   Marital status: Married    Spouse name: Not on file   Number of children: 3   Years of education: Not on file   Highest education level: Not on file  Occupational History    Employer: [employer]   Tobacco Use   Smoking status: Former    Current packs/day: 1.00    Average packs/day: 1 pack/day for 20.0 years (20.0 ttl pk-yrs)    Types:  Cigarettes   Smokeless tobacco: Never   Tobacco comments:    Quit 14 years ago  Vaping Use   Vaping status: Never Used  Substance and Sexual Activity   Alcohol use: Yes    Comment: Occasional   Drug use: No   Sexual activity: Yes  Other Topics Concern   Not on file  Social History Narrative   Lives with husband.  Occasional exercise.   Social Drivers of Corporate Investment Banker Strain: Not on file  Food Insecurity: Not on file  Transportation Needs: Not on file  Physical Activity: Not on file  Stress: Not on file  Social Connections: Not on file  Intimate Partner Violence: Not on file   Family Status  Relation Name Status   Mother  Alive   Father  Deceased at age 32   MGF  Deceased at age 79   Mat Uncle  Alive   Pat Aunt  Deceased   Pat Uncle x4 Deceased   MGM  Deceased at age 75   PGM  Deceased at age 60s   PGF  Deceased at age 55s   Mat Uncle  Alive   Son  Alive   Son  Alive   Daughter  Alive   Neg Hx  (Not Specified)  No partnership data on file   Family History  Problem Relation Age of Onset   Hyperlipidemia Mother    Osteoporosis Mother    Diabetes Mother    Heart attack Mother 88   Breast cancer Mother 13   Heart attack Father 53       MASSIVE MI,  Died age 9   Colon polyps Father    Colon cancer Maternal Grandfather        dx in late 60s-early 25s   Head & neck cancer Maternal Grandmother        cancer of the sinuses   Pancreatic cancer Neg Hx    Stomach cancer Neg Hx    Rectal cancer Neg Hx    Esophageal cancer Neg Hx    Allergies  Allergen Reactions   Sulfamethoxazole Itching    Swelling around eyes   Sulfonamide Derivatives       Review of Systems  Constitutional:  Negative for fever.  HENT:  Negative for congestion.   Eyes:  Negative for blurred vision.  Respiratory:  Negative for cough.   Cardiovascular:  Negative for chest pain and palpitations.  Gastrointestinal:  Negative for vomiting.  Musculoskeletal:  Negative for  back pain.  Skin:  Negative for rash.  Neurological:  Negative for loss of consciousness and headaches.      Objective:     BP 134/78 (BP Location: Left Arm, Patient Position: Sitting)   Pulse 70   Temp 98.9 F (37.2 C) (Oral)  Resp 18   Ht 5' 6 (1.676 m)   Wt 196 lb 12.8 oz (89.3 kg)   SpO2 98%   BMI 31.76 kg/m  BP Readings from Last 3 Encounters:  05/16/23 134/78  11/01/22 120/70  04/28/22 132/80   Wt Readings from Last 3 Encounters:  05/16/23 196 lb 12.8 oz (89.3 kg)  11/01/22 206 lb 6.4 oz (93.6 kg)  04/28/22 210 lb (95.3 kg)   SpO2 Readings from Last 3 Encounters:  05/16/23 98%  11/01/22 98%  04/28/22 97%      Physical Exam Vitals and nursing note reviewed.  Constitutional:      General: She is not in acute distress.    Appearance: Normal appearance. She is well-developed.  HENT:     Head: Normocephalic and atraumatic.  Eyes:     General: No scleral icterus.       Right eye: No discharge.        Left eye: No discharge.  Cardiovascular:     Rate and Rhythm: Normal rate and regular rhythm.     Heart sounds: No murmur heard. Pulmonary:     Effort: Pulmonary effort is normal. No respiratory distress.     Breath sounds: Normal breath sounds.  Musculoskeletal:        General: Normal range of motion.     Cervical back: Normal range of motion and neck supple.     Right lower leg: No edema.     Left lower leg: No edema.  Skin:    General: Skin is warm and dry.  Neurological:     Mental Status: She is alert and oriented to person, place, and time.  Psychiatric:        Mood and Affect: Mood normal.        Behavior: Behavior normal.        Thought Content: Thought content normal.        Judgment: Judgment normal.      No results found for any visits on 05/16/23.  Last CBC Lab Results  Component Value Date   WBC 6.2 11/01/2022   HGB 13.2 11/01/2022   HCT 39.6 11/01/2022   MCV 86.9 11/01/2022   MCH 29.3 09/10/2015   RDW 13.5 11/01/2022   PLT  265.0 11/01/2022   Last metabolic panel Lab Results  Component Value Date   GLUCOSE 89 11/01/2022   NA 138 11/01/2022   K 4.2 11/01/2022   CL 98 11/01/2022   CO2 30 11/01/2022   BUN 13 11/01/2022   CREATININE 0.79 11/01/2022   GFR 79.18 11/01/2022   CALCIUM 10.1 11/01/2022   PROT 7.3 11/01/2022   ALBUMIN 4.8 11/01/2022   BILITOT 0.8 11/01/2022   ALKPHOS 78 11/01/2022   AST 17 11/01/2022   ALT 16 11/01/2022   ANIONGAP 9 09/10/2015   Last lipids Lab Results  Component Value Date   CHOL 158 11/01/2022   HDL 48.20 11/01/2022   LDLCALC 91 11/01/2022   TRIG 92.0 11/01/2022   CHOLHDL 3 11/01/2022   Last hemoglobin A1c Lab Results  Component Value Date   HGBA1C 6.2 04/28/2022   Last thyroid  functions Lab Results  Component Value Date   TSH 1.20 11/01/2022   T4TOTAL 9.5 03/01/2021   Last vitamin D  Lab Results  Component Value Date   VD25OH 31 10/14/2011   Last vitamin B12 and Folate Lab Results  Component Value Date   VITAMINB12 329 07/20/2021   FOLATE 12.0 10/07/2008      The 10-year ASCVD risk  score (Arnett DK, et al., 2019) is: 6.8%    Assessment & Plan:   Problem List Items Addressed This Visit       Unprioritized   Hypothyroidism - Primary   Relevant Medications   levothyroxine  (SYNTHROID ) 100 MCG tablet   Other Relevant Orders   TSH   Hyperglycemia   Relevant Orders   Comprehensive metabolic panel   Other Visit Diagnoses       Genital herpes simplex, unspecified site       Relevant Medications   valACYclovir  (VALTREX ) 500 MG tablet     Primary hypertension       Relevant Orders   CBC with Differential/Platelet   Lipid panel     Assessment and Plan    Knee Pain   She reports twisting her knee, which has resulted in pain, though the severity and specific anatomical details were not discussed. We will consider further evaluation if the pain persists or worsens.  Osteoarthritis   Her arthritis pain in the fingers, knees, and back has  worsened, exacerbated by cold weather, with a history of back surgery approximately 12-13 years ago. We recommend Tylenol  Arthritis and suggest paraffin wax treatment for the hands, advising against ibuprofen due to safety concerns.  Hypothyroidism   Her condition is well-managed with ongoing medication, requiring a refill of levothyroxine  (Synthroid ). We will refill the levothyroxine  prescription.  Herpes Simplex Virus (HSV)   Both she and her spouse have herpes, experiencing occasional breakouts, and she recently had a breakout and ran out of valacyclovir  (Valtrex ). We will refill the valacyclovir  prescription.  General Health Maintenance   She did not receive a flu shot this year and opted to skip it. We advised wearing a mask in the waiting room due to the presence of sick individuals.  Follow-up   We will direct her to the lab for blood work.        Return in about 6 months (around 11/13/2023), or if symptoms worsen or fail to improve.    Amond Speranza R Lowne Chase, DO

## 2023-05-17 LAB — CBC WITH DIFFERENTIAL/PLATELET
Basophils Absolute: 0 10*3/uL (ref 0.0–0.1)
Basophils Relative: 0.6 % (ref 0.0–3.0)
Eosinophils Absolute: 0.2 10*3/uL (ref 0.0–0.7)
Eosinophils Relative: 2.7 % (ref 0.0–5.0)
HCT: 40 % (ref 36.0–46.0)
Hemoglobin: 13.5 g/dL (ref 12.0–15.0)
Lymphocytes Relative: 24.5 % (ref 12.0–46.0)
Lymphs Abs: 2.1 10*3/uL (ref 0.7–4.0)
MCHC: 33.7 g/dL (ref 30.0–36.0)
MCV: 88.6 fL (ref 78.0–100.0)
Monocytes Absolute: 0.4 10*3/uL (ref 0.1–1.0)
Monocytes Relative: 5.3 % (ref 3.0–12.0)
Neutro Abs: 5.7 10*3/uL (ref 1.4–7.7)
Neutrophils Relative %: 66.9 % (ref 43.0–77.0)
Platelets: 300 10*3/uL (ref 150.0–400.0)
RBC: 4.52 Mil/uL (ref 3.87–5.11)
RDW: 13.5 % (ref 11.5–15.5)
WBC: 8.5 10*3/uL (ref 4.0–10.5)

## 2023-05-17 LAB — LIPID PANEL
Cholesterol: 181 mg/dL (ref 0–200)
HDL: 54.4 mg/dL (ref >39.00)
LDL Cholesterol: 98 mg/dL (ref 0–99)
NonHDL: 127.06
Total CHOL/HDL Ratio: 3
Triglycerides: 145 mg/dL (ref 0.0–149.0)
VLDL: 29 mg/dL (ref 0.0–40.0)

## 2023-05-17 LAB — COMPREHENSIVE METABOLIC PANEL
ALT: 13 U/L (ref 0–35)
AST: 15 U/L (ref 0–37)
Albumin: 4.8 g/dL (ref 3.5–5.2)
Alkaline Phosphatase: 82 U/L (ref 39–117)
BUN: 17 mg/dL (ref 6–23)
CO2: 31 meq/L (ref 19–32)
Calcium: 9.9 mg/dL (ref 8.4–10.5)
Chloride: 99 meq/L (ref 96–112)
Creatinine, Ser: 0.71 mg/dL (ref 0.40–1.20)
GFR: 89.66 mL/min (ref >60.00)
Glucose, Bld: 88 mg/dL (ref 70–99)
Potassium: 4.1 meq/L (ref 3.5–5.1)
Sodium: 138 meq/L (ref 135–145)
Total Bilirubin: 0.6 mg/dL (ref 0.2–1.2)
Total Protein: 7.2 g/dL (ref 6.0–8.3)

## 2023-05-17 LAB — TSH: TSH: 2.09 u[IU]/mL (ref 0.35–5.50)

## 2023-06-10 ENCOUNTER — Encounter: Payer: Self-pay | Admitting: Family Medicine

## 2023-06-10 DIAGNOSIS — F32A Depression, unspecified: Secondary | ICD-10-CM

## 2023-06-12 NOTE — Telephone Encounter (Signed)
Requesting: xanax Contract:11/17/22 UDS:11/17/22 Last Visit:05/16/23 Next Visit:n/a Last Refill:03/03/23  Please Advise

## 2023-06-13 MED ORDER — ALPRAZOLAM 0.25 MG PO TABS: ORAL_TABLET | ORAL | 2 refills | Status: DC

## 2023-06-20 ENCOUNTER — Other Ambulatory Visit: Payer: Self-pay | Admitting: Family Medicine

## 2023-06-20 ENCOUNTER — Encounter: Payer: Self-pay | Admitting: Family Medicine

## 2023-06-20 DIAGNOSIS — T753XXA Motion sickness, initial encounter: Secondary | ICD-10-CM

## 2023-06-20 MED ORDER — SCOPOLAMINE 1 MG/3DAYS TD PT72: 1.0000 | MEDICATED_PATCH | TRANSDERMAL | 12 refills | Status: AC

## 2023-06-27 ENCOUNTER — Other Ambulatory Visit: Payer: Self-pay | Admitting: Family Medicine

## 2023-06-27 DIAGNOSIS — Z0000 Encounter for general adult medical examination without abnormal findings: Secondary | ICD-10-CM

## 2023-08-09 ENCOUNTER — Ambulatory Visit
Admission: RE | Admit: 2023-08-09 | Discharge: 2023-08-09 | Disposition: A | Discharge status: Home / Self Care | Payer: 59 | Source: Ambulatory Visit | Attending: Family Medicine | Admitting: Family Medicine

## 2023-08-09 DIAGNOSIS — Z0000 Encounter for general adult medical examination without abnormal findings: Secondary | ICD-10-CM

## 2023-08-10 ENCOUNTER — Other Ambulatory Visit: Payer: Self-pay | Admitting: Family Medicine

## 2023-08-10 DIAGNOSIS — N63 Unspecified lump in unspecified breast: Secondary | ICD-10-CM

## 2023-08-22 ENCOUNTER — Other Ambulatory Visit: Payer: Self-pay | Admitting: Family Medicine

## 2023-08-22 DIAGNOSIS — F32A Depression, unspecified: Secondary | ICD-10-CM

## 2023-08-22 NOTE — Telephone Encounter (Signed)
 Requesting: alprazolam 0.25mg   Contract:11/01/22 UDS: 11/01/22 Last Visit: 05/16/23 Next Visit: None Last Refill: 06/13/23 #60 and 2RF   Please Advise

## 2023-08-29 ENCOUNTER — Other Ambulatory Visit

## 2023-08-29 ENCOUNTER — Encounter

## 2023-09-04 DIAGNOSIS — Z20822 Contact with and (suspected) exposure to covid-19: Secondary | ICD-10-CM | POA: Diagnosis not present

## 2023-09-04 DIAGNOSIS — R051 Acute cough: Secondary | ICD-10-CM | POA: Diagnosis not present

## 2023-09-04 DIAGNOSIS — J029 Acute pharyngitis, unspecified: Secondary | ICD-10-CM | POA: Diagnosis not present

## 2023-09-04 DIAGNOSIS — R070 Pain in throat: Secondary | ICD-10-CM | POA: Diagnosis not present

## 2023-09-12 ENCOUNTER — Ambulatory Visit
Admission: RE | Admit: 2023-09-12 | Discharge: 2023-09-12 | Disposition: A | Discharge status: Home / Self Care | Source: Ambulatory Visit | Attending: Family Medicine | Admitting: Family Medicine

## 2023-09-12 DIAGNOSIS — Z853 Personal history of malignant neoplasm of breast: Secondary | ICD-10-CM | POA: Diagnosis not present

## 2023-09-12 DIAGNOSIS — N6322 Unspecified lump in the left breast, upper inner quadrant: Secondary | ICD-10-CM | POA: Diagnosis not present

## 2023-09-12 DIAGNOSIS — Z08 Encounter for follow-up examination after completed treatment for malignant neoplasm: Secondary | ICD-10-CM | POA: Diagnosis not present

## 2023-09-12 DIAGNOSIS — N63 Unspecified lump in unspecified breast: Secondary | ICD-10-CM

## 2023-09-13 DIAGNOSIS — H04123 Dry eye syndrome of bilateral lacrimal glands: Secondary | ICD-10-CM | POA: Diagnosis not present

## 2023-09-13 DIAGNOSIS — H2513 Age-related nuclear cataract, bilateral: Secondary | ICD-10-CM | POA: Diagnosis not present

## 2023-10-16 ENCOUNTER — Encounter: Payer: Self-pay | Admitting: Family Medicine

## 2023-10-16 ENCOUNTER — Other Ambulatory Visit: Payer: Self-pay | Admitting: Family Medicine

## 2023-10-16 DIAGNOSIS — F419 Anxiety disorder, unspecified: Secondary | ICD-10-CM

## 2023-10-20 ENCOUNTER — Ambulatory Visit (INDEPENDENT_AMBULATORY_CARE_PROVIDER_SITE_OTHER): Admitting: Family Medicine

## 2023-10-20 ENCOUNTER — Encounter: Payer: Self-pay | Admitting: Family Medicine

## 2023-10-20 VITALS — BP 110/78 | HR 61 | Temp 98.9°F | Resp 18 | Ht 66.0 in | Wt 204.8 lb

## 2023-10-20 DIAGNOSIS — E2839 Other primary ovarian failure: Secondary | ICD-10-CM

## 2023-10-20 DIAGNOSIS — I10 Essential (primary) hypertension: Secondary | ICD-10-CM | POA: Diagnosis not present

## 2023-10-20 DIAGNOSIS — Z79899 Other long term (current) drug therapy: Secondary | ICD-10-CM | POA: Diagnosis not present

## 2023-10-20 DIAGNOSIS — F419 Anxiety disorder, unspecified: Secondary | ICD-10-CM | POA: Diagnosis not present

## 2023-10-20 DIAGNOSIS — E039 Hypothyroidism, unspecified: Secondary | ICD-10-CM | POA: Diagnosis not present

## 2023-10-20 DIAGNOSIS — F32A Depression, unspecified: Secondary | ICD-10-CM | POA: Diagnosis not present

## 2023-10-20 MED ORDER — METOPROLOL SUCCINATE ER 50 MG PO TB24: ORAL_TABLET | ORAL | 1 refills | Status: DC

## 2023-10-20 MED ORDER — HYDROCHLOROTHIAZIDE 25 MG PO TABS: 25.0000 mg | ORAL_TABLET | Freq: Every day | ORAL | 3 refills | Status: AC

## 2023-10-20 MED ORDER — ALPRAZOLAM 0.25 MG PO TABS: ORAL_TABLET | ORAL | 1 refills | Status: DC

## 2023-10-20 NOTE — Progress Notes (Signed)
 Established Patient Office Visit  Subjective   Patient ID: Norma Kelley, female    DOB: [birth year]  Age: 63 y.o. MRN: [id number]  Chief Complaint  Patient presents with   Hypertension   Anxiety   Follow-up    HPI Discussed the use of AI scribe software for clinical note transcription with the patient, who gave verbal consent to proceed.  History of Present Illness   Norma Kelley is a 63 year old female who presents for a refill of Xanax  and inquiry about a bone density test.  She is seeking a refill for her Xanax  prescription. Her anxiety levels have decreased significantly since retiring, although she still experiences occasional anxiety 'moments that just hit me out of the blue.'  She inquires about the need for a bone density test, noting that she has never had one. It is revealed that she had a bone density test a long time ago, performed by Dr. Kendra Pavy, but she does not recall this.  Her current medications include Xanax , hydrochlorothiazide  (HCTZ), metoprolol , and Synthroid . She recently refilled her HCTZ for a 90-day supply and believes she has one refill left for both metoprolol  and Synthroid . The Synthroid  was filled for a year in January, and the HCTZ is good until September.  She has joined a gym, which is covered by her Medicare supplement, and hopes to lose weight but admits to poor eating habits. She recently turned 63 in April and retired at the end of April, which has significantly reduced her stress and anxiety levels.  No swelling in her ankles.      Patient Active Problem List   Diagnosis Date Noted   Hyperglycemia 11/01/2022   Dizziness 07/20/2021   Syncope 07/20/2021   Head trauma 07/20/2021   Depression, recurrent (HCC) 10/13/2020   GERD (gastroesophageal reflux disease)    Palpitations 05/17/2019   Essential hypertension 04/22/2019   Palpitation 04/22/2019   Low back pain 04/12/2018   Viral syndrome 04/12/2018   Hemorrhoids 09/23/2017   Vaginal  itching 09/23/2017   Preventative health care 09/23/2017   Paronychia of left middle finger 06/26/2016   Meningitis 09/09/2015   Hypothyroidism 09/09/2015   Genetic testing 08/11/2015   Breast cancer (HCC) 07/30/2015   Precordial pain 04/02/2013   Insomnia 04/01/2013   Bronchitis, acute 08/10/2011   Back pain with radiation 10/13/2010   Hypothyroid 02/11/2008   URTICARIA 02/11/2008   Contact dermatitis and other eczema, due to unspecified cause 09/10/2007   ACUTE CYSTITIS 08/13/2007   Reactive depression 02/03/2007   BREAST CANCER, HX OF 02/03/2007   Anxiety state 09/06/2006   MASTECTOMY, LEFT, HX OF 09/06/2006   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   BREAST LUMPECTOMY WITH RADIOACTIVE SEED LOCALIZATION Right 08/17/2015   Procedure: BREAST LUMPECTOMY WITH RADIOACTIVE SEED LOCALIZATION;  Surgeon: Ayesha Lente, MD;  [facility];  Service: General;  Laterality: Right;  BREAST SURGERY     Mastectomy and reconstruction   COLONOSCOPY     MASTECTOMY Left 2007   mastectomy and tamoxifen   POLYPECTOMY     TONSILLECTOMY     Social History   Tobacco Use   Smoking status: Former    Current packs/day: 1.00    Average packs/day: 1 pack/day for 20.0 years (20.0 ttl pk-yrs)    Types: Cigarettes   Smokeless tobacco: Never   Tobacco comments:    Quit 14 years ago  Vaping Use   Vaping status: Never Used  Substance Use Topics   Alcohol use: Yes    Comment: Occasional   Drug use: No    Social History   Socioeconomic History   Marital status: Married    Spouse name: Not on file   Number of children: 3   Years of education: Not on file   Highest education level: Not on file  Occupational History    Employer: [employer]  Tobacco Use   Smoking status: Former    Current packs/day: 1.00    Average packs/day: 1 pack/day for 20.0 years (20.0 ttl pk-yrs)    Types: Cigarettes   Smokeless tobacco: Never   Tobacco comments:    Quit 14 years ago  Vaping Use   Vaping status: Never Used  Substance and Sexual Activity   Alcohol use: Yes    Comment: Occasional   Drug use: No   Sexual activity: Yes  Other Topics Concern   Not on file  Social History Narrative   Lives with husband.  Occasional exercise.   Social Drivers of Corporate investment banker Strain: Not on file  Food Insecurity: Not on file  Transportation Needs: Not on file  Physical Activity: Not on file  Stress: Not on file  Social Connections: Not on file  Intimate Partner Violence: Not on file   Family Status  Relation Name Status   Mother  Alive   Father  Deceased at age 13   MGF  Deceased at age 22   Mat Uncle  Alive   Pat Aunt  Deceased   Pat Uncle x4 Deceased   MGM  Deceased at age 61   PGM  Deceased at age 46s   PGF  Deceased at age 15s   Mat Uncle  Alive   Son  Alive   Son  Alive   Daughter  Alive   Neg Hx  (Not Specified)  No partnership data on file   Family History  Problem Relation Age of Onset   Hyperlipidemia Mother    Osteoporosis Mother    Diabetes Mother    Heart attack Mother 27   Breast cancer Mother 47   Heart attack Father 29       MASSIVE MI,  Died age 48   Colon polyps Father    Colon cancer Maternal Grandfather        dx in late 60s-early 66s   Head & neck cancer Maternal Grandmother        cancer of the sinuses   Pancreatic cancer Neg Hx    Stomach cancer Neg Hx    Rectal cancer Neg Hx    Esophageal cancer Neg Hx    Allergies  Allergen Reactions    Sulfamethoxazole Itching    Swelling around eyes   Sulfonamide Derivatives       Review of Systems  Constitutional:  Negative for fever and malaise/fatigue.  HENT:  Negative for congestion.   Eyes:  Negative for blurred vision.  Respiratory:  Negative for cough and shortness of breath.   Cardiovascular:  Negative for chest pain, palpitations and leg swelling.  Gastrointestinal:  Negative for vomiting.  Musculoskeletal:  Negative for back pain.  Skin:  Negative for rash.  Neurological:  Negative for loss of consciousness and headaches.      Objective:     BP 110/78 (BP Location: Left Arm, Patient Position: Sitting, Cuff Size: Large)   Pulse 61   Temp 98.9 F (37.2 C) (Oral)   Resp 18   Ht 5' 6 (1.676 m)   Wt 204 lb 12.8 oz (92.9 kg)   SpO2 97%   BMI 33.06 kg/m  BP Readings from Last 3 Encounters:  10/20/23 110/78  05/16/23 134/78  11/01/22 120/70   Wt Readings from Last 3 Encounters:  10/20/23 204 lb 12.8 oz (92.9 kg)  05/16/23 196 lb 12.8 oz (89.3 kg)  11/01/22 206 lb 6.4 oz (93.6 kg)   SpO2 Readings from Last 3 Encounters:  10/20/23 97%  05/16/23 98%  11/01/22 98%      Physical Exam Vitals and nursing note reviewed.  Constitutional:      General: She is not in acute distress.    Appearance: Normal appearance. She is well-developed.  HENT:     Head: Normocephalic and atraumatic.   Eyes:     General: No scleral icterus.       Right eye: No discharge.        Left eye: No discharge.    Cardiovascular:     Rate and Rhythm: Normal rate and regular rhythm.     Heart sounds: No murmur heard. Pulmonary:     Effort: Pulmonary effort is normal. No respiratory distress.     Breath sounds: Normal breath sounds.   Musculoskeletal:        General: Normal range of motion.     Cervical back: Normal range of motion and neck supple.     Right lower leg: No edema.     Left lower leg: No edema.   Skin:    General: Skin is warm and dry.   Neurological:      General: No focal deficit present.     Mental Status: She is alert and oriented to person, place, and time.   Psychiatric:        Mood and Affect: Mood normal.        Behavior: Behavior normal.        Thought Content: Thought content normal.        Judgment: Judgment normal.      No results found for any visits on 10/20/23.  Last CBC Lab Results  Component Value Date   WBC 8.5 05/16/2023   HGB 13.5 05/16/2023   HCT 40.0 05/16/2023   MCV 88.6 05/16/2023   MCH 29.3 09/10/2015   RDW 13.5 05/16/2023   PLT 300.0 05/16/2023   Last metabolic panel Lab Results  Component Value Date   GLUCOSE 88 05/16/2023   NA 138 05/16/2023   K 4.1 05/16/2023   CL 99 05/16/2023   CO2 31 05/16/2023   BUN 17 05/16/2023   CREATININE 0.71 05/16/2023   GFR 89.66 05/16/2023   CALCIUM 9.9 05/16/2023   PROT 7.2 05/16/2023   ALBUMIN 4.8 05/16/2023   BILITOT 0.6 05/16/2023   ALKPHOS 82 05/16/2023   AST 15 05/16/2023   ALT 13 05/16/2023   ANIONGAP 9 09/10/2015   Last lipids Lab Results  Component Value Date  CHOL 181 05/16/2023   HDL 54.40 05/16/2023   LDLCALC 98 05/16/2023   TRIG 145.0 05/16/2023   CHOLHDL 3 05/16/2023   Last hemoglobin A1c Lab Results  Component Value Date   HGBA1C 6.2 04/28/2022   Last thyroid  functions Lab Results  Component Value Date   TSH 2.09 05/16/2023   T4TOTAL 9.5 03/01/2021   Last vitamin D  Lab Results  Component Value Date   VD25OH 31 10/14/2011   Last vitamin B12 and Folate Lab Results  Component Value Date   VITAMINB12 329 07/20/2021   FOLATE 12.0 10/07/2008      The 10-year ASCVD risk score (Arnett DK, et al., 2019) is: 5.3%    Assessment & Plan:   Problem List Items Addressed This Visit       Unprioritized   Hypothyroidism   Check labs  Con't synthroid         Relevant Medications   metoprolol  succinate (TOPROL -XL) 50 MG 24 hr tablet   Other Relevant Orders   TSH   Essential hypertension   Well controlled, no changes  to meds. Encouraged heart healthy diet such as the DASH diet and exercise as tolerated.        Relevant Medications   hydrochlorothiazide  (HYDRODIURIL ) 25 MG tablet   metoprolol  succinate (TOPROL -XL) 50 MG 24 hr tablet   Other Visit Diagnoses       Estrogen deficiency    -  Primary   Relevant Orders   DG Bone Density     Anxiety and depression       Relevant Medications   ALPRAZolam  (XANAX ) 0.25 MG tablet     Primary hypertension       Relevant Medications   hydrochlorothiazide  (HYDRODIURIL ) 25 MG tablet   metoprolol  succinate (TOPROL -XL) 50 MG 24 hr tablet   Other Relevant Orders   CBC with Differential/Platelet   Comprehensive metabolic panel with GFR   Lipid panel     High risk medication use       Relevant Orders   Drug Monitoring Panel [phone] , Urine     Assessment and Plan    Anxiety   Her anxiety levels have decreased since retiring, with only occasional episodes. She is currently on Xanax  and is interested in reducing or discontinuing its use in the future. Refill the Xanax  prescription and discuss the potential for reducing its usage.  Hypertension   Her blood pressure is well-controlled with metoprolol  and hydrochlorothiazide . She recently refilled hydrochlorothiazide  with a 90-day supply. Refill the metoprolol  prescription when the current supply runs out.  Hypothyroidism   She is on Synthroid , with a prescription filled for a year in January. No immediate refill is needed.  Osteoporosis Screening   At age 38, she inquired about a bone density test, which is appropriate for osteoporosis screening. Order the bone density test.  General Health Maintenance   She is eligible for a Welcome to Medicare visit and a physical exam, which include depression screening, fall risk assessment, and vision screening. Encourage scheduling these visits within the first 12 months of Medicare enrollment.  Follow-up   She is advised to follow up with the breast center to schedule  her mammogram, as the order has been placed. Instruct her to call the breast center to schedule the mammogram.        Return in 3 months (on 01/20/2024), or if symptoms worsen or fail to improve, for annual exam, fasting--- welcome to medicare .    Norma Sanderlin R Lowne Chase, DO

## 2023-10-20 NOTE — Assessment & Plan Note (Signed)
Check labs  Con't synthroid 

## 2023-10-20 NOTE — Patient Instructions (Signed)

## 2023-10-20 NOTE — Assessment & Plan Note (Signed)
 Well controlled, no changes to meds. Encouraged heart healthy diet such as the DASH diet and exercise as tolerated.

## 2023-10-21 LAB — LIPID PANEL
Cholesterol: 171 mg/dL (ref <200)
HDL: 66 mg/dL (ref >=50)
LDL Cholesterol (Calc): 86 mg/dL
Non-HDL Cholesterol (Calc): 105 mg/dL (ref <130)
Total CHOL/HDL Ratio: 2.6 (calc) (ref <5.0)
Triglycerides: 99 mg/dL (ref <150)

## 2023-10-21 LAB — CBC WITH DIFFERENTIAL/PLATELET
Absolute Lymphocytes: 1930 {cells}/uL (ref 850–3900)
Absolute Monocytes: 570 {cells}/uL (ref 200–950)
Basophils Absolute: 53 {cells}/uL (ref 0–200)
Basophils Relative: 0.7 %
Eosinophils Absolute: 220 {cells}/uL (ref 15–500)
Eosinophils Relative: 2.9 %
HCT: 42.1 % (ref 35.0–45.0)
Hemoglobin: 13.4 g/dL (ref 11.7–15.5)
MCH: 28.8 pg (ref 27.0–33.0)
MCHC: 31.8 g/dL — ABNORMAL LOW (ref 32.0–36.0)
MCV: 90.5 fL (ref 80.0–100.0)
MPV: 11 fL (ref 7.5–12.5)
Monocytes Relative: 7.5 %
Neutro Abs: 4826 {cells}/uL (ref 1500–7800)
Neutrophils Relative %: 63.5 %
Platelets: 306 10*3/uL (ref 140–400)
RBC: 4.65 10*6/uL (ref 3.80–5.10)
RDW: 13 % (ref 11.0–15.0)
Total Lymphocyte: 25.4 %
WBC: 7.6 10*3/uL (ref 3.8–10.8)

## 2023-10-21 LAB — COMPREHENSIVE METABOLIC PANEL WITH GFR
AG Ratio: 2.2 (calc) (ref 1.0–2.5)
ALT: 16 U/L (ref 6–29)
AST: 15 U/L (ref 10–35)
Albumin: 4.8 g/dL (ref 3.6–5.1)
Alkaline phosphatase (APISO): 81 U/L (ref 37–153)
BUN: 13 mg/dL (ref 7–25)
CO2: 28 mmol/L (ref 20–32)
Calcium: 9.7 mg/dL (ref 8.6–10.4)
Chloride: 101 mmol/L (ref 98–110)
Creat: 0.75 mg/dL (ref 0.50–1.05)
Globulin: 2.2 g/dL (ref 1.9–3.7)
Glucose, Bld: 88 mg/dL (ref 65–99)
Potassium: 4.3 mmol/L (ref 3.5–5.3)
Sodium: 140 mmol/L (ref 135–146)
Total Bilirubin: 0.5 mg/dL (ref 0.2–1.2)
Total Protein: 7 g/dL (ref 6.1–8.1)
eGFR: 88 mL/min/{1.73_m2} (ref >=60)

## 2023-10-21 LAB — TSH: TSH: 3.77 m[IU]/L (ref 0.40–4.50)

## 2023-10-22 LAB — DRUG MONITORING PANEL 376104, URINE

## 2023-10-22 LAB — DM TEMPLATE

## 2023-10-28 ENCOUNTER — Ambulatory Visit: Payer: Self-pay | Admitting: Family Medicine

## 2023-11-14 IMAGING — MG MM DIGITAL SCREENING UNILAT*R* W/ TOMO W/ CAD
4 series · 4 of 12 positions shown · non-contrast
Comparison: Previous exam(s).

CLINICAL DATA: Screening. Status post LEFT mastectomy and RIGHT
excisional biopsy for LCIS

EXAM:
DIGITAL SCREENING UNILATERAL RIGHT MAMMOGRAM WITH CAD AND
TOMOSYNTHESIS
TECHNIQUE: Right screening digital craniocaudal and mediolateral oblique
mammograms were obtained. Right screening digital breast
tomosynthesis was performed. The images were evaluated with
computer-aided detection.

[R MLO synth-2D]
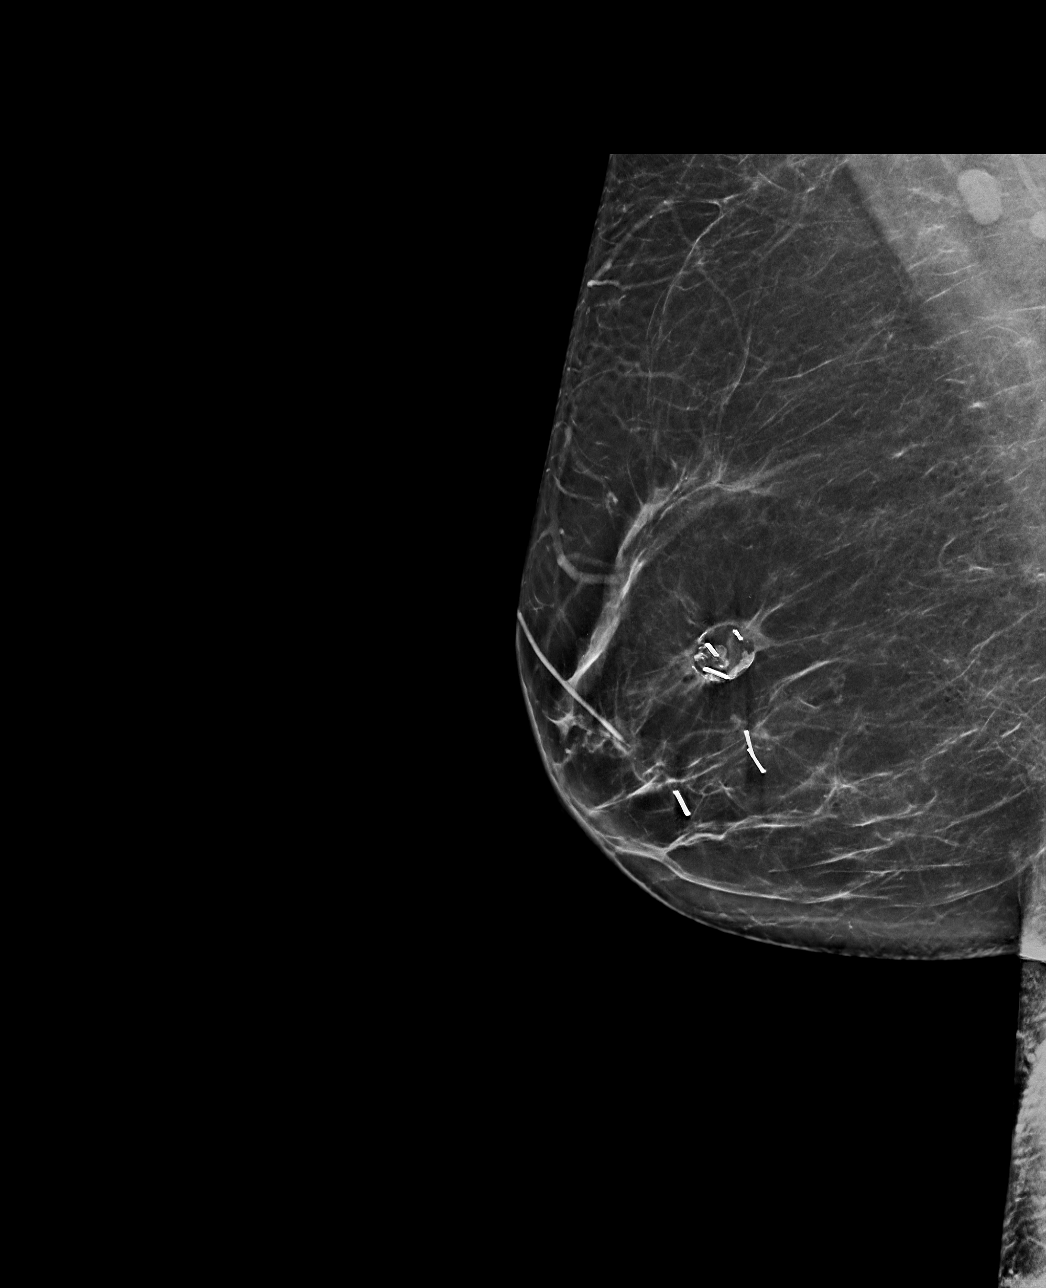

[R CC synth-2D]
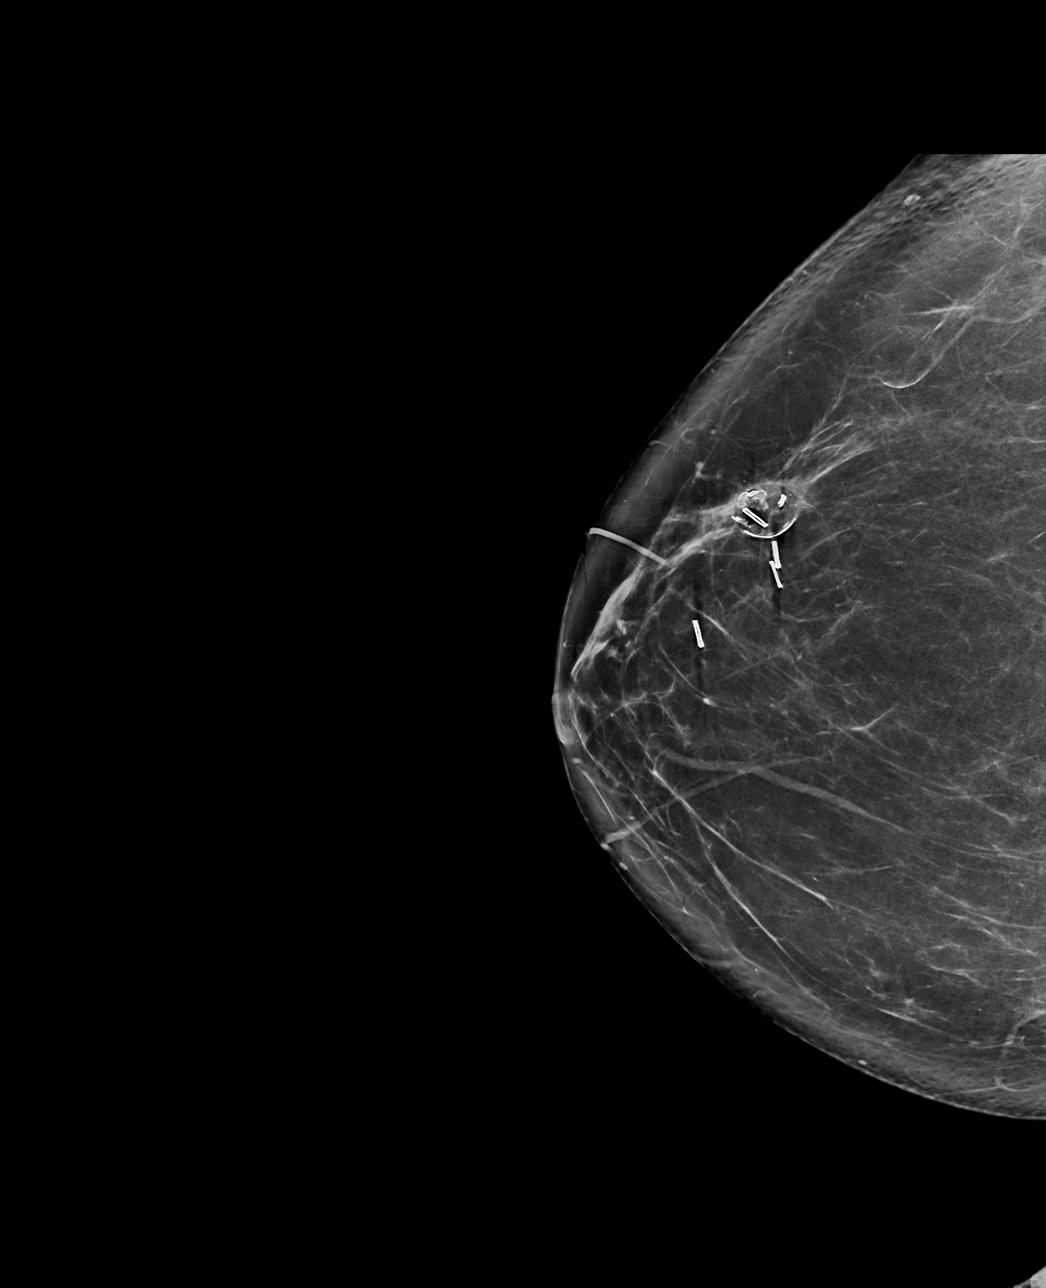

[R MLO tomo · tomo slice 43/86.0]
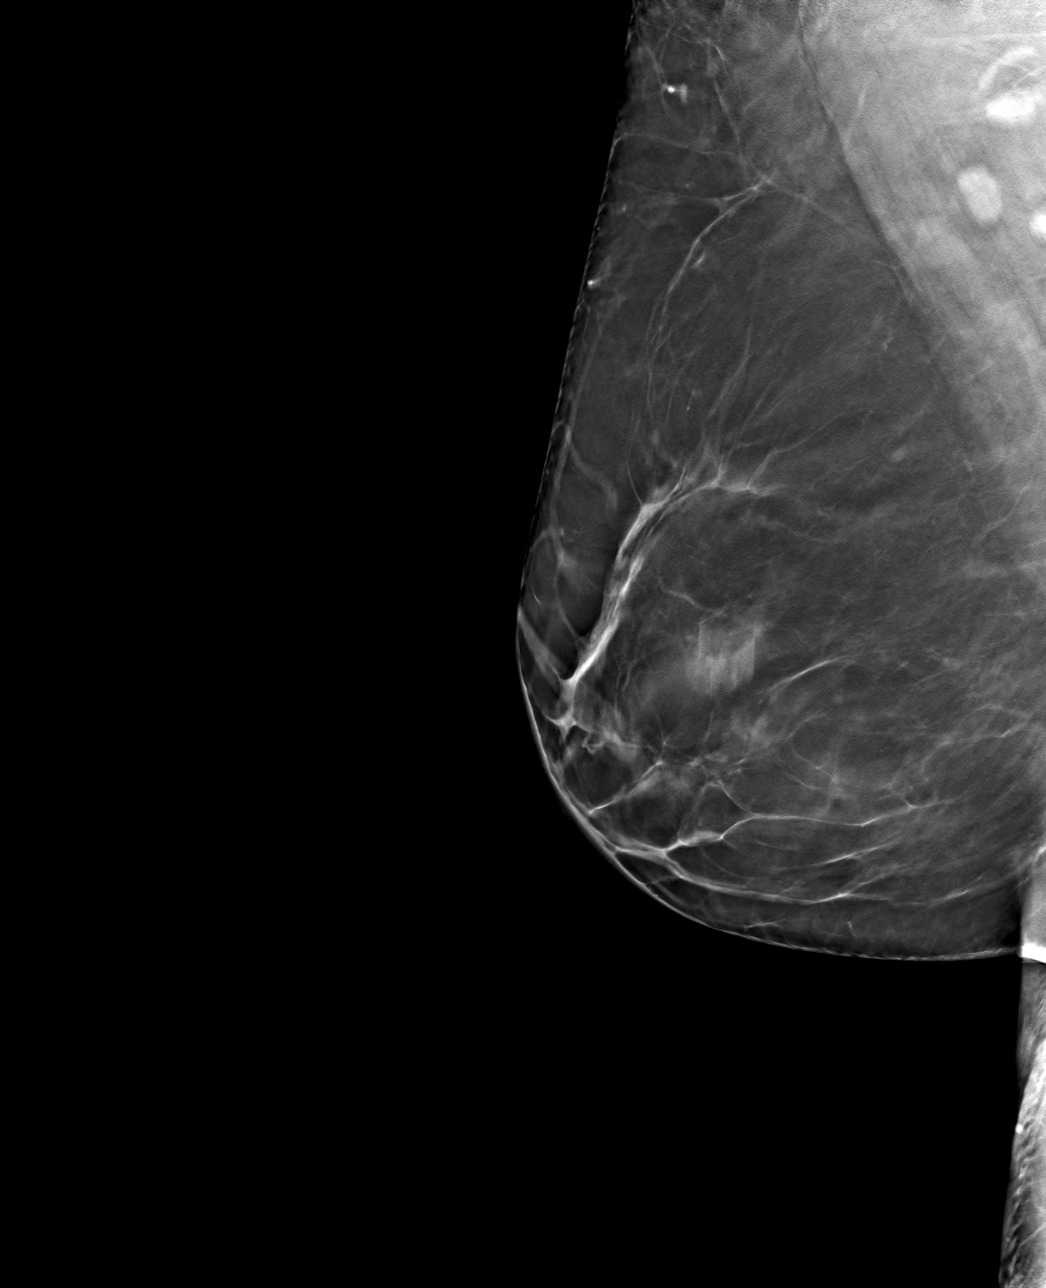

[R CC tomo · tomo slice 41/81.0]
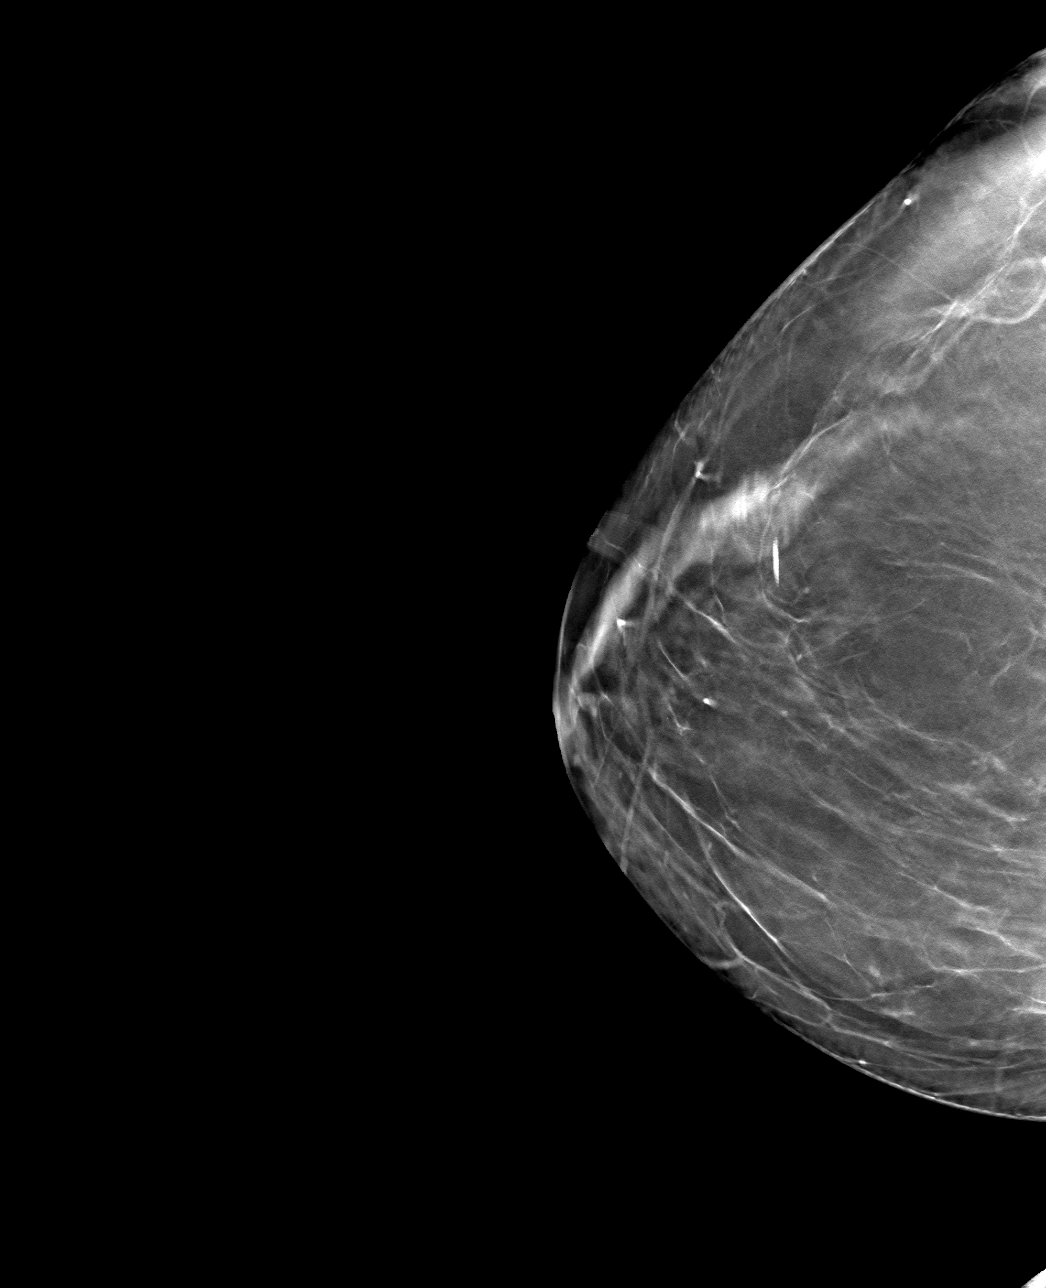

[4 of 12 positions shown; findings below may reference images not displayed]

ACR Breast Density Category b: There are scattered areas of
fibroglandular density.
FINDINGS: There are no findings suspicious for malignancy. There is density
and architectural distortion within the RIGHT breast, consistent
with postsurgical changes. These are stable in comparison to prior.
Status post LEFT mastectomy.
IMPRESSION: No mammographic evidence of malignancy. A result letter of this
screening mammogram will be mailed directly to the patient.

RECOMMENDATION:
Screening mammogram in one year. (Code:Z9-U-3QG)

BI-RADS CATEGORY  2: Benign.

## 2023-12-05 ENCOUNTER — Other Ambulatory Visit: Payer: Self-pay | Admitting: Family Medicine

## 2023-12-05 DIAGNOSIS — F419 Anxiety disorder, unspecified: Secondary | ICD-10-CM

## 2023-12-05 NOTE — Telephone Encounter (Signed)
 Name of Medication: Alprazolam  0.25mg  Name of Pharmacy: [id number] [place], [place] - [address] AT [address]EC OF [address]   Last Fill or Written Date and Quantity: 10/20/23 60tab 1refill Last Office Visit and Type: 10/20/23 for follow up Next Office Visit and Type: none Last Controlled Substance Agreement Date: 01/21/19 Last UDS: 10/20/23

## 2024-01-02 ENCOUNTER — Telehealth: Payer: Self-pay | Admitting: Cardiology

## 2024-01-02 ENCOUNTER — Other Ambulatory Visit: Payer: Self-pay | Admitting: Family Medicine

## 2024-01-02 DIAGNOSIS — F32A Depression, unspecified: Secondary | ICD-10-CM

## 2024-01-02 NOTE — Telephone Encounter (Signed)
 Requesting: Xanax  Contract: 10/20/2023 UDS: 10/20/2023 Last OV: 10/20/2023 Next OV: n/a Last Refill: 12/05/2023, #60--0 RF Database:   Please advise

## 2024-01-02 NOTE — Telephone Encounter (Signed)
 Returning pt's phone call regarding Palpitations.  The only other symptom she reports is every now and then a bit of lightheadedness. Denies CP, SOB/DOE. She has had these palpitations since 2020, and at that time they were very frequent. She then saw Dr. Pietro, and many tests were performed, all were fine, per pt. For the past 2-3 months, the palpitations have returned and are very bothersome to pt. She has been staying hydrated, although she admits she could do better. Avoids caffeine and alcohol. No recent illness/flu-like symptoms.   She is taking : hydrochlorothiazide  (HYDRODIURIL ) 25 MG tablet Take 1 tablet (25 mg total) by mouth daily.     metoprolol  succinate (TOPROL -XL) 50 MG 24 hr tablet Take 1 tablet (50 mg total) by mouth daily.   Omeprazole 20 MG TBEC Take 1 tablet by mouth daily.   Will forward to Scheduling team for APP appointment as Dr. Vertie first opening is 04/02/24.

## 2024-01-02 NOTE — Telephone Encounter (Signed)
 Patient c/o Palpitations: STAT if patient c/o lightheadedness, shortness of breath, or chest pain  How long have you had palpitations/irregular HR/ Afib? Are you having the symptoms now? Palpitations for 2 to 3 months and no  Are you currently experiencing lightheadedness, SOB or CP? No  Do you have a history of afib (atrial fibrillation) or irregular heart rhythm? No  Have you checked your BP or HR? (document readings if available): No  Are you experiencing any other symptoms? No.

## 2024-01-09 ENCOUNTER — Ambulatory Visit
Admission: RE | Admit: 2024-01-09 | Discharge: 2024-01-09 | Disposition: A | Discharge status: Home / Self Care | Source: Ambulatory Visit | Attending: Family Medicine | Admitting: Family Medicine

## 2024-01-09 DIAGNOSIS — E2839 Other primary ovarian failure: Secondary | ICD-10-CM | POA: Insufficient documentation

## 2024-01-09 DIAGNOSIS — M8589 Other specified disorders of bone density and structure, multiple sites: Secondary | ICD-10-CM | POA: Diagnosis not present

## 2024-01-09 DIAGNOSIS — Z780 Asymptomatic menopausal state: Secondary | ICD-10-CM | POA: Diagnosis not present

## 2024-02-01 DIAGNOSIS — K080 Exfoliation of teeth due to systemic causes: Secondary | ICD-10-CM | POA: Diagnosis not present

## 2024-02-02 DIAGNOSIS — H0015 Chalazion left lower eyelid: Secondary | ICD-10-CM | POA: Diagnosis not present

## 2024-02-02 DIAGNOSIS — H04123 Dry eye syndrome of bilateral lacrimal glands: Secondary | ICD-10-CM | POA: Diagnosis not present

## 2024-02-02 DIAGNOSIS — H2513 Age-related nuclear cataract, bilateral: Secondary | ICD-10-CM | POA: Diagnosis not present

## 2024-02-22 ENCOUNTER — Other Ambulatory Visit: Payer: Self-pay | Admitting: Family Medicine

## 2024-02-22 DIAGNOSIS — F32A Depression, unspecified: Secondary | ICD-10-CM

## 2024-02-22 NOTE — Telephone Encounter (Signed)
 Requesting: alprazolam  0.25mg   Contract:11/17/22 UDS: 10/20/23 Last Visit: 10/20/23 Next Visit: None Last Refill: 01/02/24 #60 and 1RF  Please Advise

## 2024-03-20 ENCOUNTER — Other Ambulatory Visit: Payer: Self-pay | Admitting: Family

## 2024-03-20 DIAGNOSIS — F32A Depression, unspecified: Secondary | ICD-10-CM

## 2024-03-26 ENCOUNTER — Encounter: Payer: Self-pay | Admitting: Family Medicine

## 2024-03-26 DIAGNOSIS — F419 Anxiety disorder, unspecified: Secondary | ICD-10-CM

## 2024-03-26 MED ORDER — ALPRAZOLAM 0.25 MG PO TABS: ORAL_TABLET | ORAL | 1 refills | Status: DC

## 2024-03-26 NOTE — Telephone Encounter (Signed)
 Requesting: alprazolam  0.25mg   Contract: 11/17/22 UDS: 10/20/23 Last Visit: 10/20/23 Next Visit: 04/02/24 Last Refill: 02/22/24 #60 and 0RF   Please Advise

## 2024-03-26 NOTE — Telephone Encounter (Signed)
 I have sent in the xanax  I don't know why it was refused

## 2024-03-29 ENCOUNTER — Encounter: Admitting: Family Medicine

## 2024-04-02 ENCOUNTER — Ambulatory Visit: Admitting: Family Medicine

## 2024-04-02 ENCOUNTER — Encounter: Payer: Self-pay | Admitting: Family Medicine

## 2024-04-02 VITALS — BP 110/78 | HR 70 | Temp 98.4°F | Resp 16 | Ht 66.0 in | Wt 203.6 lb

## 2024-04-02 DIAGNOSIS — E890 Postprocedural hypothyroidism: Secondary | ICD-10-CM

## 2024-04-02 DIAGNOSIS — F419 Anxiety disorder, unspecified: Secondary | ICD-10-CM | POA: Diagnosis not present

## 2024-04-02 DIAGNOSIS — E039 Hypothyroidism, unspecified: Secondary | ICD-10-CM | POA: Diagnosis not present

## 2024-04-02 DIAGNOSIS — I10 Essential (primary) hypertension: Secondary | ICD-10-CM | POA: Diagnosis not present

## 2024-04-02 DIAGNOSIS — Z0000 Encounter for general adult medical examination without abnormal findings: Secondary | ICD-10-CM

## 2024-04-02 DIAGNOSIS — R739 Hyperglycemia, unspecified: Secondary | ICD-10-CM

## 2024-04-02 LAB — COMPREHENSIVE METABOLIC PANEL WITH GFR
ALT: 16 U/L (ref 0–35)
AST: 19 U/L (ref 0–37)
Albumin: 4.9 g/dL (ref 3.5–5.2)
Alkaline Phosphatase: 69 U/L (ref 39–117)
BUN: 13 mg/dL (ref 6–23)
CO2: 33 meq/L — ABNORMAL HIGH (ref 19–32)
Calcium: 10 mg/dL (ref 8.4–10.5)
Chloride: 98 meq/L (ref 96–112)
Creatinine, Ser: 0.72 mg/dL (ref 0.40–1.20)
GFR: 87.62 mL/min (ref >60.00)
Glucose, Bld: 87 mg/dL (ref 70–99)
Potassium: 4.1 meq/L (ref 3.5–5.1)
Sodium: 137 meq/L (ref 135–145)
Total Bilirubin: 0.8 mg/dL (ref 0.2–1.2)
Total Protein: 7.3 g/dL (ref 6.0–8.3)

## 2024-04-02 LAB — CBC WITH DIFFERENTIAL/PLATELET
Basophils Absolute: 0.1 K/uL (ref 0.0–0.1)
Basophils Relative: 0.7 % (ref 0.0–3.0)
Eosinophils Absolute: 0.2 K/uL (ref 0.0–0.7)
Eosinophils Relative: 3.1 % (ref 0.0–5.0)
HCT: 40.1 % (ref 36.0–46.0)
Hemoglobin: 13.6 g/dL (ref 12.0–15.0)
Lymphocytes Relative: 25.8 % (ref 12.0–46.0)
Lymphs Abs: 2 K/uL (ref 0.7–4.0)
MCHC: 33.9 g/dL (ref 30.0–36.0)
MCV: 86.6 fl (ref 78.0–100.0)
Monocytes Absolute: 0.6 K/uL (ref 0.1–1.0)
Monocytes Relative: 7.2 % (ref 3.0–12.0)
Neutro Abs: 4.9 K/uL (ref 1.4–7.7)
Neutrophils Relative %: 63.2 % (ref 43.0–77.0)
Platelets: 318 K/uL (ref 150.0–400.0)
RBC: 4.63 Mil/uL (ref 3.87–5.11)
RDW: 13.6 % (ref 11.5–15.5)
WBC: 7.8 K/uL (ref 4.0–10.5)

## 2024-04-02 LAB — LIPID PANEL
Cholesterol: 172 mg/dL (ref 0–200)
HDL: 62.4 mg/dL (ref >39.00)
LDL Cholesterol: 91 mg/dL (ref 0–99)
NonHDL: 109.85
Total CHOL/HDL Ratio: 3
Triglycerides: 95 mg/dL (ref 0.0–149.0)
VLDL: 19 mg/dL (ref 0.0–40.0)

## 2024-04-02 LAB — TSH: TSH: 2.69 u[IU]/mL (ref 0.35–5.50)

## 2024-04-02 LAB — HEMOGLOBIN A1C: Hgb A1c MFr Bld: 5.9 % (ref 4.6–6.5)

## 2024-04-02 NOTE — Assessment & Plan Note (Signed)
 Well controlled, no changes to meds. Encouraged heart healthy diet such as the DASH diet and exercise as tolerated.

## 2024-04-02 NOTE — Assessment & Plan Note (Signed)
 Check labs

## 2024-04-02 NOTE — Progress Notes (Addendum)
 Chief Complaint  Patient presents with   Annual Exam    Pt states not fasting   Discussed the use of AI scribe software for clinical note transcription with the patient, who gave verbal consent to proceed.  History of Present Illness Norma Kelley is a 63 year old female who presents for a Medicare physical exam.  She does not require any medication refills at this time, as her prescriptions for Xanax , blood pressure, and thyroid  medications have been recently refilled. She maintains an active lifestyle, attending a gym three times a week and participating in [employer] and yoga classes.  She has not received a pneumonia vaccine. Her mother received it more frequently due to diabetes, and she is considering whether to get the vaccine herself.  No depression or memory problems. She states 'life is good now' following retirement. She wears glasses for driving and has 79/79 vision with them for distance, but not for close-up work.  She experiences occasional knee pain, for which she sometimes wears a brace. It is not severe enough to seek orthopedic consultation.  She has been discharged from regular follow-ups with Dr. Pietro after her last round of tests. She is looking for a new dermatologist in [place] after her previous doctor moved to [place].     Subjective:   Norma Kelley is a 63 y.o. female who presents for a Welcome to Medicare Exam.   Allergies (verified) Sulfamethoxazole and Sulfonamide derivatives   History: Past Medical History:  Diagnosis Date   Allergy    seasonal   Anxiety    Arthritis    fingers   Breast cancer (HCC) 2007   left breast cancer   Depression    Diet-controlled diabetes mellitus (HCC)    pt. 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   BREAST LUMPECTOMY WITH RADIOACTIVE SEED LOCALIZATION Right 08/17/2015   Procedure: BREAST LUMPECTOMY WITH RADIOACTIVE SEED LOCALIZATION;  Surgeon: Morene Olives, MD;  Location: [facility];  Service: General;  Laterality: Right;   BREAST SURGERY     Mastectomy and reconstruction   COLONOSCOPY     MASTECTOMY Left 2007   mastectomy and tamoxifen   POLYPECTOMY     TONSILLECTOMY     Family History  Problem Relation Age of Onset   Hyperlipidemia Mother    Osteoporosis Mother    Diabetes Mother    Heart attack Mother 36   Breast cancer Mother 32   Heart attack Father 59       MASSIVE MI,  Died age 58   Colon polyps Father    Colon cancer Maternal Grandfather        dx in late 60s-early 34s   Head & neck cancer Maternal Grandmother        cancer of the sinuses   Pancreatic cancer Neg Hx    Stomach cancer Neg Hx    Rectal cancer Neg Hx    Esophageal cancer Neg Hx    Social History   Occupational History    Employer: [employer]  Tobacco Use   Smoking status: Former    Current packs/day: 1.00    Average packs/day: 1 pack/day for 20.0 years (20.0 ttl pk-yrs)    Types:  Cigarettes   Smokeless tobacco: Never   Tobacco comments:    Quit 14 years ago  Vaping Use   Vaping status: Never Used  Substance and Sexual Activity   Alcohol use: Yes    Comment: Occasional   Drug use: No   Sexual activity: Yes   Tobacco Counseling Counseling given: Not Answered Tobacco comments: Quit 14 years ago  SDOH Screenings   Food Insecurity: No Food Insecurity (04/01/2024)  Housing: Low Risk (04/01/2024)  Transportation Needs: No Transportation Needs (04/01/2024)  Alcohol Screen: Low Risk (04/01/2024)  Depression (PHQ2-9): Low Risk (04/02/2024)  Financial Resource Strain: Low Risk (04/01/2024)  Physical Activity: Sufficiently Active (04/01/2024)  Social Connections: Moderately Isolated (04/01/2024)  Stress: No Stress Concern Present (04/01/2024)   Tobacco Use: Medium Risk (04/02/2024)   See flowsheets for full screening details  Depression Screen PHQ 2 & 9 Depression Scale- Over the past 2 weeks, how often have you been bothered by any of the following problems? Little interest or pleasure in doing things: 0 Feeling down, depressed, or hopeless (PHQ Adolescent also includes...irritable): 0 PHQ-2 Total Score: 0 Trouble falling or staying asleep, or sleeping too much: 0 Feeling tired or having little energy: 0 Poor appetite or overeating (PHQ Adolescent also includes...weight loss): 0 Feeling bad about yourself - or that you are a failure or have let yourself or your family down: 0 Trouble concentrating on things, such as reading the newspaper or watching television (PHQ Adolescent also includes...like school work): 0 Moving or speaking so slowly that other people could have noticed. Or the opposite - being so fidgety or restless that you have been moving around a lot more than usual: 0 Thoughts that you would be better off dead, or of hurting yourself in some way: 0 PHQ-9 Total Score: 0 If you checked off any problems, how difficult have these problems made it for you to do your work, take care of things at home, or get along with other people?: Not difficult at all      Goals Addressed   None    Fall Screening Falls in the past year?: 0 Number of falls in past year: 0 Was there an injury with Fall?: 0 Fall Risk Category Calculator: 0 Patient Fall Risk Level: Low Fall Risk  Fall Risk Fall risk Follow up: Falls evaluation completed  Advance Directives (For Healthcare) Does Patient Have a Medical Advance Directive?: Yes Does patient want to make changes to medical advance directive?: No - Patient declined Type of Advance Directive: Healthcare Power of [place]; Living will Copy of Healthcare Power of Attorney in Chart?: No - copy requested Copy of Living Will in Chart?: No - copy requested         Objective:     Today's Vitals   04/02/24 1056  BP: 110/78  Pulse: 70  Resp: 16  Temp: 98.4 F (36.9 C)  TempSrc: Oral  SpO2: 98%  Weight: 203 lb 9.6 oz (92.4 kg)  Height: 5' 6 (1.676 m)   Body mass index is 32.86 kg/m.   Physical Exam Vitals and nursing note reviewed.  Constitutional:      General: She is not in acute distress.    Appearance: Normal appearance. She is well-developed.  HENT:     Head: Normocephalic and atraumatic.     Right Ear: Tympanic membrane, ear canal and external ear normal. There is no impacted cerumen.     Left Ear: Tympanic membrane, ear canal and external ear normal. There is no impacted cerumen.  Nose: Nose normal.     Mouth/Throat:     Mouth: Mucous membranes are moist.     Pharynx: Oropharynx is clear. No oropharyngeal exudate or posterior oropharyngeal erythema.  Eyes:     General: No scleral icterus.       Right eye: No discharge.        Left eye: No discharge.     Conjunctiva/sclera: Conjunctivae normal.     Pupils: Pupils are equal, round, and reactive to light.  Neck:     Thyroid : No thyromegaly or thyroid  tenderness.     Vascular: No JVD.  Cardiovascular:     Rate and Rhythm: Normal rate and regular rhythm.     Heart sounds: Normal heart sounds. No murmur heard. Pulmonary:     Effort: Pulmonary effort is normal. No respiratory distress.     Breath sounds: Normal breath sounds.  Abdominal:     General: Bowel sounds are normal. There is no distension.     Palpations: Abdomen is soft. There is no mass.     Tenderness: There is no abdominal tenderness. There is no guarding or rebound.  Genitourinary:    Vagina: Normal.  Musculoskeletal:        General: Normal range of motion.     Cervical back: Normal range of motion and neck supple.     Right lower leg: No edema.     Left lower leg: No edema.  Lymphadenopathy:     Cervical: No cervical adenopathy.  Skin:    General: Skin is warm and dry.     Findings: No erythema or rash.   Neurological:     Mental Status: She is alert and oriented to person, place, and time.     Cranial Nerves: No cranial nerve deficit.     Deep Tendon Reflexes: Reflexes are normal and symmetric.  Psychiatric:        Mood and Affect: Mood normal.        Behavior: Behavior normal.        Thought Content: Thought content normal.        Judgment: Judgment normal.   {      Current Medications (verified) Outpatient Encounter Medications as of 04/02/2024  Medication Sig   ALPRAZolam  (XANAX ) 0.25 MG tablet TAKE 1 TABLET(0.25 MG) BY MOUTH THREE TIMES DAILY AS NEEDED FOR ANXIETY   hydrochlorothiazide  (HYDRODIURIL ) 25 MG tablet Take 1 tablet (25 mg total) by mouth daily.   levothyroxine  (SYNTHROID ) 100 MCG tablet Take 1 tablet by mouth daily   metoprolol  succinate (TOPROL -XL) 50 MG 24 hr tablet TAKE 1 TABLET(50 MG) BY MOUTH DAILY   Omeprazole 20 MG TBEC Take 1 tablet by mouth daily.   scopolamine  (TRANSDERM-SCOP) 1 MG/3DAYS Place 1 patch (1.5 mg total) onto the skin every 3 (three) days.   valACYclovir  (VALTREX ) 500 MG tablet Take 1 tablet (500 mg total) by mouth daily as needed.   [DISCONTINUED] meloxicam  (MOBIC ) 15 MG tablet Take 1 tablet (15 mg total) by mouth daily.   No facility-administered encounter medications on file as of 04/02/2024.   Hearing/Vision screen Vision Screening   Right eye Left eye Both eyes  Without correction     With correction 20/20 20/20 20/20   Hearing Screening - Comments:: Normal whisper  Immunizations and Health Maintenance Health Maintenance  Topic Date Due   Medicare Annual Wellness (AWV)  Never done   DTaP/Tdap/Td (2 - Td or Tdap) 12/21/2022   Mammogram  08/08/2023   COVID-19 Vaccine (3 - 2025-26 season)  01/08/2024   Influenza Vaccine  08/06/2024 (Originally 12/08/2023)   Pneumococcal Vaccine: 50+ Years (1 of 1 - PCV) 04/02/2025 (Originally 08/21/2008)   Colonoscopy  12/04/2025   Bone Density Scan  Completed   Hepatitis C Screening  Completed   HIV  Screening  Completed   Zoster Vaccines- Shingrix   Completed   Hepatitis B Vaccines 19-59 Average Risk  Aged Out   Meningococcal B Vaccine  Aged Out   Lung Cancer Screening  Discontinued    EKG: normal EKG, normal sinus rhythm, unchanged from previous tracings     Assessment/Plan:  This is a routine wellness examination for Dejana.  Patient Care Team: Antonio Meth, Jamee SAUNDERS, DO as PCP - General Leora Lenis, MD as Consulting Physician (Plastic Surgery) Pietro Redell RAMAN, MD as Consulting Physician (Cardiology) [place], Tawni CROME, MD as Referring Physician (Dermatology)  I have personally reviewed and noted the following in the patients chart:   Medical and social history Use of alcohol, tobacco or illicit drugs  Current medications and supplements including opioid prescriptions. Functional ability and status Nutritional status Physical activity Advanced directives List of other physicians Hospitalizations, surgeries, and ER visits in previous 12 months Vitals Screenings to include cognitive, depression, and falls Referrals and appointments  Orders Placed This Encounter  Procedures   CBC with Differential/Platelet   Comprehensive metabolic panel with GFR   Lipid panel   TSH   Hemoglobin A1c   EKG 12-Lead   In addition, I have reviewed and discussed with patient certain preventive protocols, quality metrics, and best practice recommendations. A written personalized care plan for preventive services as well as general preventive health recommendations were provided to patient. Assessment and Plan Assessment & Plan Adult Wellness Visit   A 63 year old female attended a routine adult wellness visit. Her blood pressure is well-controlled. There are no new family history or surgeries, memory issues, or depression. Vision is 20/20 with glasses. She engages in regular exercise. She has not received flu or pneumonia vaccinations. Discussed the pneumonia vaccine, a one-time  shot for those over 50 unless conditions like asthma or diabetes require more frequent administration. Performed EKG and ordered labs as part of the welcome to Medicare visit. Considered administering the pneumonia vaccine.  Essential hypertension   Hypertension is well-controlled with the current medication regimen.  Hypothyroidism   Well-managed with the current medication regimen.  Anxiety disorder   Managed with Xanax . No current issues reported.    Tamiah Dysart R Lowne Chase, DO   04/18/2024   No follow-ups on file.

## 2024-04-02 NOTE — Assessment & Plan Note (Signed)
 Ghm utd Check labs  See AVS Health Maintenance  Topic Date Due   Medicare Annual Wellness (AWV)  Never done   DTaP/Tdap/Td (2 - Td or Tdap) 12/21/2022   Mammogram  08/08/2023   COVID-19 Vaccine (3 - 2025-26 season) 01/08/2024   Influenza Vaccine  08/06/2024 (Originally 12/08/2023)   Pneumococcal Vaccine: 50+ Years (1 of 1 - PCV) 04/02/2025 (Originally 08/21/2008)   Colonoscopy  12/04/2025   Bone Density Scan  Completed   Hepatitis C Screening  Completed   HIV Screening  Completed   Zoster Vaccines- Shingrix   Completed   Hepatitis B Vaccines 19-59 Average Risk  Aged Out   Meningococcal B Vaccine  Aged Out   Lung Cancer Screening  Discontinued

## 2024-04-17 ENCOUNTER — Ambulatory Visit: Payer: Self-pay | Admitting: Family Medicine

## 2024-04-25 DIAGNOSIS — M170 Bilateral primary osteoarthritis of knee: Secondary | ICD-10-CM | POA: Diagnosis not present

## 2024-05-02 ENCOUNTER — Other Ambulatory Visit: Payer: Self-pay | Admitting: Family Medicine

## 2024-05-02 DIAGNOSIS — I10 Essential (primary) hypertension: Secondary | ICD-10-CM

## 2024-05-12 ENCOUNTER — Other Ambulatory Visit: Payer: Self-pay | Admitting: Family Medicine

## 2024-05-12 DIAGNOSIS — E039 Hypothyroidism, unspecified: Secondary | ICD-10-CM

## 2024-05-27 ENCOUNTER — Other Ambulatory Visit: Payer: Self-pay | Admitting: Family Medicine

## 2024-05-27 DIAGNOSIS — F419 Anxiety disorder, unspecified: Secondary | ICD-10-CM

## 2024-05-28 NOTE — Telephone Encounter (Signed)
 Requesting: alprazolam  0.25mg   Contract: 11/17/22 UDS:  10/20/23 Last Visit: 04/02/24 Next Visit: None Last Refill: 03/26/24 #60 and 1RF   Please Advise
